# Patient Record
Sex: Male | Born: 1960 | Race: Black or African American | Hispanic: No | Marital: Single | Smoking: Former smoker
Health system: Southern US, Community
[De-identification: ages and names within clinical notes are randomized; demographics above are authoritative.]

## PROBLEM LIST (undated history)

## (undated) DIAGNOSIS — F329 Major depressive disorder, single episode, unspecified: Secondary | ICD-10-CM

## (undated) DIAGNOSIS — C189 Malignant neoplasm of colon, unspecified: Secondary | ICD-10-CM

## (undated) DIAGNOSIS — K219 Gastro-esophageal reflux disease without esophagitis: Secondary | ICD-10-CM

## (undated) DIAGNOSIS — I10 Essential (primary) hypertension: Secondary | ICD-10-CM

## (undated) DIAGNOSIS — F32A Depression, unspecified: Secondary | ICD-10-CM

## (undated) DIAGNOSIS — G4730 Sleep apnea, unspecified: Secondary | ICD-10-CM

## (undated) DIAGNOSIS — G629 Polyneuropathy, unspecified: Secondary | ICD-10-CM

## (undated) DIAGNOSIS — J45909 Unspecified asthma, uncomplicated: Secondary | ICD-10-CM

## (undated) DIAGNOSIS — J449 Chronic obstructive pulmonary disease, unspecified: Secondary | ICD-10-CM

## (undated) DIAGNOSIS — M25569 Pain in unspecified knee: Secondary | ICD-10-CM

## (undated) DIAGNOSIS — E669 Obesity, unspecified: Secondary | ICD-10-CM

## (undated) DIAGNOSIS — M549 Dorsalgia, unspecified: Secondary | ICD-10-CM

## (undated) DIAGNOSIS — R0600 Dyspnea, unspecified: Secondary | ICD-10-CM

## (undated) DIAGNOSIS — M1990 Unspecified osteoarthritis, unspecified site: Secondary | ICD-10-CM

## (undated) DIAGNOSIS — G8929 Other chronic pain: Secondary | ICD-10-CM

## (undated) HISTORY — PX: BOWEL RESECTION: SHX1257

## (undated) HISTORY — PX: ARTHROSCOPY WITH ANTERIOR CRUCIATE LIGAMENT (ACL) REPAIR WITH ANTERIOR TIBILIAS GRAFT: SHX6503

## (undated) HISTORY — DX: Malignant neoplasm of colon, unspecified: C18.9

## (undated) SURGERY — Surgical Case
Anesthesia: *Unknown

---

## 2014-08-05 ENCOUNTER — Ambulatory Visit: Payer: Self-pay | Admitting: Family Medicine

## 2014-08-18 ENCOUNTER — Ambulatory Visit: Payer: Self-pay | Admitting: Family Medicine

## 2015-06-06 ENCOUNTER — Encounter (HOSPITAL_COMMUNITY): Payer: Self-pay | Admitting: *Deleted

## 2015-06-06 ENCOUNTER — Emergency Department (HOSPITAL_COMMUNITY)
Admission: EM | Admit: 2015-06-06 | Discharge: 2015-06-06 | Disposition: A | Discharge status: Home / Self Care | Payer: Self-pay | Attending: Emergency Medicine | Admitting: Emergency Medicine

## 2015-06-06 DIAGNOSIS — Z720 Tobacco use: Secondary | ICD-10-CM | POA: Insufficient documentation

## 2015-06-06 DIAGNOSIS — K602 Anal fissure, unspecified: Secondary | ICD-10-CM | POA: Insufficient documentation

## 2015-06-06 MED ORDER — DOCUSATE SODIUM 100 MG PO CAPS: 100.0000 mg | ORAL_CAPSULE | Freq: Two times a day (BID) | ORAL | Status: DC

## 2015-06-06 MED ORDER — LIDOCAINE HCL 4 % EX SOLN
Freq: Three times a day (TID) | CUTANEOUS | Status: DC | PRN
  Administered 2015-06-06: 08:00:00 via TOPICAL
  Filled 2015-06-06: qty 50

## 2015-06-06 NOTE — ED Notes (Signed)
The pt has rectal pain after having a bm this am.  He felt swelling in his rectum when he wiped.  Difficulty walking

## 2015-06-06 NOTE — ED Provider Notes (Signed)
CSN: [id number]     Arrival date & time 06/06/15  2263 History   First MD Initiated Contact with Patient 06/06/15 [phone]     Chief Complaint  Patient presents with  . Rectal Pain     (Consider location/radiation/quality/duration/timing/severity/associated sxs/prior Treatment) HPI Complains of rectal pain onset yesterday, gradual pain is exacerbated by having bowel movement. He feels that his rectum is swollen. Pain is also worse with sitting improved when he takes pressure off the area. No other associated symptoms. No treatment prior to coming here. Denies blood per rectum no fever no abdominal pain no other associated symptoms . Denies blood per rectum no fever no abdominal pain no other associated symptoms History reviewed. No pertinent past medical history. past medical history colon cancer History reviewed. No pertinent past surgical history. No family history on file. Social History  Substance Use Topics  . Smoking status: Current Every Day Smoker  . Smokeless tobacco: None  . Alcohol Use: Yes    positive marijuana use Review of Systems  Gastrointestinal: Positive for rectal pain.      Allergies  Review of patient's allergies indicates no known allergies.  Home Medications   Prior to Admission medications   Not on File   BP 171/101 mmHg  Pulse 100  Temp(Src) 98.2 F (36.8 C) (Oral)  Resp 20  SpO2 95% Physical Exam  Constitutional: He appears well-developed and well-nourished. He appears distressed.  Appears uncomfortable  HENT:  Head: Normocephalic and atraumatic.  Eyes: Conjunctivae are normal. Pupils are equal, round, and reactive to light.  Neck: Neck supple. No tracheal deviation present. No thyromegaly present.  Cardiovascular: Normal rate and regular rhythm.   No murmur heard. Pulmonary/Chest: Effort normal and breath sounds normal.  Abdominal: Soft. Bowel sounds are normal. He exhibits no distension. There is no tenderness.  Genitourinary: Penis normal.   Anal fissure present at 12:00. Exquisitely tender. No surrounding redness or swelling  Musculoskeletal: Normal range of motion. He exhibits no edema or tenderness.  Neurological: He is alert. Coordination normal.  Skin: Skin is warm and dry. No rash noted.  Psychiatric: He has a normal mood and affect.  Nursing note and vitals reviewed.   ED Course  Procedures (including critical care time) Labs Review Labs Reviewed - No data to display  Imaging Review No results found. I have personally reviewed and evaluated these images and lab results as part of my medical decision-making.   EKG Interpretation None     7:50 AM feels much improved after treatment with topical lidocaine to anus. Feels ready to go home MDM  Plan referral Olinda, resource guide get primary care physician.  lidocaine bottle to go to use 3 times a day Final diagnoses:  None   prescription [facility] Diagnosis anal fissure     Orlie Dakin, MD 06/06/15 [phone]

## 2015-06-06 NOTE — ED Notes (Signed)
EDP at bedside  

## 2015-06-06 NOTE — Discharge Instructions (Signed)
Anal Fissure, Adult Apply lidocaine gel to your anus 3 times daily as needed for pain. Sit in warm bathtub 4 times daily for 30 minutes minutes at a time. Call the [place] or any of the numbers on the resource guide in 2 days to get a primary care physician and to be seen if not better by next week   An anal fissure is a small tear or crack in the skin around the anus. Bleeding from a fissure usually stops on its own within a few minutes. However, bleeding will often reoccur with each bowel movement until the crack heals.  CAUSES   Passing large, hard stools.  Frequent diarrheal stools.  Constipation.  Inflammatory bowel disease (Crohn's disease or ulcerative colitis).  Infections.  Anal sex. SYMPTOMS   Small amounts of blood seen on your stools, on toilet paper, or in the toilet after a bowel movement.  Rectal bleeding.  Painful bowel movements.  Itching or irritation around the anus. DIAGNOSIS Your caregiver will examine the anal area. An anal fissure can usually be seen with careful inspection. A rectal exam may be performed and a short tube (anoscope) may be used to examine the anal canal. TREATMENT   You may be instructed to take fiber supplements. These supplements can soften your stool to help make bowel movements easier.  Sitz baths may be recommended to help heal the tear. Do not use soap in the sitz baths.  A medicated cream or ointment may be prescribed to lessen discomfort. HOME CARE INSTRUCTIONS   Maintain a diet high in fruits, whole grains, and vegetables. Avoid constipating foods like bananas and dairy products.  Take sitz baths as directed by your caregiver.  Drink enough fluids to keep your urine clear or pale yellow.  Only take over-the-counter or prescription medicines for pain, discomfort, or fever as directed by your caregiver. Do not take aspirin as this may increase bleeding.  Do not use ointments containing  numbing medications (anesthetics) or hydrocortisone. They could slow healing. SEEK MEDICAL CARE IF:   Your fissure is not completely healed within 3 days.  You have further bleeding.  You have a fever.  You have diarrhea mixed with blood.  You have pain.  Your problem is getting worse rather than better. MAKE SURE YOU:   Understand these instructions.  Will watch your condition.  Will get help right away if you are not doing well or get worse. Document Released: 09/12/2005 Document Revised: 12/05/2011 Document Reviewed: 02/27/2011 [facility] Patient Information 2015 [place], [place]. This information is not intended to replace advice given to you by your health care provider. Make sure you discuss any questions you have with your health care provider.  Emergency Department Resource Guide 1) Find a Doctor and Pay Out of Pocket Although you won't have to find out who is covered by your insurance plan, it is a good idea to ask around and get recommendations. You will then need to call the office and see if the doctor you have chosen will accept you as a new patient and what types of options they offer for patients who are self-pay. Some doctors offer discounts or will set up payment plans for their patients who do not have insurance, but you will need to ask so you aren't surprised when you get to your appointment.  2) Contact Your Local Health Department Not all health departments have doctors that can see patients for sick visits, but many do, so it  is worth a call to see if yours does. If you don't know where your local health department is, you can check in your phone book. The CDC also has a tool to help you locate your state's health department, and many state websites also have listings of all of their local health departments.  3) Find a [facility] If your illness is not likely to be very severe or complicated, you may want to try a walk in clinic. These are popping up all over  the country in pharmacies, drugstores, and shopping centers. They're usually staffed by nurse practitioners or physician assistants that have been trained to treat common illnesses and complaints. They're usually fairly quick and inexpensive. However, if you have serious medical issues or chronic medical problems, these are probably not your best option.  No Primary Care Doctor: - Call Health Connect at  [phone] - they can help you locate a primary care doctor that  accepts your insurance, provides certain services, etc. - Physician Referral Service- [phone]  Chronic Pain Problems: Organization         Address  Phone   Notes  [facility]  [phone] Patients need to be referred by their primary care doctor.   Medication Assistance: Organization         Address  Phone   Notes  [facility] Medication [facility] [place] [place], [place] [address] [phone] --Must be a resident of [facility] -- Must have NO insurance coverage whatsoever (no Medicaid/ Medicare, etc.) -- The pt. MUST have a primary care doctor that directs their care regularly and follows them in the community   Med[facility]ist  [phone]   [employer]  [phone]    Agencies that provide inexpensive medical care: Organization         Address  Phone   Notes  [place]  [phone]   [facility]    [phone]   [facility] [place],  [address] [phone]   [place] [address], [place] [phone]   Planned Parenthood    [phone]   [facility]    [phone]   [place] and [place] [address], [place] Phone:  [phone], Fax:  [phone] Hours of Operation:  9 am - 6 pm, M-F.  Also accepts Medicaid/Medicare and self-pay.  [facility] [place],  [address], [place] Phone: [phone], Fax: [phone]. Hours of Operation:  8:30 am - 5:30 pm, M-F.  Also accepts Medicaid and self-pay.  [facility] [place] [address] [place] Phone: [phone]   [place], [place], [place] [phone], Ext. [phone] Mondays & Thursdays: 7-9 AM.  First 15 patients are seen on a first come, first serve basis.    [place] Providers:  Organization         Address  Phone   Notes  [facility] [address], [place] [phone] Also accepts self-pay patients.  [place], [place]  [phone]   [place], [address], [place] [phone]   [place] [address], [place] [phone]   Lucianne Lei [address]  [address], [place]   [phone] Only accepts [place] patients after they have their name applied to their card.   Self-Pay (no insurance) in [facility]:  Organization         Address  Phone   Notes  Sickle Cell Patients, [facility] [place] [phone]   [facility] Urgent Care [place] [phone]   [facility] [place]  [place], [place], [place] [phone]   [facility]/Dr. Osei-Bonsu  [address] [place] or [address], [place] [phone] Phone number for both [place] and [place] locations is the same.  Urgent Medical and [facility] [address], [place] [phone]   [facility] [address] [place] or [address] [phone]   [facility] [address] [place] [phone], phone; [phone], fax Sees patients 1st and 3rd Saturday of every month.   Must not qualify for public or private insurance (i.e. Medicaid, Medicare, [place] Health Choice, Veterans' Benefits)  Household income should be no more than 200% of the poverty level The clinic cannot treat you if you are pregnant or think you are pregnant  Sexually transmitted diseases are not treated at the clinic.    Dental Care: Organization         Address  Phone  Notes  [facility] Department of [facility] [place] [phone] Accepts children up to age 3 who are enrolled in [place] or [place]; pregnant women with a Medicaid card; and children who have applied for Medicaid or [place] Health Choice, but were declined, whose parents can pay a reduced fee at time of service.  [facility] Department of [facility]  [address], [place] [phone] Accepts children up to age 54 who are enrolled in [place] or [place]; pregnant women with a Medicaid card; and children who have applied for Medicaid or  Health Choice, but were declined, whose parents can pay a reduced fee at time of service.  [place] Adult Dental Access PROGRAM  [place] [phone] Patients are seen by appointment only. Walk-ins are not accepted. [place] will see patients 65 years of age and older. Monday - Tuesday (8am-5pm) Most Wednesdays (8:30-5pm) $30 per visit, cash only  [facility] Adult Dental Access PROGRAM  [address], [facility] [phone] Patients are seen by appointment only. Walk-ins are not accepted. [place] will see patients 61 years of age and older. One Wednesday Evening (Monthly: Volunteer Based).  $30 per visit, cash only  [place]  [phone] for adults; Children under age 23, call Graduate Pediatric Dentistry at [phone]. Children aged 43-14, please call [phone] to request a pediatric application.  Dental services are  provided in all areas of dental care including fillings, crowns and bridges, complete and partial dentures, implants, gum treatment, root canals, and extractions. Preventive care is also provided. Treatment is provided to both adults and children. Patients are selected via a lottery and there is often a waiting list.   [facility] [address], [place]  [phone] [email or web address]   [facility] [address] [place], [place] [phone], Ext. [phone] Second and Fourth Thursday  of each month, opens at 6:30 AM; Clinic ends at 9 AM.  Patients are seen on a first-come first-served basis, and a limited number are seen during each clinic.   [facility]  [address] Hillard Danker [place], [place] [phone]   Eligibility Requirements You must have lived in [place], [place], or [place] counties for at least the last three months.   You cannot be eligible for state or federal sponsored [employer], including [employer], [place], or [employer].   You generally cannot be eligible for healthcare insurance through your employer.    How to apply: Eligibility screenings are held every Tuesday and Wednesday afternoon from 1:00 pm until 4:00 pm. You do not need an appointment for the interview!  [facility] [address] [place], [place]   [place]  [place] Department  [place]  [phone]    Behavioral Health Resources in the Community: Intensive Outpatient Programs Organization         Address  Phone  Notes  [place]. [address] [place], [place] [phone]   [facility] Outpatient [address] [place], [place]   ADS: [facility] Drug [facility] [address] [place], [place]   [place] [address],  [place], [place] or [phone]   Substance Abuse Resources Organization         Address  Phone  Notes  Alcohol and Drug Services  [phone]   [place]  [phone]   [facility] [place]   Chinita Pester  [phone]   Residential & Outpatient Substance Abuse Program  [phone]   Psychological Services Organization         Address  Phone  Notes  [facility] [place]  [place]  [phone]   [place] [address] [place] or [phone]    Mobile Crisis Teams Organization         Address  Phone  Notes  [facility], Mobile Crisis Care Unit  [phone]   [facility]  [address] [place], [place]   [place] Levels [address], [place] [phone]    Self-Help/Support Groups Organization         Address  Phone             Notes  [place]. of [place] - variety of support groups  [place] Call for more information  Narcotics Anonymous (NA), [facility] [address], [employer] [place]  2 meetings at this location   Special educational needs teacher         Address  Phone  Notes  [facility] [place],    [place]  [phone]   [facility]  [address] [place] [address], [place], [place]   [place], [place] [phone] Admissions: 8am-3pm M-F  Incentives Substance [place] [address],    [place], [place] [phone]   [facility] [address] Jadene Pierini [place], [place]   The [place].,  [place], [place] - Intensive Outpatient [address] Kristeen [address], [place], [place]   [facility] ([place],  [place], [place] [phone] or  [phone]   [facility] Services (RTS) [address] [place], [place] Accepts Medicaid  [facility] [place] [address]  [place] [phone] Substance Abuse/Addiction Treatment   [facility] Organization         Address  Phone  Notes  [facility]  [phone]   Domenic Schwab, PhD [address] Arlis Porta [place], [place]   [phone] or [phone]   [place], [place] [phone]   [address], [place], [place] [phone] Insurance/Medicaid/sponsorship through [facility] and Families [address]                                    [place], [place] [phone] [place] [address], [place] [phone]    Dr. Adele Schilder  [phone]   [facility] Dept. 1) [address], [place] 2) [place] 3)  [place] [address], [place] [phone]  [phone]   [place] [phone] or [phone] (After Hours)

## 2015-08-25 DIAGNOSIS — J4540 Moderate persistent asthma, uncomplicated: Secondary | ICD-10-CM

## 2015-08-25 NOTE — Congregational Nurse Program (Signed)
Congregational Nurse Program Note  Date of Encounter: 08/25/2015  Past Medical History: No past medical history on file.  Encounter Details:     CNP Questionnaire - 08/25/15 1625    Patient Demographics   Is this a new or existing patient? New   Patient is considered a/an Not Applicable   Patient Assistance   Patient's financial/insurance status Uninsured;Low Income   Patient referred to apply for the following financial assistance [employer] insecurities addressed Provided food supplies   Transportation assistance No   Assistance securing medications No   Educational health offerings Not Applicable   Encounter Details   Primary purpose of visit Chronic Illness/Condition Visit   Was an Emergency Department visit averted? Not Applicable   Does patient have a medical provider? Yes   Patient referred to Clinic   Was a mental health screening completed? (GAINS tool) No   Does patient have dental issues? No   Since previous encounter, have you referred patient for abnormal blood pressure that resulted in a new diagnosis or medication change? No   Since previous encounter, have you referred patient for abnormal blood glucose that resulted in a new diagnosis or medication change? No   For Abstraction Use Only   Does patient have insurance? No    Clinic visit.  Patient asked if I could supply "cough syrup".   Informed him I could not.  States sees NP Audrea Muscat Placey at the [facility].  He had a "breathing treatment" this morning and will return to see her tomorrow.  Instructed him to discuss with her what she wanted to give him for the cough.

## 2015-09-01 DIAGNOSIS — I10 Essential (primary) hypertension: Secondary | ICD-10-CM

## 2015-09-07 NOTE — Congregational Nurse Program (Signed)
Congregational Nurse Program Note  Date of Encounter: 09/01/2015  Past Medical History: No past medical history on file.  Encounter Details:     CNP Questionnaire - 09/01/15 1425    Patient Demographics   Is this a new or existing patient? Existing   Patient is considered a/an Not Applicable   Patient Assistance   Patient's financial/insurance status Uninsured;Low Income   Patient referred to apply for the following financial assistance [employer] insecurities addressed Provided food supplies   Transportation assistance No   Assistance securing medications No   Educational health offerings Not Applicable   Encounter Details   Primary purpose of visit Chronic Illness/Condition Visit   Was an Emergency Department visit averted? Not Applicable   Does patient have a medical provider? Yes   Patient referred to Clinic   Was a mental health screening completed? (GAINS tool) No   Does patient have dental issues? No   Since previous encounter, have you referred patient for abnormal blood pressure that resulted in a new diagnosis or medication change? No   Since previous encounter, have you referred patient for abnormal blood glucose that resulted in a new diagnosis or medication change? No   For Abstraction Use Only   Does patient have insurance? No     Clinic visit as f/u from being seen by Marliss Coots [facility] at [facility].  He states he has been dx with bronchitis and that his B/P was "high" at that visit.  Instructed to return to clinic on the 8th for a re-check.  Has appointment with Ms. Synthia Innocent on the 12th.

## 2015-09-17 DIAGNOSIS — I10 Essential (primary) hypertension: Secondary | ICD-10-CM

## 2015-09-17 NOTE — Congregational Nurse Program (Signed)
Congregational Nurse Program Note  Date of Encounter: 09/17/2015  Past Medical History: No past medical history on file.  Encounter Details:     CNP Questionnaire - 09/17/15 1352    Patient Demographics   Is this a new or existing patient? Existing   Patient is considered a/an Not Applicable   Patient Assistance   Patient's financial/insurance status Uninsured;Low Income   Patient referred to apply for the following financial assistance [employer] insecurities addressed Provided food supplies   Transportation assistance No   Assistance securing medications Yes   Educational health offerings Not Applicable   Encounter Details   Primary purpose of visit Chronic Illness/Condition Visit;Navigating the Healthcare System   Was an Emergency Department visit averted? Not Applicable   Does patient have a medical provider? Yes   Patient referred to Clinic   Was a mental health screening completed? (GAINS tool) No   Does patient have dental issues? No   Since previous encounter, have you referred patient for abnormal blood pressure that resulted in a new diagnosis or medication change? No   Since previous encounter, have you referred patient for abnormal blood glucose that resulted in a new diagnosis or medication change? No   For Abstraction Use Only   Does patient have insurance? No    Clinic note:  Requested B/P check.  States still has not received medications from the [facility].  TC NP at the [facility].  She reported client had been seen in the clinic for asthma.  His elevated B/P is a new symptom and she was monitoring for one month to assess.  Client was instructed to apply for the medication assistance program which he has not followed through.  Client was to wait in the lobby until I talked with the NP to give him directions.  He had left the building when I went to talk to him.  He is to return to shelter clinic weekly for B/P check.  Results are to be reported to the NP at  the [facility].

## 2015-10-13 DIAGNOSIS — I10 Essential (primary) hypertension: Secondary | ICD-10-CM

## 2015-10-15 NOTE — Congregational Nurse Program (Signed)
Congregational Nurse Program Note  Date of Encounter: 10/13/2015  Past Medical History: No past medical history on file.  Encounter Details:     CNP Questionnaire - 10/13/15 1504    Patient Demographics   Is this a new or existing patient? Existing   Patient is considered a/an Not Applicable   Race Other   Patient Assistance   Location of Patient Assistance Not Applicable   Patient's financial/insurance status Self-Pay;Low Income   Uninsured Patient Yes   Interventions Counseled to make appt. with provider   Patient referred to apply for the following financial assistance [employer] insecurities addressed Provided food supplies   Transportation assistance No   Assistance securing medications Yes   Educational health offerings Cardiac disease   Encounter Details   Primary purpose of visit Chronic Illness/Condition Visit;Education/Health Concerns;Spiritual Care/Support Visit   Was an Emergency Department visit averted? Not Applicable   Does patient have a medical provider? Yes   Patient referred to [place]   Was a mental health screening completed? (GAINS tool) No   Does patient have dental issues? No   Since previous encounter, have you referred patient for abnormal blood pressure that resulted in a new diagnosis or medication change? No   Since previous encounter, have you referred patient for abnormal blood glucose that resulted in a new diagnosis or medication change? No   For Abstraction Use Only   Does patient have insurance? No       B/P check.  Still has not obtained medications.  States has an appointment with MAP to apply for medication assistance.  Encouraged him to keep that appointment

## 2015-11-26 DIAGNOSIS — I10 Essential (primary) hypertension: Secondary | ICD-10-CM

## 2015-12-01 NOTE — Congregational Nurse Program (Signed)
Congregational Nurse Program Note  Date of Encounter: 11/26/2015  Past Medical History: No past medical history on file.  Encounter Details:     CNP Questionnaire - 11/26/15 1439    Patient Demographics   Is this a new or existing patient? Existing   Patient is considered a/an Not Applicable   Race Other   Patient Assistance   Location of Patient Assistance Not Applicable   Patient's financial/insurance status Self-Pay;Low Income   Uninsured Patient Yes   Interventions Counseled to make appt. with provider;Assisted patient in making appt.   Patient referred to apply for the following financial assistance [employer] insecurities addressed Provided food supplies   Transportation assistance No   Assistance securing medications No   Educational health offerings Cardiac disease   Encounter Details   Primary purpose of visit Chronic Illness/Condition Visit;Education/Health Concerns;Spiritual Care/Support Visit   Was an Emergency Department visit averted? Not Applicable   Does patient have a medical provider? Yes   Patient referred to Clinic   Was a mental health screening completed? (GAINS tool) No   Does patient have dental issues? No   Does patient have vision issues? No   Since previous encounter, have you referred patient for abnormal blood pressure that resulted in a new diagnosis or medication change? No   Since previous encounter, have you referred patient for abnormal blood glucose that resulted in a new diagnosis or medication change? No   For Abstraction Use Only   Does patient have insurance? No       Client is seeing Marliss Coots RN NP at the [facility].  Directives given by Ms Synthia Innocent for client to [facility] weekly for B/P check.  Results will be monitored for one month.  Client instructed.

## 2015-12-08 DIAGNOSIS — I10 Essential (primary) hypertension: Secondary | ICD-10-CM

## 2015-12-08 NOTE — Congregational Nurse Program (Signed)
Congregational Nurse Program Note  Date of Encounter: 12/08/2015  Past Medical History: No past medical history on file.  Encounter Details:     CNP Questionnaire - 12/08/15 1530    Patient Demographics   Is this a new or existing patient? Existing   Patient is considered a/an Not Applicable   Race Other   Patient Assistance   Location of Patient Assistance Not Applicable   Patient's financial/insurance status Self-Pay;Low Income   Uninsured Patient Yes   Interventions Counseled to make appt. with provider;Assisted patient in making appt.   Patient referred to apply for the following financial assistance [employer] insecurities addressed Provided food supplies   Transportation assistance No   Assistance securing medications No   Educational health offerings Cardiac disease   Encounter Details   Primary purpose of visit Chronic Illness/Condition Visit;Education/Health Concerns;Spiritual Care/Support Visit   Was an Emergency Department visit averted? Not Applicable   Does patient have a medical provider? Yes   Patient referred to Clinic   Was a mental health screening completed? (GAINS tool) No   Does patient have dental issues? No   Does patient have vision issues? No   Since previous encounter, have you referred patient for abnormal blood pressure that resulted in a new diagnosis or medication change? No   Since previous encounter, have you referred patient for abnormal blood glucose that resulted in a new diagnosis or medication change? No   For Abstraction Use Only   Does patient have insurance? No       B/P check  128/78.  Much improved.  Taking blood pressure medication

## 2016-01-19 DIAGNOSIS — Z0000 Encounter for general adult medical examination without abnormal findings: Secondary | ICD-10-CM

## 2016-01-19 NOTE — Congregational Nurse Program (Signed)
Congregational Nurse Program Note  Date of Encounter: 01/19/2016  Past Medical History: No past medical history on file.  Encounter Details:     CNP Questionnaire - 01/19/16 2227    Patient Demographics   Is this a new or existing patient? New   Race African-American/Black   Patient Assistance   Location of Patient Assistance Not Applicable   Patient's financial/insurance status [employer]   Uninsured Patient Yes   Interventions Counseled to make appt. with provider   Patient referred to apply for the following financial assistance Not Applicable   Food insecurities addressed Not Applicable   Transportation assistance No   Assistance securing medications No   Educational health offerings Navigating the healthcare system   Encounter Details   Primary purpose of visit Acute Illness/Condition Visit;Spiritual Care/Support Visit;Education/Health Concerns   Was an Emergency Department visit averted? Not Applicable   Does patient have a medical provider? Yes   Patient referred to [place];Follow up with established PCP   Was a mental health screening completed? (GAINS tool) No   Does patient have dental issues? No   Does patient have vision issues? No   Since previous encounter, have you referred patient for abnormal blood pressure that resulted in a new diagnosis or medication change? No   Since previous encounter, have you referred patient for abnormal blood glucose that resulted in a new diagnosis or medication change? No   For Abstraction Use Only   Does patient have insurance? No    Initial  Visit  for his  Client who  [facility] at [facility]. Concerned about ringing in his  Ears . States  He  [facility] from NP at  [facility]. Client will  Go on tomorrow for check of his ears..Wgt 226 lbs.

## 2016-02-15 ENCOUNTER — Encounter (HOSPITAL_COMMUNITY): Payer: Self-pay | Admitting: Emergency Medicine

## 2016-02-15 ENCOUNTER — Emergency Department (HOSPITAL_COMMUNITY): Payer: Self-pay

## 2016-02-15 ENCOUNTER — Emergency Department (HOSPITAL_COMMUNITY)
Admission: EM | Admit: 2016-02-15 | Discharge: 2016-02-16 | Disposition: A | Discharge status: Home / Self Care | Payer: Self-pay | Attending: Emergency Medicine | Admitting: Emergency Medicine

## 2016-02-15 DIAGNOSIS — J45901 Unspecified asthma with (acute) exacerbation: Secondary | ICD-10-CM | POA: Insufficient documentation

## 2016-02-15 DIAGNOSIS — R0600 Dyspnea, unspecified: Secondary | ICD-10-CM

## 2016-02-15 DIAGNOSIS — I10 Essential (primary) hypertension: Secondary | ICD-10-CM | POA: Insufficient documentation

## 2016-02-15 DIAGNOSIS — R140 Abdominal distension (gaseous): Secondary | ICD-10-CM | POA: Insufficient documentation

## 2016-02-15 DIAGNOSIS — F17200 Nicotine dependence, unspecified, uncomplicated: Secondary | ICD-10-CM | POA: Insufficient documentation

## 2016-02-15 DIAGNOSIS — R0789 Other chest pain: Secondary | ICD-10-CM | POA: Insufficient documentation

## 2016-02-15 DIAGNOSIS — R1013 Epigastric pain: Secondary | ICD-10-CM | POA: Insufficient documentation

## 2016-02-15 DIAGNOSIS — Z79899 Other long term (current) drug therapy: Secondary | ICD-10-CM | POA: Insufficient documentation

## 2016-02-15 HISTORY — DX: Unspecified asthma, uncomplicated: J45.909

## 2016-02-15 HISTORY — DX: Essential (primary) hypertension: I10

## 2016-02-15 LAB — COMPREHENSIVE METABOLIC PANEL
ALK PHOS: 77 U/L (ref 38–126)
ALT: 32 U/L (ref 17–63)
ANION GAP: 10 (ref 5–15)
AST: 29 U/L (ref 15–41)
Albumin: 3.8 g/dL (ref 3.5–5.0)
BILIRUBIN TOTAL: 0.4 mg/dL (ref 0.3–1.2)
BUN: 9 mg/dL (ref 6–20)
CALCIUM: 9.1 mg/dL (ref 8.9–10.3)
CO2: 22 mmol/L (ref 22–32)
CREATININE: 0.98 mg/dL (ref 0.61–1.24)
Chloride: 106 mmol/L (ref 101–111)
Glucose, Bld: 115 mg/dL — ABNORMAL HIGH (ref 65–99)
Potassium: 3.7 mmol/L (ref 3.5–5.1)
Sodium: 138 mmol/L (ref 135–145)
TOTAL PROTEIN: 7.1 g/dL (ref 6.5–8.1)

## 2016-02-15 LAB — I-STAT TROPONIN, ED: Troponin i, poc: 0 ng/mL (ref 0.00–0.08)

## 2016-02-15 LAB — CBC
HEMATOCRIT: 37.4 % — AB (ref 39.0–52.0)
HEMOGLOBIN: 12 g/dL — AB (ref 13.0–17.0)
MCH: 27.8 pg (ref 26.0–34.0)
MCHC: 32.1 g/dL (ref 30.0–36.0)
MCV: 86.6 fL (ref 78.0–100.0)
Platelets: 256 10*3/uL (ref 150–400)
RBC: 4.32 MIL/uL (ref 4.22–5.81)
RDW: 13.6 % (ref 11.5–15.5)
WBC: 9.7 10*3/uL (ref 4.0–10.5)

## 2016-02-15 MED ORDER — AZITHROMYCIN 250 MG PO TABS: 500.0000 mg | ORAL_TABLET | Freq: Once | ORAL | Status: DC

## 2016-02-15 MED ORDER — ONDANSETRON HCL 4 MG/2ML IJ SOLN
4.0000 mg | Freq: Once | INTRAMUSCULAR | Status: AC
  Administered 2016-02-15: 4 mg via INTRAVENOUS
  Filled 2016-02-15: qty 2

## 2016-02-15 MED ORDER — IBUPROFEN 200 MG PO TABS
600.0000 mg | ORAL_TABLET | Freq: Once | ORAL | Status: AC
  Administered 2016-02-15: 600 mg via ORAL
  Filled 2016-02-15: qty 1

## 2016-02-15 MED ORDER — ALBUTEROL SULFATE (2.5 MG/3ML) 0.083% IN NEBU
5.0000 mg | INHALATION_SOLUTION | Freq: Once | RESPIRATORY_TRACT | Status: AC
  Administered 2016-02-15: 5 mg via RESPIRATORY_TRACT
  Filled 2016-02-15: qty 6

## 2016-02-15 MED ORDER — METHYLPREDNISOLONE SODIUM SUCC 125 MG IJ SOLR: 125.0000 mg | Freq: Once | INTRAMUSCULAR | Status: DC

## 2016-02-15 MED ORDER — SODIUM CHLORIDE 0.9 % IV BOLUS (SEPSIS): 1000.0000 mL | Freq: Once | INTRAVENOUS | Status: DC

## 2016-02-15 MED ORDER — IOPAMIDOL (ISOVUE-370) INJECTION 76%
INTRAVENOUS | Status: AC
  Administered 2016-02-16: 100 mL
  Filled 2016-02-15: qty 100

## 2016-02-15 MED ORDER — AMOXICILLIN-POT CLAVULANATE 875-125 MG PO TABS: 1.0000 | ORAL_TABLET | Freq: Once | ORAL | Status: DC

## 2016-02-15 NOTE — ED Notes (Signed)
Patient transported to X-ray 

## 2016-02-15 NOTE — ED Notes (Signed)
Pt presents from home with [facility] for SOB x 3 days along with LEFT sided rib pain, diaphoresis, chills, and yellow sputum production; EMS reports being called out earlier today where one neb tx was administered and patient refused transport; pt called 911 again stating he did not feel better; pt received 2 albuterol nebs and 1 atrovent neb enroute by EMS and 125mg  solumedrol; pt hx of HTN with BP today of 180/120; pt reports improved WOB at this time

## 2016-02-15 NOTE — ED Notes (Signed)
Walked patient in the hallway to the bathroom in [place] and back...oxygen maintain at 92% and pulse 90-92

## 2016-02-15 NOTE — ED Provider Notes (Signed)
CSN: [id number]     Arrival date & time 02/15/16  1949 History   First MD Initiated Contact with Patient 02/15/16 1951     Chief Complaint  Patient presents with  . Shortness of Breath   HPI Comments: EMS saw this AM, felt better with neb.  Seen again and given solumedrol 125mg  and atrovent. Feeling better.    Patient is a 55 y.o. male presenting with shortness of breath.  Shortness of Breath Severity:  Moderate Onset quality:  Gradual Duration:  3 days Timing:  Constant Progression:  Worsening Chronicity:  New Context: URI   Context comment:  Nasal congestion, sore throat Relieved by: albuterol. Worsened by:  Activity and coughing Associated symptoms: chest pain (left chest wall, worse with palpation and cough. no exertional component), cough, diaphoresis, fever (subjective), sore throat, sputum production (yellow) and wheezing   Associated symptoms: no abdominal pain, no hemoptysis, no PND, no rash and no vomiting   Risk factors: obesity and tobacco use   Risk factors: no hx of cancer and no hx of PE/DVT   Risk factors comment:  HTN, asthma   Past Medical History  Diagnosis Date  . Hypertension   . Asthma    History reviewed. No pertinent past surgical history. History reviewed. No pertinent family history. Social History  Substance Use Topics  . Smoking status: Current Every Day Smoker  . Smokeless tobacco: None  . Alcohol Use: Yes    Review of Systems  Constitutional: Positive for fever (subjective), chills and diaphoresis. Negative for activity change and appetite change.  HENT: Positive for congestion, rhinorrhea and sore throat. Negative for trouble swallowing and voice change.   Respiratory: Positive for cough, sputum production (yellow), shortness of breath and wheezing. Negative for hemoptysis.   Cardiovascular: Positive for chest pain (left chest wall, worse with palpation and cough. no exertional component). Negative for PND.  Gastrointestinal: Negative for  nausea, vomiting and abdominal pain.  Genitourinary: Negative for dysuria and flank pain.  Musculoskeletal: Positive for myalgias. Negative for back pain.  Skin: Negative for rash.  Neurological: Negative for syncope.  Psychiatric/Behavioral: Negative for confusion.  All other systems reviewed and are negative.   Allergies  Review of patient's allergies indicates no known allergies.  Home Medications   Prior to Admission medications   Medication Sig Start Date End Date Taking? Authorizing Provider  amoxicillin-clavulanate (AUGMENTIN) 875-125 MG tablet Take 1 tablet by mouth 2 (two) times daily. 02/16/16 02/23/16  Tammy Sours, MD  azithromycin (ZITHROMAX) 250 MG tablet Take 1 tablet (250 mg total) by mouth daily. 02/16/16 02/21/16  Tammy Sours, MD  docusate sodium (COLACE) 100 MG capsule Take 1 capsule (100 mg total) by mouth every 12 (twelve) hours. 06/06/15   Orlie Dakin, MD   BP 139/90 mmHg  Pulse 78  Temp(Src) 98.6 F (37 C) (Oral)  Resp 19  SpO2 83% Physical Exam  Constitutional: He is oriented to person, place, and time. He appears well-developed and well-nourished. No distress.  Speaking in full sentences. Not diaphoretic but shirt is moist  HENT:  Head: Normocephalic and atraumatic.  Nose: Nose normal.  Eyes: Conjunctivae are normal.  Neck: Normal range of motion. Neck supple. No JVD present. No tracheal deviation present.  Cardiovascular: Normal rate, regular rhythm and normal heart sounds.   No murmur heard. Pulmonary/Chest: Effort normal. No respiratory distress. He has wheezes (rare insp and exp diffsuely). He has rales (left > right). He exhibits tenderness (reproduces chest pain).  Abdominal: Soft. Bowel sounds are  normal. He exhibits distension. He exhibits no mass. There is tenderness. There is no rebound and no guarding.  Musculoskeletal: Normal range of motion. He exhibits no edema.  No lower extremity edema, calf tenderness, warmth, erythema or palpable cords    Neurological: He is alert and oriented to person, place, and time.  Skin: Skin is warm and dry. No rash noted.  Psychiatric: He has a normal mood and affect.  Nursing note and vitals reviewed.   ED Course  Procedures (including critical care time) Labs Review Labs Reviewed  CBC - Abnormal; Notable for the following:    Hemoglobin 12.0 (*)    HCT 37.4 (*)    All other components within normal limits  COMPREHENSIVE METABOLIC PANEL - Abnormal; Notable for the following:    Glucose, Bld 115 (*)    All other components within normal limits  I-STAT TROPOININ, ED    Imaging Review Dg Chest 2 View  02/15/2016  CLINICAL DATA:  Cough, chest pain and left side rib pain since Saturday. Hx controlled HTN and asthma. EXAM: CHEST  2 VIEW COMPARISON:  None. FINDINGS: Midline trachea. Mild cardiomegaly. Mediastinal contours otherwise within normal limits. No pleural effusion or pneumothorax. Low lung volumes. Bibasilar volume loss with patchy airspace disease. Right hemidiaphragm elevation. Prominent right upper quadrant gas is likely within the hepatic flexure of colon. IMPRESSION: Hypoventilation with bibasilar volume loss and patchy airspace disease, favoring atelectasis. If symptoms warrant or persist, consider short term radiographic follow-up to exclude less likely pneumonia. Electronically Signed   By: Abigail Miyamoto M.D.   On: 02/15/2016 21:55   Ct Angio Chest Pe W/cm &/or Wo Cm  02/16/2016  CLINICAL DATA:  Hypoxia, chest pain and shortness of breath for 2 days. Abdominal pain. EXAM: CT ANGIOGRAPHY CHEST CT ABDOMEN AND PELVIS WITH CONTRAST TECHNIQUE: Multidetector CT imaging of the chest was performed using the standard protocol during bolus administration of intravenous contrast. Multiplanar CT image reconstructions and MIPs were obtained to evaluate the vascular anatomy. Multidetector CT imaging of the abdomen and pelvis was performed using the standard protocol during bolus administration of  intravenous contrast. CONTRAST:  Two contrast injections to optimize assessment of the pulmonary arteries. 120 cc Isovue 370 total. COMPARISON:  Chest radiographs earlier this day. FINDINGS: CTA CHEST FINDINGS There are no filling defects within the pulmonary arteries to suggest pulmonary embolus. Normal caliber thoracic aorta without dissection. Small mediastinal and hilar lymph nodes not enlarged by size criteria. Scattered coronary artery calcifications. No pericardial effusion. Moderate emphysema. Central bronchial thickening. Clustered small nodular opacities in the anterior right upper lobe measuring 2 mm. No pulmonary mass. There are no acute or suspicious osseous abnormalities. CT ABDOMEN and PELVIS FINDINGS No focal hepatic lesion. Gallbladder is physiologically distended without stone. No peripancreatic inflammation or ductal dilatation. The spleen, adrenal glands, and kidneys are unremarkable. Small hiatal hernia. Stomach minimally distended with fluid. Fluid distending duodenum without wall thickening. There are no dilated or thickened bowel loops. Multiple enteric sutures in the colon. Gaseous distention of the right and proximal transverse colon with small volume of stool. Detailed bowel evaluation limited secondary to lack of enteric contrast. No colonic wall thickening. The appendix is not seen. No free air, free fluid, or intra-abdominal fluid collection. In the pelvis the bladder is minimally distended without wall thickening. Prominent sized prostate gland. No pelvic free fluid. There are no acute or suspicious osseous abnormalities. Review of the MIP images confirms the above findings. IMPRESSION: CT CHEST IMPRESSION: 1. No pulmonary  embolus. 2. Moderate emphysema and central bronchial thickening. Bronchial thickening of uncertain acuity. 3. Clustered a small nodular opacities in the right upper lobe measuring 2 mm, have the appearance of infectious or inflammatory etiology. Given presence of  emphysema, however recommend follow-up. Non-contrast chest CT can be considered in 12 months if patient is high-risk. This recommendation follows the consensus statement: Guidelines for Management of Incidental Pulmonary Nodules Detected on CT Images:From the Fleischner Society 2017; published online before print (10.1148/radiol.IJ:2314499). CT ABDOMEN/PELVIS IMPRESSION: 1. Gaseous distention of colon in the right abdomen, no obstruction or wall thickening. Small stool throughout the remainder of colon with multiple colonic enteric chain sutures. 2. Otherwise no acute abnormality in the abdomen/pelvis. Electronically Signed   By: Jeb Levering M.D.   On: 02/16/2016 04:02   Ct Abdomen Pelvis W Contrast  02/16/2016  CLINICAL DATA:  Hypoxia, chest pain and shortness of breath for 2 days. Abdominal pain. EXAM: CT ANGIOGRAPHY CHEST CT ABDOMEN AND PELVIS WITH CONTRAST TECHNIQUE: Multidetector CT imaging of the chest was performed using the standard protocol during bolus administration of intravenous contrast. Multiplanar CT image reconstructions and MIPs were obtained to evaluate the vascular anatomy. Multidetector CT imaging of the abdomen and pelvis was performed using the standard protocol during bolus administration of intravenous contrast. CONTRAST:  Two contrast injections to optimize assessment of the pulmonary arteries. 120 cc Isovue 370 total. COMPARISON:  Chest radiographs earlier this day. FINDINGS: CTA CHEST FINDINGS There are no filling defects within the pulmonary arteries to suggest pulmonary embolus. Normal caliber thoracic aorta without dissection. Small mediastinal and hilar lymph nodes not enlarged by size criteria. Scattered coronary artery calcifications. No pericardial effusion. Moderate emphysema. Central bronchial thickening. Clustered small nodular opacities in the anterior right upper lobe measuring 2 mm. No pulmonary mass. There are no acute or suspicious osseous abnormalities. CT ABDOMEN  and PELVIS FINDINGS No focal hepatic lesion. Gallbladder is physiologically distended without stone. No peripancreatic inflammation or ductal dilatation. The spleen, adrenal glands, and kidneys are unremarkable. Small hiatal hernia. Stomach minimally distended with fluid. Fluid distending duodenum without wall thickening. There are no dilated or thickened bowel loops. Multiple enteric sutures in the colon. Gaseous distention of the right and proximal transverse colon with small volume of stool. Detailed bowel evaluation limited secondary to lack of enteric contrast. No colonic wall thickening. The appendix is not seen. No free air, free fluid, or intra-abdominal fluid collection. In the pelvis the bladder is minimally distended without wall thickening. Prominent sized prostate gland. No pelvic free fluid. There are no acute or suspicious osseous abnormalities. Review of the MIP images confirms the above findings. IMPRESSION: CT CHEST IMPRESSION: 1. No pulmonary embolus. 2. Moderate emphysema and central bronchial thickening. Bronchial thickening of uncertain acuity. 3. Clustered a small nodular opacities in the right upper lobe measuring 2 mm, have the appearance of infectious or inflammatory etiology. Given presence of emphysema, however recommend follow-up. Non-contrast chest CT can be considered in 12 months if patient is high-risk. This recommendation follows the consensus statement: Guidelines for Management of Incidental Pulmonary Nodules Detected on CT Images:From the Fleischner Society 2017; published online before print (10.1148/radiol.IJ:2314499). CT ABDOMEN/PELVIS IMPRESSION: 1. Gaseous distention of colon in the right abdomen, no obstruction or wall thickening. Small stool throughout the remainder of colon with multiple colonic enteric chain sutures. 2. Otherwise no acute abnormality in the abdomen/pelvis. Electronically Signed   By: Jeb Levering M.D.   On: 02/16/2016 04:02   I have personally  reviewed and evaluated  these images and lab results as part of my medical decision-making.   EKG Interpretation   Date/Time:  Monday Feb 15 2016 20:32:05 EDT Ventricular Rate:  89 PR Interval:  152 QRS Duration: 91 QT Interval:  304 QTC Calculation: 370 R Axis:   44 Text Interpretation:  Sinus rhythm S1,S2,S3 pattern Probable anteroseptal  infarct, old No previous ECGs available Confirmed by YAO  MD, DAVID  ([id number]) on 02/15/2016 9:15:08 PM Also confirmed by Darl Householder  MD, DAVID ([id number]),  editor Gilford Rile, CCT, [place] ([id number])  on 02/16/2016 7:28:55 AM      MDM   Final diagnoses:  Dyspnea  Epigastric pain  Chest wall pain    Vital signs stable. Not hypoxic. Positive sputum cough subjective chills and URI-like symptoms. Suspect resp illness.  Will obtain CXR>  Chest x-ray with bibasilar atelectasis.  Large bubble in RUQ, free air? Not definitive on XR. Albuterol has not helped.    EKG wo ischemic changes or right heart strain.  Trop neg.    10:38 PM repeat exam and hx.  O2 sats 90.  Diaphoretic.  He now better described the chest pain as midepigastric sternal and vague. Free whether shortness of breath is related to diaphragmatic elevation. Given the saturations without clear source we'll obtain CT PE study as well as abdomen and pelvis to further evaluate. He believes his abdomen is more distended than normal. Last bowel movement this morning. Has not passed flatus since this event.  Dyspnea could be related to diaphragmatic compression seen on CXR>   Signed out to Dr Dina Rich at [facility] pending CT imaging.  His O2 sats are now appropriate.  Ambulated in ED without desaturation.    Tammy Sours, MD 02/16/16 [place] Yao, MD 02/17/16 Tresa Moore

## 2016-02-16 ENCOUNTER — Emergency Department (HOSPITAL_COMMUNITY): Payer: Self-pay

## 2016-02-16 ENCOUNTER — Encounter (HOSPITAL_COMMUNITY): Payer: Self-pay | Admitting: Adult Health

## 2016-02-16 DIAGNOSIS — Y9389 Activity, other specified: Secondary | ICD-10-CM | POA: Insufficient documentation

## 2016-02-16 DIAGNOSIS — F17200 Nicotine dependence, unspecified, uncomplicated: Secondary | ICD-10-CM | POA: Insufficient documentation

## 2016-02-16 DIAGNOSIS — Y998 Other external cause status: Secondary | ICD-10-CM | POA: Insufficient documentation

## 2016-02-16 DIAGNOSIS — I10 Essential (primary) hypertension: Secondary | ICD-10-CM | POA: Insufficient documentation

## 2016-02-16 DIAGNOSIS — X58XXXA Exposure to other specified factors, initial encounter: Secondary | ICD-10-CM | POA: Insufficient documentation

## 2016-02-16 DIAGNOSIS — J45909 Unspecified asthma, uncomplicated: Secondary | ICD-10-CM | POA: Insufficient documentation

## 2016-02-16 DIAGNOSIS — M940 Chondrocostal junction syndrome [Tietze]: Secondary | ICD-10-CM | POA: Insufficient documentation

## 2016-02-16 DIAGNOSIS — Y9289 Other specified places as the place of occurrence of the external cause: Secondary | ICD-10-CM | POA: Insufficient documentation

## 2016-02-16 MED ORDER — IOPAMIDOL (ISOVUE-370) INJECTION 76%
INTRAVENOUS | Status: DC
Start: 2016-02-16 — End: 2016-02-16
  Filled 2016-02-16: qty 100

## 2016-02-16 MED ORDER — AZITHROMYCIN 250 MG PO TABS: 250.0000 mg | ORAL_TABLET | Freq: Every day | ORAL | Status: AC

## 2016-02-16 MED ORDER — AMOXICILLIN-POT CLAVULANATE 875-125 MG PO TABS
1.0000 | ORAL_TABLET | Freq: Two times a day (BID) | ORAL | Status: AC
Start: 2016-02-16 — End: 2016-02-23

## 2016-02-16 NOTE — ED Provider Notes (Signed)
Patient signed out pending CT scan. CT scan negative for acute process.patient does have multiple patient does have multiple nodular opacities in the right upper lobe which could be infectious or inflammatory. Will discharge home on antibiotics. Repeat CT scan recommended in 12 months. This was discussed with the patient.  Patient was able to ambulate and maintain pulse ox.  Results for orders placed or performed during the hospital encounter of 02/15/16  CBC  Result Value Ref Range   WBC 9.7 4.0 - 10.5 K/uL   RBC 4.32 4.22 - 5.81 MIL/uL   Hemoglobin 12.0 (L) 13.0 - 17.0 g/dL   HCT 37.4 (L) 39.0 - 52.0 %   MCV 86.6 78.0 - 100.0 fL   MCH 27.8 26.0 - 34.0 pg   MCHC 32.1 30.0 - 36.0 g/dL   RDW 13.6 11.5 - 15.5 %   Platelets 256 150 - 400 K/uL  Comprehensive metabolic panel  Result Value Ref Range   Sodium 138 135 - 145 mmol/L   Potassium 3.7 3.5 - 5.1 mmol/L   Chloride 106 101 - 111 mmol/L   CO2 22 22 - 32 mmol/L   Glucose, Bld 115 (H) 65 - 99 mg/dL   BUN 9 6 - 20 mg/dL   Creatinine, Ser 0.98 0.61 - 1.24 mg/dL   Calcium 9.1 8.9 - 10.3 mg/dL   Total Protein 7.1 6.5 - 8.1 g/dL   Albumin 3.8 3.5 - 5.0 g/dL   AST 29 15 - 41 U/L   ALT 32 17 - 63 U/L   Alkaline Phosphatase 77 38 - 126 U/L   Total Bilirubin 0.4 0.3 - 1.2 mg/dL   GFR calc non Af Amer >60 >60 mL/min   GFR calc Af Amer >60 >60 mL/min   Anion gap 10 5 - 15  I-Stat Troponin, ED (not at [facility])  Result Value Ref Range   Troponin i, poc 0.00 0.00 - 0.08 ng/mL   Comment 3           Dg Chest 2 View  02/15/2016  CLINICAL DATA:  Cough, chest pain and left side rib pain since Saturday. Hx controlled HTN and asthma. EXAM: CHEST  2 VIEW COMPARISON:  None. FINDINGS: Midline trachea. Mild cardiomegaly. Mediastinal contours otherwise within normal limits. No pleural effusion or pneumothorax. Low lung volumes. Bibasilar volume loss with patchy airspace disease. Right hemidiaphragm elevation. Prominent right upper quadrant gas is likely  within the hepatic flexure of colon. IMPRESSION: Hypoventilation with bibasilar volume loss and patchy airspace disease, favoring atelectasis. If symptoms warrant or persist, consider short term radiographic follow-up to exclude less likely pneumonia. Electronically Signed   By: Abigail Miyamoto M.D.   On: 02/15/2016 21:55   Ct Angio Chest Pe W/cm &/or Wo Cm  02/16/2016  CLINICAL DATA:  Hypoxia, chest pain and shortness of breath for 2 days. Abdominal pain. EXAM: CT ANGIOGRAPHY CHEST CT ABDOMEN AND PELVIS WITH CONTRAST TECHNIQUE: Multidetector CT imaging of the chest was performed using the standard protocol during bolus administration of intravenous contrast. Multiplanar CT image reconstructions and MIPs were obtained to evaluate the vascular anatomy. Multidetector CT imaging of the abdomen and pelvis was performed using the standard protocol during bolus administration of intravenous contrast. CONTRAST:  Two contrast injections to optimize assessment of the pulmonary arteries. 120 cc Isovue 370 total. COMPARISON:  Chest radiographs earlier this day. FINDINGS: CTA CHEST FINDINGS There are no filling defects within the pulmonary arteries to suggest pulmonary embolus. Normal caliber thoracic aorta without dissection. Small mediastinal  and hilar lymph nodes not enlarged by size criteria. Scattered coronary artery calcifications. No pericardial effusion. Moderate emphysema. Central bronchial thickening. Clustered small nodular opacities in the anterior right upper lobe measuring 2 mm. No pulmonary mass. There are no acute or suspicious osseous abnormalities. CT ABDOMEN and PELVIS FINDINGS No focal hepatic lesion. Gallbladder is physiologically distended without stone. No peripancreatic inflammation or ductal dilatation. The spleen, adrenal glands, and kidneys are unremarkable. Small hiatal hernia. Stomach minimally distended with fluid. Fluid distending duodenum without wall thickening. There are no dilated or thickened  bowel loops. Multiple enteric sutures in the colon. Gaseous distention of the right and proximal transverse colon with small volume of stool. Detailed bowel evaluation limited secondary to lack of enteric contrast. No colonic wall thickening. The appendix is not seen. No free air, free fluid, or intra-abdominal fluid collection. In the pelvis the bladder is minimally distended without wall thickening. Prominent sized prostate gland. No pelvic free fluid. There are no acute or suspicious osseous abnormalities. Review of the MIP images confirms the above findings. IMPRESSION: CT CHEST IMPRESSION: 1. No pulmonary embolus. 2. Moderate emphysema and central bronchial thickening. Bronchial thickening of uncertain acuity. 3. Clustered a small nodular opacities in the right upper lobe measuring 2 mm, have the appearance of infectious or inflammatory etiology. Given presence of emphysema, however recommend follow-up. Non-contrast chest CT can be considered in 12 months if patient is high-risk. This recommendation follows the consensus statement: Guidelines for Management of Incidental Pulmonary Nodules Detected on CT Images:From the Fleischner Society 2017; published online before print (10.1148/radiol.IJ:2314499). CT ABDOMEN/PELVIS IMPRESSION: 1. Gaseous distention of colon in the right abdomen, no obstruction or wall thickening. Small stool throughout the remainder of colon with multiple colonic enteric chain sutures. 2. Otherwise no acute abnormality in the abdomen/pelvis. Electronically Signed   By: Jeb Levering M.D.   On: 02/16/2016 04:02   Ct Abdomen Pelvis W Contrast  02/16/2016  CLINICAL DATA:  Hypoxia, chest pain and shortness of breath for 2 days. Abdominal pain. EXAM: CT ANGIOGRAPHY CHEST CT ABDOMEN AND PELVIS WITH CONTRAST TECHNIQUE: Multidetector CT imaging of the chest was performed using the standard protocol during bolus administration of intravenous contrast. Multiplanar CT image reconstructions and  MIPs were obtained to evaluate the vascular anatomy. Multidetector CT imaging of the abdomen and pelvis was performed using the standard protocol during bolus administration of intravenous contrast. CONTRAST:  Two contrast injections to optimize assessment of the pulmonary arteries. 120 cc Isovue 370 total. COMPARISON:  Chest radiographs earlier this day. FINDINGS: CTA CHEST FINDINGS There are no filling defects within the pulmonary arteries to suggest pulmonary embolus. Normal caliber thoracic aorta without dissection. Small mediastinal and hilar lymph nodes not enlarged by size criteria. Scattered coronary artery calcifications. No pericardial effusion. Moderate emphysema. Central bronchial thickening. Clustered small nodular opacities in the anterior right upper lobe measuring 2 mm. No pulmonary mass. There are no acute or suspicious osseous abnormalities. CT ABDOMEN and PELVIS FINDINGS No focal hepatic lesion. Gallbladder is physiologically distended without stone. No peripancreatic inflammation or ductal dilatation. The spleen, adrenal glands, and kidneys are unremarkable. Small hiatal hernia. Stomach minimally distended with fluid. Fluid distending duodenum without wall thickening. There are no dilated or thickened bowel loops. Multiple enteric sutures in the colon. Gaseous distention of the right and proximal transverse colon with small volume of stool. Detailed bowel evaluation limited secondary to lack of enteric contrast. No colonic wall thickening. The appendix is not seen. No free air, free fluid, or intra-abdominal  fluid collection. In the pelvis the bladder is minimally distended without wall thickening. Prominent sized prostate gland. No pelvic free fluid. There are no acute or suspicious osseous abnormalities. Review of the MIP images confirms the above findings. IMPRESSION: CT CHEST IMPRESSION: 1. No pulmonary embolus. 2. Moderate emphysema and central bronchial thickening. Bronchial thickening of  uncertain acuity. 3. Clustered a small nodular opacities in the right upper lobe measuring 2 mm, have the appearance of infectious or inflammatory etiology. Given presence of emphysema, however recommend follow-up. Non-contrast chest CT can be considered in 12 months if patient is high-risk. This recommendation follows the consensus statement: Guidelines for Management of Incidental Pulmonary Nodules Detected on CT Images:From the Fleischner Society 2017; published online before print (10.1148/radiol.IJ:2314499). CT ABDOMEN/PELVIS IMPRESSION: 1. Gaseous distention of colon in the right abdomen, no obstruction or wall thickening. Small stool throughout the remainder of colon with multiple colonic enteric chain sutures. 2. Otherwise no acute abnormality in the abdomen/pelvis. Electronically Signed   By: Jeb Levering M.D.   On: 02/16/2016 04:02      Merryl Hacker, MD 02/16/16 [phone]

## 2016-02-16 NOTE — ED Notes (Addendum)
Presents with left sided rib pain after a coughing spell that occurred at 8:30 this evening, at that time he felt a pop and then pain. -pt reports that he is unable to lay down because left side hurts and is worse with movement. HSats 100% RA, breath sounds clear in all fields.  If pt is still he denies pain.

## 2016-02-17 ENCOUNTER — Emergency Department (HOSPITAL_COMMUNITY)
Admission: EM | Admit: 2016-02-17 | Discharge: 2016-02-17 | Disposition: A | Discharge status: Home / Self Care | Payer: Self-pay | Attending: Emergency Medicine | Admitting: Emergency Medicine

## 2016-02-17 DIAGNOSIS — M940 Chondrocostal junction syndrome [Tietze]: Secondary | ICD-10-CM

## 2016-02-17 MED ORDER — OXYCODONE-ACETAMINOPHEN 5-325 MG PO TABS
1.0000 | ORAL_TABLET | Freq: Once | ORAL | Status: AC
  Administered 2016-02-17: 1 via ORAL
  Filled 2016-02-17: qty 1

## 2016-02-17 MED ORDER — NAPROXEN 500 MG PO TABS: 500.0000 mg | ORAL_TABLET | Freq: Two times a day (BID) | ORAL | Status: DC

## 2016-02-17 MED ORDER — IBUPROFEN 800 MG PO TABS
800.0000 mg | ORAL_TABLET | Freq: Once | ORAL | Status: AC
  Administered 2016-02-17: 800 mg via ORAL
  Filled 2016-02-17: qty 1

## 2016-02-17 MED ORDER — CYCLOBENZAPRINE HCL 10 MG PO TABS: 5.0000 mg | ORAL_TABLET | Freq: Two times a day (BID) | ORAL | Status: DC | PRN

## 2016-02-17 NOTE — ED Provider Notes (Signed)
CSN: [id number]     Arrival date & time 02/16/16  2216 History  By signing my name below, I, Altamease Oiler, attest that this documentation has been prepared under the direction and in the presence of Delos Haring, PA-C Electronically Signed: Altamease Oiler, ED Scribe. 02/17/2016 1:25 AM   Chief Complaint  Patient presents with  . Chest Pain   The history is provided by the patient. No language interpreter was used.   Gerron Bireley is a 55 y.o. male with PMHx of HTN and asthma who presents to the Emergency Department complaining of new, constant, 10/10 in severity, left lateral rib pain with sudden onset approximately 5 hours ago after coughing. He was seen in the ED yesterday for SOB and notes that "something", possibly air, was seen on his scans. He was not started on any medication at that time. Earlier tonight he was coughing and notes that he felt a "pop" at the onset of pain. The pain is exacerbated by movement, laying on the left side, moving his left arm and palpation.  Pt denies fever, nausea, worsened cough, vomiting, diarrhea, and LE swelling.  Past Medical History  Diagnosis Date  . Hypertension   . Asthma    History reviewed. No pertinent past surgical history. History reviewed. No pertinent family history. Social History  Substance Use Topics  . Smoking status: Current Every Day Smoker  . Smokeless tobacco: None  . Alcohol Use: Yes    Review of Systems  10 Systems reviewed and all are negative for acute change except as noted in the HPI.  Allergies  Review of patient's allergies indicates no known allergies.  Home Medications   Prior to Admission medications   Medication Sig Start Date End Date Taking? Authorizing Provider  amoxicillin-clavulanate (AUGMENTIN) 875-125 MG tablet Take 1 tablet by mouth 2 (two) times daily. 02/16/16 02/23/16  Tammy Sours, MD  azithromycin (ZITHROMAX) 250 MG tablet Take 1 tablet (250 mg total) by mouth daily. 02/16/16 02/21/16  Tammy Sours, MD  docusate sodium (COLACE) 100 MG capsule Take 1 capsule (100 mg total) by mouth every 12 (twelve) hours. Patient not taking: Reported on 02/17/2016 06/06/15   Orlie Dakin, MD   BP 175/116 mmHg  Pulse 104  Temp(Src) 98.4 F (36.9 C) (Oral)  SpO2 97% Physical Exam  Constitutional: He is oriented to person, place, and time. He appears well-developed and well-nourished.  HENT:  Head: Normocephalic and atraumatic.  Eyes: EOM are normal.  Neck: Normal range of motion.  Cardiovascular: Normal rate, regular rhythm, normal heart sounds and intact distal pulses.   Pulmonary/Chest: Effort normal and breath sounds normal. No respiratory distress. He exhibits tenderness.  Pinpoint tenderness over the left lateral rib that is reproducible.   Abdominal: Soft. He exhibits no distension. There is no tenderness.  Musculoskeletal: Normal range of motion.  Neurological: He is alert and oriented to person, place, and time.  Skin: Skin is warm and dry.  Psychiatric: He has a normal mood and affect. Judgment normal.  Nursing note and vitals reviewed.   ED Course  Procedures (including critical care time) DIAGNOSTIC STUDIES: Oxygen Saturation is 97% on RA,  normal by my interpretation.    COORDINATION OF CARE: 1:23 AM Discussed treatment plan which includes CXR and pain medication with pt at bedside and pt agreed to plan.  Labs Review Labs Reviewed - No data to display  Imaging Review Dg Chest 2 View  02/16/2016  CLINICAL DATA:  Left-sided rib pain. Cough, felt something pop  in the chest. EXAM: CHEST  2 VIEW COMPARISON:  Chest radiograph yesterday. CT of the chest abdomen pelvis earlier this day at 0041 hour FINDINGS: Improved aeration from prior radiograph. The cardiomediastinal contours are normal. Mild bronchial thickening. Pulmonary vasculature is normal. The small right lung nodular opacities on CT are not seen radiographically. No consolidation, pleural effusion, or pneumothorax. No  acute osseous abnormalities are seen. Elevation of left hemidiaphragm secondary to gaseous distention of bowel in the upper abdomen, characterized on CT 22 hours prior. IMPRESSION: Central bronchial thickening.  No pneumothorax or consolidation. Electronically Signed   By: Jeb Levering M.D.   On: 02/16/2016 22:53   Dg Chest 2 View  02/15/2016  CLINICAL DATA:  Cough, chest pain and left side rib pain since Saturday. Hx controlled HTN and asthma. EXAM: CHEST  2 VIEW COMPARISON:  None. FINDINGS: Midline trachea. Mild cardiomegaly. Mediastinal contours otherwise within normal limits. No pleural effusion or pneumothorax. Low lung volumes. Bibasilar volume loss with patchy airspace disease. Right hemidiaphragm elevation. Prominent right upper quadrant gas is likely within the hepatic flexure of colon. IMPRESSION: Hypoventilation with bibasilar volume loss and patchy airspace disease, favoring atelectasis. If symptoms warrant or persist, consider short term radiographic follow-up to exclude less likely pneumonia. Electronically Signed   By: Abigail Miyamoto M.D.   On: 02/15/2016 21:55   Ct Angio Chest Pe W/cm &/or Wo Cm  02/16/2016  CLINICAL DATA:  Hypoxia, chest pain and shortness of breath for 2 days. Abdominal pain. EXAM: CT ANGIOGRAPHY CHEST CT ABDOMEN AND PELVIS WITH CONTRAST TECHNIQUE: Multidetector CT imaging of the chest was performed using the standard protocol during bolus administration of intravenous contrast. Multiplanar CT image reconstructions and MIPs were obtained to evaluate the vascular anatomy. Multidetector CT imaging of the abdomen and pelvis was performed using the standard protocol during bolus administration of intravenous contrast. CONTRAST:  Two contrast injections to optimize assessment of the pulmonary arteries. 120 cc Isovue 370 total. COMPARISON:  Chest radiographs earlier this day. FINDINGS: CTA CHEST FINDINGS There are no filling defects within the pulmonary arteries to suggest  pulmonary embolus. Normal caliber thoracic aorta without dissection. Small mediastinal and hilar lymph nodes not enlarged by size criteria. Scattered coronary artery calcifications. No pericardial effusion. Moderate emphysema. Central bronchial thickening. Clustered small nodular opacities in the anterior right upper lobe measuring 2 mm. No pulmonary mass. There are no acute or suspicious osseous abnormalities. CT ABDOMEN and PELVIS FINDINGS No focal hepatic lesion. Gallbladder is physiologically distended without stone. No peripancreatic inflammation or ductal dilatation. The spleen, adrenal glands, and kidneys are unremarkable. Small hiatal hernia. Stomach minimally distended with fluid. Fluid distending duodenum without wall thickening. There are no dilated or thickened bowel loops. Multiple enteric sutures in the colon. Gaseous distention of the right and proximal transverse colon with small volume of stool. Detailed bowel evaluation limited secondary to lack of enteric contrast. No colonic wall thickening. The appendix is not seen. No free air, free fluid, or intra-abdominal fluid collection. In the pelvis the bladder is minimally distended without wall thickening. Prominent sized prostate gland. No pelvic free fluid. There are no acute or suspicious osseous abnormalities. Review of the MIP images confirms the above findings. IMPRESSION: CT CHEST IMPRESSION: 1. No pulmonary embolus. 2. Moderate emphysema and central bronchial thickening. Bronchial thickening of uncertain acuity. 3. Clustered a small nodular opacities in the right upper lobe measuring 2 mm, have the appearance of infectious or inflammatory etiology. Given presence of emphysema, however recommend follow-up. Non-contrast chest  CT can be considered in 12 months if patient is high-risk. This recommendation follows the consensus statement: Guidelines for Management of Incidental Pulmonary Nodules Detected on CT Images:From the Fleischner Society  2017; published online before print (10.1148/radiol.IJ:2314499). CT ABDOMEN/PELVIS IMPRESSION: 1. Gaseous distention of colon in the right abdomen, no obstruction or wall thickening. Small stool throughout the remainder of colon with multiple colonic enteric chain sutures. 2. Otherwise no acute abnormality in the abdomen/pelvis. Electronically Signed   By: Jeb Levering M.D.   On: 02/16/2016 04:02   Ct Abdomen Pelvis W Contrast  02/16/2016  CLINICAL DATA:  Hypoxia, chest pain and shortness of breath for 2 days. Abdominal pain. EXAM: CT ANGIOGRAPHY CHEST CT ABDOMEN AND PELVIS WITH CONTRAST TECHNIQUE: Multidetector CT imaging of the chest was performed using the standard protocol during bolus administration of intravenous contrast. Multiplanar CT image reconstructions and MIPs were obtained to evaluate the vascular anatomy. Multidetector CT imaging of the abdomen and pelvis was performed using the standard protocol during bolus administration of intravenous contrast. CONTRAST:  Two contrast injections to optimize assessment of the pulmonary arteries. 120 cc Isovue 370 total. COMPARISON:  Chest radiographs earlier this day. FINDINGS: CTA CHEST FINDINGS There are no filling defects within the pulmonary arteries to suggest pulmonary embolus. Normal caliber thoracic aorta without dissection. Small mediastinal and hilar lymph nodes not enlarged by size criteria. Scattered coronary artery calcifications. No pericardial effusion. Moderate emphysema. Central bronchial thickening. Clustered small nodular opacities in the anterior right upper lobe measuring 2 mm. No pulmonary mass. There are no acute or suspicious osseous abnormalities. CT ABDOMEN and PELVIS FINDINGS No focal hepatic lesion. Gallbladder is physiologically distended without stone. No peripancreatic inflammation or ductal dilatation. The spleen, adrenal glands, and kidneys are unremarkable. Small hiatal hernia. Stomach minimally distended with fluid. Fluid  distending duodenum without wall thickening. There are no dilated or thickened bowel loops. Multiple enteric sutures in the colon. Gaseous distention of the right and proximal transverse colon with small volume of stool. Detailed bowel evaluation limited secondary to lack of enteric contrast. No colonic wall thickening. The appendix is not seen. No free air, free fluid, or intra-abdominal fluid collection. In the pelvis the bladder is minimally distended without wall thickening. Prominent sized prostate gland. No pelvic free fluid. There are no acute or suspicious osseous abnormalities. Review of the MIP images confirms the above findings. IMPRESSION: CT CHEST IMPRESSION: 1. No pulmonary embolus. 2. Moderate emphysema and central bronchial thickening. Bronchial thickening of uncertain acuity. 3. Clustered a small nodular opacities in the right upper lobe measuring 2 mm, have the appearance of infectious or inflammatory etiology. Given presence of emphysema, however recommend follow-up. Non-contrast chest CT can be considered in 12 months if patient is high-risk. This recommendation follows the consensus statement: Guidelines for Management of Incidental Pulmonary Nodules Detected on CT Images:From the Fleischner Society 2017; published online before print (10.1148/radiol.IJ:2314499).   CT ABDOMEN/PELVIS IMPRESSION: 1. Gaseous distention of colon in the right abdomen, no obstruction or wall thickening. Small stool throughout the remainder of colon with multiple colonic enteric chain sutures. 2. Otherwise no acute abnormality in the abdomen/pelvis. Electronically Signed   By: Jeb Levering M.D.   On: 02/16/2016 04:02   I have personally reviewed and evaluated these images as part of my medical decision-making.   EKG Interpretation   Date/Time:  Wednesday Feb 17 2016 01:39:14 EDT Ventricular Rate:  81 PR Interval:  148 QRS Duration: 94 QT Interval:  378 QTC Calculation: 439 R Axis:  91 Text  Interpretation:  Sinus rhythm Borderline right axis deviation  Borderline T wave abnormalities No acute changes Confirmed by Kathrynn Humble,  MD, ANKIT [phone]) on 02/17/2016 1:59:04 AM      MDM   Final diagnoses:  Costochondritis   IMPRESSION: Central bronchial thickening.  No pneumothorax or consolidation. Electronically Signed   By: Jeb Levering M.D.   On: 02/16/2016 22:53   The patients symptoms are very MSK in nature. He had an EKG and chest xray. Chest xray improved from yesterday morning and EKG is unchanged. Discussed case with Dr. Kathrynn Humble who agrees treatment will be symptomatic and that work-up is sufficient. Will start on anti-inflammatory medication, provide reassurance, and some pain medication from break through pain.   I personally performed the services described in this documentation, which was scribed in my presence. The recorded information has been reviewed and is accurate.    Delos Haring, PA-C 02/17/16 [id number]  Varney Biles, MD 02/17/16 [phone]

## 2016-02-17 NOTE — Discharge Instructions (Signed)

## 2016-03-03 ENCOUNTER — Emergency Department (HOSPITAL_COMMUNITY)
Admission: EM | Admit: 2016-03-03 | Discharge: 2016-03-03 | Disposition: A | Discharge status: Home / Self Care | Payer: Self-pay | Attending: Emergency Medicine | Admitting: Emergency Medicine

## 2016-03-03 ENCOUNTER — Encounter (HOSPITAL_COMMUNITY): Payer: Self-pay | Admitting: *Deleted

## 2016-03-03 ENCOUNTER — Emergency Department (HOSPITAL_COMMUNITY): Payer: Self-pay

## 2016-03-03 DIAGNOSIS — Z79899 Other long term (current) drug therapy: Secondary | ICD-10-CM | POA: Insufficient documentation

## 2016-03-03 DIAGNOSIS — I10 Essential (primary) hypertension: Secondary | ICD-10-CM | POA: Insufficient documentation

## 2016-03-03 DIAGNOSIS — J40 Bronchitis, not specified as acute or chronic: Secondary | ICD-10-CM | POA: Insufficient documentation

## 2016-03-03 DIAGNOSIS — R0781 Pleurodynia: Secondary | ICD-10-CM | POA: Insufficient documentation

## 2016-03-03 LAB — CBC WITH DIFFERENTIAL/PLATELET
Basophils Absolute: 0 10*3/uL (ref 0.0–0.1)
Basophils Relative: 0 %
EOS ABS: 1.1 10*3/uL — AB (ref 0.0–0.7)
EOS PCT: 17 %
HCT: 38.7 % — ABNORMAL LOW (ref 39.0–52.0)
Hemoglobin: 13 g/dL (ref 13.0–17.0)
LYMPHS ABS: 2.2 10*3/uL (ref 0.7–4.0)
LYMPHS PCT: 32 %
MCH: 28.3 pg (ref 26.0–34.0)
MCHC: 33.6 g/dL (ref 30.0–36.0)
MCV: 84.3 fL (ref 78.0–100.0)
MONO ABS: 0.4 10*3/uL (ref 0.1–1.0)
MONOS PCT: 6 %
Neutro Abs: 3.1 10*3/uL (ref 1.7–7.7)
Neutrophils Relative %: 45 %
PLATELETS: 350 10*3/uL (ref 150–400)
RBC: 4.59 MIL/uL (ref 4.22–5.81)
RDW: 13.7 % (ref 11.5–15.5)
WBC: 6.8 10*3/uL (ref 4.0–10.5)

## 2016-03-03 LAB — BASIC METABOLIC PANEL
Anion gap: 7 (ref 5–15)
BUN: 17 mg/dL (ref 6–20)
CHLORIDE: 105 mmol/L (ref 101–111)
CO2: 25 mmol/L (ref 22–32)
CREATININE: 0.85 mg/dL (ref 0.61–1.24)
Calcium: 9.3 mg/dL (ref 8.9–10.3)
GFR calc Af Amer: >60 mL/min (ref >60)
GLUCOSE: 115 mg/dL — AB (ref 65–99)
POTASSIUM: 3.8 mmol/L (ref 3.5–5.1)
SODIUM: 137 mmol/L (ref 135–145)

## 2016-03-03 LAB — I-STAT TROPONIN, ED: TROPONIN I, POC: 0 ng/mL (ref 0.00–0.08)

## 2016-03-03 MED ORDER — ALBUTEROL SULFATE HFA 108 (90 BASE) MCG/ACT IN AERS
2.0000 | INHALATION_SPRAY | RESPIRATORY_TRACT | Status: DC | PRN
  Filled 2016-03-03: qty 6.7

## 2016-03-03 MED ORDER — PREDNISONE 20 MG PO TABS: ORAL_TABLET | ORAL | Status: DC

## 2016-03-03 MED ORDER — BENZONATATE 100 MG PO CAPS: 100.0000 mg | ORAL_CAPSULE | Freq: Three times a day (TID) | ORAL | Status: DC

## 2016-03-03 NOTE — Discharge Instructions (Signed)
Take the prescribed medication as directed.  You may continue to have some soreness for a few days, this is likely from your coughing. Use the albuterol inhaler we gave your for wheezing/shorntess of breath. Follow-up with your primary care doctor. Return to the ED for new or worsening symptoms.

## 2016-03-03 NOTE — ED Notes (Signed)
Per EMS, pt complains of left rib pain since coughing last night. Pt states pain is worse with movement and coughing. Pt states he has has cough, congestion for the past 3 weeks. Breath sounds diminished in left lower lobes for EMS.

## 2016-03-03 NOTE — ED Provider Notes (Signed)
CSN: [id number]     Arrival date & time 03/03/16  1222 History   First MD Initiated Contact with Patient 03/03/16 1300     Chief Complaint  Patient presents with  . Cough  . Flank Pain     (Consider location/radiation/quality/duration/timing/severity/associated sxs/prior Treatment) The history is provided by the patient and medical records.    55 year old male with history of hypertension and asthma, presenting to the ED for left rib pain. Patient reports he got into a "coughing fit" last night and has had left rib pain since.  States he has had a productive cough with yellow phlegm for the past 2 weeks.  Denies chest pain or SOB.  States he does have increased pain in the left ribs with deep breathing.  No fever or chills.  No recent sick contacts. Patient has no known cardiac history. He is an occasional smoker.States he did recently take some antibiotics, cannot remember what they are.  VSS.  Past Medical History  Diagnosis Date  . Hypertension   . Asthma    History reviewed. No pertinent past surgical history. No family history on file. Social History  Substance Use Topics  . Smoking status: Current Every Day Smoker  . Smokeless tobacco: None  . Alcohol Use: Yes    Review of Systems  Respiratory: Positive for cough.   Cardiovascular: Positive for chest pain (left rib).  All other systems reviewed and are negative.     Allergies  Review of patient's allergies indicates no known allergies.  Home Medications   Prior to Admission medications   Medication Sig Start Date End Date Taking? Authorizing Provider  cyclobenzaprine (FLEXERIL) 10 MG tablet Take 0.5-1 tablets (5-10 mg total) by mouth 2 (two) times daily as needed. 02/17/16   Tiffany Carlota Raspberry, PA-C  docusate sodium (COLACE) 100 MG capsule Take 1 capsule (100 mg total) by mouth every 12 (twelve) hours. Patient not taking: Reported on 02/17/2016 06/06/15   Orlie Dakin, MD  naproxen (NAPROSYN) 500 MG tablet Take 1 tablet  (500 mg total) by mouth 2 (two) times daily. 02/17/16   Tiffany Carlota Raspberry, PA-C   BP 130/83 mmHg  Pulse 84  Temp(Src) 97.4 F (36.3 C) (Oral)  Resp 18  SpO2 95%   Physical Exam  Constitutional: He is oriented to person, place, and time. He appears well-developed and well-nourished. No distress.  HENT:  Head: Normocephalic and atraumatic.  Mouth/Throat: Oropharynx is clear and moist.  Eyes: Conjunctivae and EOM are normal. Pupils are equal, round, and reactive to light.  Neck: Normal range of motion. Neck supple.  Cardiovascular: Normal rate, regular rhythm and normal heart sounds.   Pulmonary/Chest: Effort normal and breath sounds normal. No respiratory distress. He has no wheezes.  Tenderness of left lateral lower ribs, no breathing or deformities noted, no crepitus or flail segment, lungs clear, no distress, speaking in full sentences without difficulty  Abdominal: Soft. Bowel sounds are normal. There is no tenderness. There is no guarding.  Musculoskeletal: Normal range of motion. He exhibits no edema.  Neurological: He is alert and oriented to person, place, and time.  Skin: Skin is warm and dry. He is not diaphoretic.  Psychiatric: He has a normal mood and affect.  Nursing note and vitals reviewed.   ED Course  Procedures (including critical care time) Labs Review Labs Reviewed  CBC WITH DIFFERENTIAL/PLATELET - Abnormal; Notable for the following:    HCT 38.7 (*)    Eosinophils Absolute 1.1 (*)    All other components  within normal limits  BASIC METABOLIC PANEL - Abnormal; Notable for the following:    Glucose, Bld 115 (*)    All other components within normal limits  I-STAT TROPOININ, ED    Imaging Review Dg Chest 2 View  03/03/2016  CLINICAL DATA:  Left rib pain, coughing EXAM: CHEST  2 VIEW COMPARISON:  02/16/2016 FINDINGS: Bilateral interstitial thickening and peribronchial cuffing most concerning for bronchitis. There is no focal parenchymal opacity. There is no  pleural effusion or pneumothorax. The heart and mediastinal contours are unremarkable. The osseous structures are unremarkable. IMPRESSION: Bilateral interstitial thickening and peribronchial cuffing most concerning for bronchitis. Electronically Signed   By: Kathreen Devoid   On: 03/03/2016 13:56   I have personally reviewed and evaluated these images and lab results as part of my medical decision-making.   EKG Interpretation None      MDM   Final diagnoses:  Bronchitis  Rib pain on left side   55 y.o. M here with left rib pain.  Seen for similar complaints in the ED on 02/15/16 as well as 02/17/16.  He was placed on courses of augmentin and azithromycin, states he completed both but continues to have cough.  Patient is afebrile, nontoxic. He has tenderness of his left lower ribs without acute deformity. His lungs are overall clear. His vital signs are stable on room air. Labwork is reassuring. Chest x-ray with bronchitic changes. Patient has completed 2 courses of antibiotics without improvement, suspect this is likely viral in nature.  Will start on steroids, albuterol PRN, tessalon for cough.  Follow-up with PCP.  Discussed plan with patient, he/she acknowledged understanding and agreed with plan of care.  Return precautions given for new or worsening symptoms.  Larene Pickett, PA-C 03/03/16 [place], MD 03/04/16 [phone]

## 2016-03-27 ENCOUNTER — Emergency Department (HOSPITAL_COMMUNITY)
Admission: EM | Admit: 2016-03-27 | Discharge: 2016-03-27 | Disposition: A | Discharge status: Home / Self Care | Payer: Self-pay | Attending: Emergency Medicine | Admitting: Emergency Medicine

## 2016-03-27 ENCOUNTER — Emergency Department (HOSPITAL_COMMUNITY): Payer: Self-pay

## 2016-03-27 ENCOUNTER — Encounter (HOSPITAL_COMMUNITY): Payer: Self-pay | Admitting: Emergency Medicine

## 2016-03-27 DIAGNOSIS — Z85038 Personal history of other malignant neoplasm of large intestine: Secondary | ICD-10-CM | POA: Insufficient documentation

## 2016-03-27 DIAGNOSIS — J45909 Unspecified asthma, uncomplicated: Secondary | ICD-10-CM | POA: Insufficient documentation

## 2016-03-27 DIAGNOSIS — L03317 Cellulitis of buttock: Secondary | ICD-10-CM

## 2016-03-27 DIAGNOSIS — F17200 Nicotine dependence, unspecified, uncomplicated: Secondary | ICD-10-CM | POA: Insufficient documentation

## 2016-03-27 DIAGNOSIS — I10 Essential (primary) hypertension: Secondary | ICD-10-CM | POA: Insufficient documentation

## 2016-03-27 DIAGNOSIS — L0231 Cutaneous abscess of buttock: Secondary | ICD-10-CM | POA: Insufficient documentation

## 2016-03-27 LAB — CBC WITH DIFFERENTIAL/PLATELET
BASOS ABS: 0 10*3/uL (ref 0.0–0.1)
Basophils Relative: 0 %
EOS ABS: 0.1 10*3/uL (ref 0.0–0.7)
EOS PCT: 1 %
HCT: 38.4 % — ABNORMAL LOW (ref 39.0–52.0)
Hemoglobin: 12.5 g/dL — ABNORMAL LOW (ref 13.0–17.0)
LYMPHS ABS: 1.5 10*3/uL (ref 0.7–4.0)
LYMPHS PCT: 12 %
MCH: 27.6 pg (ref 26.0–34.0)
MCHC: 32.6 g/dL (ref 30.0–36.0)
MCV: 84.8 fL (ref 78.0–100.0)
MONO ABS: 1 10*3/uL (ref 0.1–1.0)
Monocytes Relative: 8 %
Neutro Abs: 9.9 10*3/uL — ABNORMAL HIGH (ref 1.7–7.7)
Neutrophils Relative %: 79 %
PLATELETS: 334 10*3/uL (ref 150–400)
RBC: 4.53 MIL/uL (ref 4.22–5.81)
RDW: 13 % (ref 11.5–15.5)
WBC: 12.5 10*3/uL — AB (ref 4.0–10.5)

## 2016-03-27 LAB — COMPREHENSIVE METABOLIC PANEL
ALBUMIN: 3 g/dL — AB (ref 3.5–5.0)
ALT: 34 U/L (ref 17–63)
AST: 23 U/L (ref 15–41)
Alkaline Phosphatase: 103 U/L (ref 38–126)
Anion gap: 8 (ref 5–15)
BILIRUBIN TOTAL: 0.4 mg/dL (ref 0.3–1.2)
BUN: 11 mg/dL (ref 6–20)
CO2: 31 mmol/L (ref 22–32)
CREATININE: 0.9 mg/dL (ref 0.61–1.24)
Calcium: 9.6 mg/dL (ref 8.9–10.3)
Chloride: 96 mmol/L — ABNORMAL LOW (ref 101–111)
GFR calc Af Amer: >60 mL/min (ref >60)
GLUCOSE: 127 mg/dL — AB (ref 65–99)
POTASSIUM: 3.4 mmol/L — AB (ref 3.5–5.1)
Sodium: 135 mmol/L (ref 135–145)
TOTAL PROTEIN: 7.5 g/dL (ref 6.5–8.1)

## 2016-03-27 LAB — URINALYSIS, ROUTINE W REFLEX MICROSCOPIC
BILIRUBIN URINE: NEGATIVE
GLUCOSE, UA: NEGATIVE mg/dL
Hgb urine dipstick: NEGATIVE
KETONES UR: NEGATIVE mg/dL
Leukocytes, UA: NEGATIVE
Nitrite: NEGATIVE
PH: 6.5 (ref 5.0–8.0)
PROTEIN: NEGATIVE mg/dL
Specific Gravity, Urine: 1.041 — ABNORMAL HIGH (ref 1.005–1.030)

## 2016-03-27 LAB — I-STAT CG4 LACTIC ACID, ED: LACTIC ACID, VENOUS: 1.72 mmol/L (ref 0.5–1.9)

## 2016-03-27 MED ORDER — CIPROFLOXACIN IN D5W 400 MG/200ML IV SOLN
400.0000 mg | Freq: Once | INTRAVENOUS | Status: AC
  Administered 2016-03-27: 400 mg via INTRAVENOUS
  Filled 2016-03-27: qty 200

## 2016-03-27 MED ORDER — METRONIDAZOLE IN NACL 5-0.79 MG/ML-% IV SOLN
500.0000 mg | Freq: Once | INTRAVENOUS | Status: AC
  Administered 2016-03-27: 500 mg via INTRAVENOUS
  Filled 2016-03-27: qty 100

## 2016-03-27 MED ORDER — HYDROCODONE-ACETAMINOPHEN 5-325 MG PO TABS: 1.0000 | ORAL_TABLET | ORAL | Status: DC | PRN

## 2016-03-27 MED ORDER — HYDROMORPHONE HCL 1 MG/ML IJ SOLN
1.0000 mg | Freq: Once | INTRAMUSCULAR | Status: AC
  Administered 2016-03-27: 1 mg via INTRAVENOUS
  Filled 2016-03-27: qty 1

## 2016-03-27 MED ORDER — SODIUM CHLORIDE 0.9 % IV SOLN
INTRAVENOUS | Status: DC
  Administered 2016-03-27: 10:00:00 via INTRAVENOUS

## 2016-03-27 MED ORDER — IOPAMIDOL (ISOVUE-300) INJECTION 61%
INTRAVENOUS | Status: AC
  Administered 2016-03-27: 100 mL
  Filled 2016-03-27: qty 100

## 2016-03-27 MED ORDER — IOPAMIDOL (ISOVUE-300) INJECTION 61%
INTRAVENOUS | Status: AC
  Filled 2016-03-27: qty 100

## 2016-03-27 MED ORDER — LIDOCAINE HCL (PF) 1 % IJ SOLN
INTRAMUSCULAR | Status: AC
  Filled 2016-03-27: qty 30

## 2016-03-27 MED ORDER — FENTANYL CITRATE (PF) 100 MCG/2ML IJ SOLN
100.0000 ug | Freq: Once | INTRAMUSCULAR | Status: AC
  Administered 2016-03-27: 100 ug via INTRAVENOUS
  Filled 2016-03-27: qty 2

## 2016-03-27 MED ORDER — LIDOCAINE HCL (PF) 1 % IJ SOLN
5.0000 mL | Freq: Once | INTRAMUSCULAR | Status: AC
  Administered 2016-03-27: 5 mL via INTRADERMAL

## 2016-03-27 MED ORDER — DOCUSATE SODIUM 100 MG PO CAPS: 100.0000 mg | ORAL_CAPSULE | Freq: Two times a day (BID) | ORAL | Status: DC

## 2016-03-27 MED ORDER — AMOXICILLIN-POT CLAVULANATE 875-125 MG PO TABS: 1.0000 | ORAL_TABLET | Freq: Two times a day (BID) | ORAL | Status: DC

## 2016-03-27 NOTE — ED Notes (Signed)
Pt aware of need for urine sample.  

## 2016-03-27 NOTE — ED Notes (Signed)
Patient able to ambulate independently  

## 2016-03-27 NOTE — ED Notes (Signed)
Surgery at bedside.

## 2016-03-27 NOTE — Discharge Instructions (Signed)

## 2016-03-27 NOTE — ED Notes (Signed)
C/o swelling to groin and R buttocks since Wednesday.  States he believes he has an abscess.

## 2016-03-27 NOTE — Op Note (Signed)
   11:55 AM  PATIENT:  Benjamin Abbott  55 y.o. male  PRE-OPERATIVE DIAGNOSIS:  Right perirectal abscess  POST-OPERATIVE DIAGNOSIS:  Right perirectal abscess  PROCEDURE:  Incision and drainage simple right perirectal abscess  SURGEON:  Georganna Skeans, MD  ASSISTANTS: none   ANESTHESIA:   local and IV pain medicine  EBL:     BLOOD ADMINISTERED:none  DRAINS: none   SPECIMEN:  Culture  DISPOSITION OF SPECIMEN:  Microbiology lab  DICTATION: .Dragon Dictation Informed consent was obtained. Timeout procedure was performed. He received intravenous fentanyl. His right buttock area was prepped and draped in a sterile fashion. Local anesthetic was injected. A vertical incision was made in the area of fluctuance releasing a large volume of bloody pus. Loculations within the wound were broken up expressing further drainage. Once the wound was adequately evacuated, it was irrigated and packed with one-inch iodoform gauze. A bulky sterile gauze dressing was applied. There was good hemostasis and he tolerated this well. PATIENT DISPOSITION:  Emergency department   Delay start of Pharmacological VTE agent (>24hrs) due to surgical blood loss or risk of bleeding:  no  Georganna Skeans, MD, MPH, FACS Pager: [phone]  7/2/201711:55 AM

## 2016-03-27 NOTE — ED Provider Notes (Signed)
CSN: [id number]     Arrival date & time 03/27/16  0857 History   First MD Initiated Contact with Patient 03/27/16 [phone]     Chief Complaint  Patient presents with  . Groin Swelling     (Consider location/radiation/quality/duration/timing/severity/associated sxs/prior Treatment) HPI  Patient's medical history significant for history of colon cancer with partial small bowel resection in 1999 done in [place], [place]. Patient started developing a tender swollen area on the right side of his buttock 5 days ago. He states initially it was just a tender area and now it has gotten very large and very painful. He reports since it has enlarged, he has not had a bowel movement in 2 days. He denies he has associated abdominal pain, vomiting. Patient reports subjective fever and chills. Pain worse with walking or sitting on his buttocks. He reports distant history of a boil that resolved with Epsom salt baths. No other history of recurrent skin abscess or perirectal abscess. Past Medical History  Diagnosis Date  . Hypertension   . Asthma    History reviewed. No pertinent past surgical history. No family history on file. Social History  Substance Use Topics  . Smoking status: Current Every Day Smoker  . Smokeless tobacco: None  . Alcohol Use: Yes    Review of Systems 10 Systems reviewed and are negative for acute change except as noted in the HPI.    Allergies  Review of patient's allergies indicates no known allergies.  Home Medications   Prior to Admission medications   Medication Sig Start Date End Date Taking? Authorizing Provider  albuterol (PROVENTIL HFA;VENTOLIN HFA) 108 (90 Base) MCG/ACT inhaler Inhale 2 puffs into the lungs every 6 (six) hours as needed for wheezing or shortness of breath.   Yes Historical Provider, MD  amoxicillin-clavulanate (AUGMENTIN) 875-125 MG tablet Take 1 tablet by mouth 2 (two) times daily. One po bid x 7 days 03/27/16   Charlesetta Shanks, MD    benzonatate (TESSALON) 100 MG capsule Take 1 capsule (100 mg total) by mouth every 8 (eight) hours. Patient not taking: Reported on 03/27/2016 03/03/16   Larene Pickett, PA-C  cyclobenzaprine (FLEXERIL) 10 MG tablet Take 0.5-1 tablets (5-10 mg total) by mouth 2 (two) times daily as needed. Patient not taking: Reported on 03/27/2016 02/17/16   Delos Haring, PA-C  docusate sodium (COLACE) 100 MG capsule Take 1 capsule (100 mg total) by mouth every 12 (twelve) hours. Patient not taking: Reported on 02/17/2016 06/06/15   Orlie Dakin, MD  docusate sodium (COLACE) 100 MG capsule Take 1 capsule (100 mg total) by mouth every 12 (twelve) hours. 03/27/16   Charlesetta Shanks, MD  HYDROcodone-acetaminophen (NORCO/VICODIN) 5-325 MG tablet Take 1-2 tablets by mouth every 4 (four) hours as needed for moderate pain or severe pain. 03/27/16   Charlesetta Shanks, MD  naproxen (NAPROSYN) 500 MG tablet Take 1 tablet (500 mg total) by mouth 2 (two) times daily. Patient not taking: Reported on 03/27/2016 02/17/16   Delos Haring, PA-C  predniSONE (DELTASONE) 20 MG tablet Take 40 mg by mouth daily for 3 days, then 20mg  by mouth daily for 3 days, then 10mg  daily for 3 days Patient not taking: Reported on 03/27/2016 03/03/16   Larene Pickett, PA-C   BP 120/73 mmHg  Pulse 90  Temp(Src) 97.6 F (36.4 C) (Oral)  Resp 16  Ht 5\' 9"  (1.753 m)  Wt 218 lb (98.884 kg)  BMI 32.18 kg/m2  SpO2 95% Physical Exam  Constitutional: He is oriented to  person, place, and time. He appears well-developed and well-nourished.  HENT:  Head: Normocephalic and atraumatic.  Poor dentition with significant gingivitis  Eyes: EOM are normal. Pupils are equal, round, and reactive to light.  Neck: Neck supple.  Cardiovascular: Normal rate, regular rhythm, normal heart sounds and intact distal pulses.   Pulmonary/Chest: Effort normal. No respiratory distress.  Inspiratory, course larger airway wheeze. Adequate air flow to basis. No rhonchi.  Abdominal: Soft.  Bowel sounds are normal. He exhibits no distension. There is no tenderness.  Genitourinary:     Patient has very large, very firm tender area from perineum, right gluteus from cleft laterally that is approximately 20 cm length and 15 cm with. Digital rectal exam shows tenderness in the rectum, no evident blood but purulent and malodorous appearing discharge.  Musculoskeletal: Normal range of motion. He exhibits no edema.  Neurological: He is alert and oriented to person, place, and time. He has normal strength. Coordination normal. GCS eye subscore is 4. GCS verbal subscore is 5. GCS motor subscore is 6.  Skin: Skin is warm, dry and intact.  Psychiatric: He has a normal mood and affect.    ED Course  Procedures (including critical care time) Labs Review Labs Reviewed  COMPREHENSIVE METABOLIC PANEL - Abnormal; Notable for the following:    Potassium 3.4 (*)    Chloride 96 (*)    Glucose, Bld 127 (*)    Albumin 3.0 (*)    All other components within normal limits  CBC WITH DIFFERENTIAL/PLATELET - Abnormal; Notable for the following:    WBC 12.5 (*)    Hemoglobin 12.5 (*)    HCT 38.4 (*)    Neutro Abs 9.9 (*)    All other components within normal limits  CULTURE, BLOOD (ROUTINE X 2)  CULTURE, BLOOD (ROUTINE X 2)  BODY FLUID CULTURE  URINALYSIS, ROUTINE W REFLEX MICROSCOPIC (NOT AT [facility])  I-STAT CG4 LACTIC ACID, ED    Imaging Review Ct Abdomen Pelvis W Contrast  03/27/2016  CLINICAL DATA:  Right groin and buttock pain EXAM: CT ABDOMEN AND PELVIS WITH CONTRAST TECHNIQUE: Multidetector CT imaging of the abdomen and pelvis was performed using the standard protocol following bolus administration of intravenous contrast. CONTRAST:  100 mL ISOVUE-300 IOPAMIDOL (ISOVUE-300) INJECTION 61% COMPARISON:  02/16/2016 FINDINGS: Lower chest:  No acute findings. Hepatobiliary: No masses or other significant abnormality. Pancreas: No mass, inflammatory changes, or other significant abnormality.  Spleen: Within normal limits in size and appearance. Adrenals/Urinary Tract: No masses identified. No evidence of hydronephrosis. Stomach/Bowel: Majority of the colon is been surgically removed consistent with the patient's given clinical history. Anterior abdominal wall changes are noted consistent with prior ostomy. Vascular/Lymphatic: No pathologically enlarged lymph nodes. No evidence of abdominal aortic aneurysm. Reproductive: No mass or other significant abnormality. Other: In the right buttock medially there is a 6.1 x 2.5 x 6.0 cm air-fluid collection consistent with subcutaneous abscess. Considerable skin thickening is noted as well. Musculoskeletal:  No suspicious bone lesions identified. IMPRESSION: Changes consistent with subcutaneous abscess in the right buttock near the anus. Electronically Signed   By: Inez Catalina M.D.   On: 03/27/2016 10:46   I have personally reviewed and evaluated these images and lab results as part of my medical decision-making.   EKG Interpretation None      Consult: Patient's case reviewed with Dr. Grandville Silos. Dr. Grandville Silos has reviewed CT scan and performed incision and drainage in the emergency department. He plans for the patient have follow-up in the  office on Wednesday and be discharged on Augmentin 875 twice daily. MDM   Final diagnoses:  Abscess of buttock, right  Cellulitis of buttock   Patient presents with very firm and tender abscess with cellulitis on the right buttock. He is alert and nontoxic. Vital signs are stable. No elevation and lactic acid. He is not having intra-abdominal tenderness. CT illustrated well-defined abscess pocket. Incision and drainage performed by Dr. Grandville Silos in the emergency department. Discharge planning is for follow-up on Wednesday and discharge with Augmentin.    Charlesetta Shanks, MD 03/27/16 [phone]

## 2016-03-27 NOTE — ED Notes (Signed)
Pt still aware of need for urine.  Unable to void at this time.

## 2016-03-27 NOTE — Consult Note (Signed)
Reason for Consult: Right perirectal abscess Referring Physician: [name] is an 55 y.o. male.  HPI: [name] presented to the emergency department complaining of right buttock pain for the past several days. He tried to soak with Epsom salts but had no drainage nor relief. The pain was worse this morning so he came to the emergency department. Evaluation here shows mild leukocytosis of 12,500 and no fever. He underwent further evaluation with CT scan of the abdomen and pelvis. This demonstrates right perirectal abscess without further complicating features. Of note, he is status post colectomy for colon cancer.  Past Medical History  Diagnosis Date  . Hypertension   . Asthma     History reviewed. No pertinent past surgical history.  No family history on file.  Social History:  reports that he has been smoking.  He does not have any smokeless tobacco history on file. He reports that he drinks alcohol. He reports that he uses illicit drugs (Marijuana).  Allergies: No Known Allergies  Medications: Prior to Admission:  (Not in a hospital admission)  Results for orders placed or performed during the hospital encounter of 03/27/16 (from the past 48 hour(s))  Comprehensive metabolic panel     Status: Abnormal   Collection Time: 03/27/16  9:48 AM  Result Value Ref Range   Sodium 135 135 - 145 mmol/L   Potassium 3.4 (L) 3.5 - 5.1 mmol/L   Chloride 96 (L) 101 - 111 mmol/L   CO2 31 22 - 32 mmol/L   Glucose, Bld 127 (H) 65 - 99 mg/dL   BUN 11 6 - 20 mg/dL   Creatinine, Ser 0.90 0.61 - 1.24 mg/dL   Calcium 9.6 8.9 - 10.3 mg/dL   Total Protein 7.5 6.5 - 8.1 g/dL   Albumin 3.0 (L) 3.5 - 5.0 g/dL   AST 23 15 - 41 U/L   ALT 34 17 - 63 U/L   Alkaline Phosphatase 103 38 - 126 U/L   Total Bilirubin 0.4 0.3 - 1.2 mg/dL   GFR calc non Af Amer >60 >60 mL/min   GFR calc Af Amer >60 >60 mL/min    Comment: (NOTE) The eGFR has been calculated using the CKD EPI  equation. This calculation has not been validated in all clinical situations. eGFR's persistently <60 mL/min signify possible Chronic Kidney Disease.    Anion gap 8 5 - 15  CBC with Differential     Status: Abnormal   Collection Time: 03/27/16  9:48 AM  Result Value Ref Range   WBC 12.5 (H) 4.0 - 10.5 K/uL   RBC 4.53 4.22 - 5.81 MIL/uL   Hemoglobin 12.5 (L) 13.0 - 17.0 g/dL   HCT 38.4 (L) 39.0 - 52.0 %   MCV 84.8 78.0 - 100.0 fL   MCH 27.6 26.0 - 34.0 pg   MCHC 32.6 30.0 - 36.0 g/dL   RDW 13.0 11.5 - 15.5 %   Platelets 334 150 - 400 K/uL   Neutrophils Relative % 79 %   Neutro Abs 9.9 (H) 1.7 - 7.7 K/uL   Lymphocytes Relative 12 %   Lymphs Abs 1.5 0.7 - 4.0 K/uL   Monocytes Relative 8 %   Monocytes Absolute 1.0 0.1 - 1.0 K/uL   Eosinophils Relative 1 %   Eosinophils Absolute 0.1 0.0 - 0.7 K/uL   Basophils Relative 0 %   Basophils Absolute 0.0 0.0 - 0.1 K/uL  I-Stat CG4 Lactic Acid, ED     Status: None  Collection Time: 03/27/16  9:59 AM  Result Value Ref Range   Lactic Acid, Venous 1.72 0.5 - 1.9 mmol/L    Ct Abdomen Pelvis W Contrast  03/27/2016  CLINICAL DATA:  Right groin and buttock pain EXAM: CT ABDOMEN AND PELVIS WITH CONTRAST TECHNIQUE: Multidetector CT imaging of the abdomen and pelvis was performed using the standard protocol following bolus administration of intravenous contrast. CONTRAST:  100 mL ISOVUE-300 IOPAMIDOL (ISOVUE-300) INJECTION 61% COMPARISON:  02/16/2016 FINDINGS: Lower chest:  No acute findings. Hepatobiliary: No masses or other significant abnormality. Pancreas: No mass, inflammatory changes, or other significant abnormality. Spleen: Within normal limits in size and appearance. Adrenals/Urinary Tract: No masses identified. No evidence of hydronephrosis. Stomach/Bowel: Majority of the colon is been surgically removed consistent with the patient's given clinical history. Anterior abdominal wall changes are noted consistent with prior ostomy.  Vascular/Lymphatic: No pathologically enlarged lymph nodes. No evidence of abdominal aortic aneurysm. Reproductive: No mass or other significant abnormality. Other: In the right buttock medially there is a 6.1 x 2.5 x 6.0 cm air-fluid collection consistent with subcutaneous abscess. Considerable skin thickening is noted as well. Musculoskeletal:  No suspicious bone lesions identified. IMPRESSION: Changes consistent with subcutaneous abscess in the right buttock near the anus. Electronically Signed   By: Inez Catalina M.D.   On: 03/27/2016 10:46    Review of Systems  Constitutional: Positive for malaise/fatigue. Negative for fever.  HENT: Negative.   Respiratory: Negative.   Cardiovascular: Negative.   Gastrointestinal: Negative for nausea, vomiting, abdominal pain and blood in stool.  Genitourinary: Negative.   Musculoskeletal:       See HPI  Skin: Negative.   Neurological: Negative.   Endo/Heme/Allergies: Negative.   Psychiatric/Behavioral: Negative.    Blood pressure 120/73, pulse 90, temperature 97.6 F (36.4 C), temperature source Oral, resp. rate 16, height 5' 9"  (1.753 m), weight 98.884 kg (218 lb), SpO2 95 %. Physical Exam  Constitutional: He appears well-developed and well-nourished.  HENT:  Head: Normocephalic.  Right Ear: External ear normal.  Left Ear: External ear normal.  Mouth/Throat: Oropharynx is clear and moist.  Neck: Neck supple. No tracheal deviation present.  Cardiovascular: Normal rate, normal heart sounds and intact distal pulses.   Respiratory: Effort normal and breath sounds normal. No stridor. No respiratory distress. He has no wheezes. He has no rales.  GI: Soft. He exhibits no distension. There is no tenderness. There is no rebound.  Surgical scars  Genitourinary:  Right sided perirectal fluctuant area 4 x 7 cm with no spontaneous drainage  Skin: Skin is warm.    Assessment/Plan: Right perirectal abscess - we'll proceed with incision and drainage at the  bedside in the emergency department. Procedure, risks, and benefits were discussed with him and consent was signed.  Augmentin 875 mg by mouth twice a day.  Leave packing in place and change overlying gauze at least daily and as needed.  We will follow-up in our urgent clinic Wednesday morning. My office will call him tomorrow for the appointment time.  Cythia Bachtel E 03/27/2016, 11:49 AM

## 2016-03-27 NOTE — ED Notes (Signed)
Transported to CT 

## 2016-03-30 LAB — AEROBIC CULTURE  (SUPERFICIAL SPECIMEN)

## 2016-03-30 LAB — AEROBIC CULTURE W GRAM STAIN (SUPERFICIAL SPECIMEN)

## 2016-03-31 ENCOUNTER — Telehealth: Payer: Self-pay | Admitting: *Deleted

## 2016-03-31 NOTE — ED Notes (Signed)
Post ED Visit - Positive Culture Follow-up  Culture report reviewed by antimicrobial stewardship pharmacist:  []  Elenor Quinones, Pharm.D. []  Heide Guile, Pharm.D., BCPS [x]  Parks Neptune, Pharm.D. []  Alycia Rossetti, Pharm.D., BCPS []  [place], [place].D., BCPS, AAHIVP []  Legrand Como, Pharm.D., BCPS, AAHIVP []  Milus Glazier, Pharm.D. []  Stephens November, Pharm.D.  Positive aerobic culture Treated with Amoxicillin-Pot Clavulanate, organism sensitive to the same and no further patient follow-up is required at this time.  Harlon Flor [facility] 03/31/2016, 10:28 AM

## 2016-04-02 LAB — CULTURE, BLOOD (ROUTINE X 2)
CULTURE: NO GROWTH
CULTURE: NO GROWTH

## 2016-05-03 DIAGNOSIS — Z5900 Homelessness unspecified: Secondary | ICD-10-CM

## 2016-05-03 DIAGNOSIS — R05 Cough: Secondary | ICD-10-CM

## 2016-05-03 DIAGNOSIS — J4551 Severe persistent asthma with (acute) exacerbation: Secondary | ICD-10-CM

## 2016-05-03 DIAGNOSIS — R52 Pain, unspecified: Secondary | ICD-10-CM

## 2016-05-03 DIAGNOSIS — R053 Chronic cough: Secondary | ICD-10-CM

## 2016-05-04 DIAGNOSIS — Z5900 Homelessness unspecified: Secondary | ICD-10-CM

## 2016-05-04 DIAGNOSIS — R053 Chronic cough: Secondary | ICD-10-CM

## 2016-05-04 DIAGNOSIS — J4551 Severe persistent asthma with (acute) exacerbation: Secondary | ICD-10-CM

## 2016-05-04 DIAGNOSIS — R05 Cough: Secondary | ICD-10-CM

## 2016-05-04 DIAGNOSIS — R52 Pain, unspecified: Secondary | ICD-10-CM

## 2016-05-04 NOTE — Congregational Nurse Program (Signed)
Congregational Nurse Program Note  Date of Encounter: 05/04/2016  Past Medical History: No past medical history on file.  Encounter Details:     CNP Questionnaire - 05/04/16 1753      Patient Demographics   Is this a new or existing patient? Existing   Patient is considered a/an Not Applicable   Race African-American/Black     Patient Assistance   Location of Patient Assistance Not Applicable   Patient's financial/insurance status [employer]   Uninsured Patient Yes   Interventions Follow-up/Education/Support provided after completed appt.   Patient referred to apply for the following financial assistance Not Applicable   Food insecurities addressed Not Applicable   Transportation assistance No   Assistance securing medications No   Educational health offerings Chronic disease     Encounter Details   Was an Emergency Department visit averted? Yes   Does patient have a medical provider? Yes   Patient referred to Follow up with established PCP   Was a mental health screening completed? (GAINS tool) No   Does patient have dental issues? No   Does patient have vision issues? No   Does your patient have an abnormal blood pressure today? No   Since previous encounter, have you referred patient for abnormal blood pressure that resulted in a new diagnosis or medication change? No   Does your patient have an abnormal blood glucose today? No   Since previous encounter, have you referred patient for abnormal blood glucose that resulted in a new diagnosis or medication change? No   Was there a life-saving intervention made? No     client in today stating he saw the  NP  On today  Was given two  New prescriptions  For his  Cough and for his  asthma and was given a shot  For his  Asthma. And a asthma  Treatment    Client seemed to be  Calmer today and will pick up his medications  On tomorrow .  Follow  As needed

## 2016-05-04 NOTE — Congregational Nurse Program (Signed)
Congregational Nurse Program Note  Date of Encounter: 05/03/2016  Past Medical History: No past medical history on file.  Encounter Details:     CNP Questionnaire - 05/04/16 1728      Patient Demographics   Is this a new or existing patient? Existing   Patient is considered a/an Not Applicable   Race African-American/Black     Patient Assistance   Location of Patient Assistance Not Applicable   Patient's financial/insurance status [facility]/Care Connects   Uninsured Patient Yes   Interventions Not Applicable   Patient referred to apply for the following financial assistance Not Applicable   Food insecurities addressed Not Applicable   Transportation assistance No   Assistance securing medications No   Educational health offerings Chronic disease;Medications     Encounter Details   Primary purpose of visit Chronic Illness/Condition Visit;Education/Health Concerns;Spiritual Care/Support Visit   Was an Emergency Department visit averted? Yes   Does patient have a medical provider? Yes   Patient referred to Follow up with established PCP;Area Agency   Does patient have dental issues? No   Does patient have vision issues? No   Does your patient have an abnormal blood pressure today? Yes   Since previous encounter, have you referred patient for abnormal blood pressure that resulted in a new diagnosis or medication change? No   Does your patient have an abnormal blood glucose today? No   Since previous encounter, have you referred patient for abnormal blood glucose that resulted in a new diagnosis or medication change? No   Was there a life-saving intervention made? No    Client  In   with  Complaints  Of left upper left sided pain, no chest pains ,coughing a lot  He states , sounds very congested , blood  Pressure  Up , unable to sleep at night ,using his inhaler every hour, In  No immmediate    Distress, is a severe asthmatic ,counsled regarding  Triggers ,shortness of  Breath,  client understands when his  Breathing is altered and what to do . Side  Pain probably  Coming  Coming  From chronic  Cough, but  May  Need something  For  Asthma  And chronic  Cough besides Zytrex.  Referring to see NP  In am if he  Can get through  Tonight,referral given.  Follow up  tomorrow

## 2016-05-18 DIAGNOSIS — Z5900 Homelessness unspecified: Secondary | ICD-10-CM

## 2016-05-18 DIAGNOSIS — R053 Chronic cough: Secondary | ICD-10-CM

## 2016-05-18 DIAGNOSIS — I10 Essential (primary) hypertension: Secondary | ICD-10-CM

## 2016-05-18 DIAGNOSIS — R05 Cough: Secondary | ICD-10-CM

## 2016-05-19 DIAGNOSIS — Z5900 Homelessness unspecified: Secondary | ICD-10-CM

## 2016-05-19 DIAGNOSIS — I10 Essential (primary) hypertension: Secondary | ICD-10-CM

## 2016-05-24 ENCOUNTER — Telehealth: Payer: Self-pay

## 2016-05-24 NOTE — Telephone Encounter (Signed)
TC to  NP  Left message regarding  Clients  Blood  Pressure  Medications . Gave his  Blood  Pressure  Readings.

## 2016-05-24 NOTE — Congregational Nurse Program (Signed)
Congregational Nurse Program Note  Date of Encounter: 05/19/2016  Past Medical History: No past medical history on file.  Encounter Details:     CNP Questionnaire - 05/24/16 1807      Patient Demographics   Is this a new or existing patient? Existing   Patient is considered a/an Not Applicable   Race African-American/Black     Patient Assistance   Location of Patient Assistance Not Applicable   Patient's financial/insurance status Low Income;[facility] Card/Care Connects   Uninsured Patient Yes   Interventions Follow-up/Education/Support provided after completed appt.   Patient referred to apply for the following financial assistance Not Applicable   Food insecurities addressed Not Applicable   Transportation assistance No   Assistance securing medications Yes   Type of [place] Department   Educational health offerings Hypertension;Chronic disease;Navigating the healthcare system;Medications     Encounter Details   Primary purpose of visit Spiritual Care/Support Visit;Navigating the Healthcare System;Education/Health Concerns   Was an Emergency Department visit averted? Yes   Does patient have a medical provider? Yes   Patient referred to Follow up with established PCP   Was a mental health screening completed? (GAINS tool) No   Does patient have dental issues? No   Does patient have vision issues? No   Does your patient have an abnormal blood pressure today? No   Since previous encounter, have you referred patient for abnormal blood pressure that resulted in a new diagnosis or medication change? No   Does your patient have an abnormal blood glucose today? No   Since previous encounter, have you referred patient for abnormal blood glucose that resulted in a new diagnosis or medication change? No   Was there a life-saving intervention made? No     Nurse picked up  Clients  Medications  At  [place] to  [facility] . Client not  In  but  Medication left in his  Box. Will follow  up  Next week and  Check blood  Pressure and counsel regarding  Importance of  Returning to see PCP  For  Refill and next pick up.

## 2016-05-24 NOTE — Congregational Nurse Program (Signed)
Congregational Nurse Program Note  Date of Encounter: 05/18/2016  Past Medical History: No past medical history on file.  Encounter Details:     CNP Questionnaire - 05/24/16 1751      Patient Demographics   Is this a new or existing patient? Existing   Patient is considered a/an Not Applicable   Race African-American/Black     Patient Assistance   Location of Patient Assistance Not Applicable   Patient's financial/insurance status Low Income;[facility] Card/Care Connects   Uninsured Patient Yes   Interventions Counseled to make appt. with provider   Patient referred to apply for the following financial assistance Not Applicable   Food insecurities addressed Not Applicable   Transportation assistance No   Assistance securing medications Yes   Type of [place] Department   Educational health offerings Medications;Chronic disease;Hypertension     Encounter Details   Primary purpose of visit Chronic Illness/Condition Visit;Education/Health Concerns;Navigating the Healthcare System   Was an Emergency Department visit averted? Yes   Does patient have a medical provider? Yes   Patient referred to Follow up with established PCP   Was a mental health screening completed? (GAINS tool) No   Does patient have dental issues? No   Does patient have vision issues? No   Does your patient have an abnormal blood pressure today? Yes   Since previous encounter, have you referred patient for abnormal blood pressure that resulted in a new diagnosis or medication change? No   Does your patient have an abnormal blood glucose today? No   Since previous encounter, have you referred patient for abnormal blood glucose that resulted in a new diagnosis or medication change? No   Was there a life-saving intervention made? Yes     Client in to see nurse stating  He is out of his  Medication  And  Has  Been out for about  2 weeks . Nurse took his  Blood  Pressure   160/102  P 91  .  Nurse will call  NP @ [facility]  For medications  And  Try to pick up  For client. Counseled  Regarding his  High  Blood pressure.

## 2016-06-08 DIAGNOSIS — I10 Essential (primary) hypertension: Secondary | ICD-10-CM

## 2016-06-08 NOTE — Congregational Nurse Program (Signed)
Congregational Nurse Program Note  Date of Encounter: 06/08/2016  Past Medical History: Past Medical History:  Diagnosis Date  . Asthma   . Hypertension     Encounter Details:     CNP Questionnaire - 06/08/16 1810      Patient Demographics   Is this a new or existing patient? Existing   Patient is considered a/an Not Applicable   Race African-American/Black     Patient Assistance   Location of Patient Assistance Not Applicable   Patient's financial/insurance status Self-Pay   Uninsured Patient Yes   Interventions Counseled to make appt. with provider;Assisted patient in making appt.;Follow-up/Education/Support provided after completed appt.   Patient referred to apply for the following financial assistance [employer] insecurities addressed Not Applicable   Transportation assistance Yes   Type of Assistance Bus Pass Given   Assistance securing medications No   Educational health offerings Hypertension;Medications;Chronic disease     Encounter Details   Primary purpose of visit Chronic Illness/Condition Visit;Education/Health Concerns;Spiritual Care/Support Visit   Was an Emergency Department visit averted? Yes   Does patient have a medical provider? Yes   Patient referred to Follow up with established PCP   Was a mental health screening completed? (GAINS tool) No   Does patient have dental issues? No   Does patient have vision issues? No   Does your patient have an abnormal blood pressure today? Yes   Since previous encounter, have you referred patient for abnormal blood pressure that resulted in a new diagnosis or medication change? Yes   Does your patient have an abnormal blood glucose today? No   Since previous encounter, have you referred patient for abnormal blood glucose that resulted in a new diagnosis or medication change? No   Was there a life-saving intervention made? 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 Out of  Several medications ,has called  NP  tp  Refill needs bus  Pass to pick up and bus pass to  Go to [facility]. Blood  Pressure up  And has been relatively high. Referral to  NP to  Look at  Blood  Pressure medications  And elevate dosages as it  Relates to  Blood  Pressure . Follow up next week

## 2016-06-15 DIAGNOSIS — M25562 Pain in left knee: Secondary | ICD-10-CM

## 2016-06-15 DIAGNOSIS — I10 Essential (primary) hypertension: Secondary | ICD-10-CM

## 2016-06-15 NOTE — Congregational Nurse Program (Signed)
Congregational Nurse Program Note  Date of Encounter: 06/15/2016  Past Medical History: Past Medical History:  Diagnosis Date  . Asthma   . Hypertension     Encounter Details:     CNP Questionnaire - 06/15/16 1912      Patient Demographics   Is this a new or existing patient? Existing   Patient is considered a/an Not Applicable   Race African-American/Black     Patient Assistance   Location of Patient Assistance Not Applicable   Patient's financial/insurance status Self-Pay   Uninsured Patient Yes   Interventions Follow-up/Education/Support provided after completed appt.;Appt. has been completed   Patient referred to apply for the following financial assistance [employer] insecurities addressed Not Applicable   Transportation assistance Yes   Type of Assistance [employer] Given   Assistance securing medications No   Educational health offerings Hypertension;Medications     Encounter Details   Primary purpose of visit Education/Health Concerns;Chronic Illness/Condition Visit;Spiritual Care/Support Visit   Was an Emergency Department visit averted? Not Applicable   Does patient have a medical provider? Yes   Patient referred to Follow up with established PCP;Area Agency   Was a mental health screening completed? (GAINS tool) No   Does patient have dental issues? No   Does patient have vision issues? No   Does your patient have an abnormal blood pressure today? Yes   Since previous encounter, have you referred patient for abnormal blood pressure that resulted in a new diagnosis or medication change? No   Does your patient have an abnormal blood glucose today? No   Since previous encounter, have you referred patient for abnormal blood glucose that resulted in a new diagnosis or medication change? No   Was there a life-saving intervention made? No    Client was seen by  NP  @  [place] today medications  Refilled  Needs  Bus  Passes to  Pick up  Medications   On tomorrow  ,given 2 passes. Blood  Down from last  Week  States  No  Change in medication ,referral given to  NP  Today .  Counseled  regarding  Leg  Pain . Will  Monitor  Blood  Pressure  Weekly.

## 2016-06-22 DIAGNOSIS — M25562 Pain in left knee: Secondary | ICD-10-CM

## 2016-06-22 DIAGNOSIS — I10 Essential (primary) hypertension: Secondary | ICD-10-CM

## 2016-06-29 DIAGNOSIS — I10 Essential (primary) hypertension: Secondary | ICD-10-CM

## 2016-06-29 DIAGNOSIS — M25562 Pain in left knee: Secondary | ICD-10-CM

## 2016-06-29 NOTE — Congregational Nurse Program (Signed)
Congregational Nurse Program Note  Date of Encounter: 06/22/2016  Past Medical History: Past Medical History:  Diagnosis Date  . Asthma   . Hypertension     Encounter Details:     CNP Questionnaire - 06/29/16 1752      Patient Demographics   Is this a new or existing patient? Existing   Patient is considered a/an Not Applicable   Race African-American/Black     Patient Assistance   Location of Patient Assistance Not Applicable   Patient's financial/insurance status Self-Pay   Uninsured Patient Yes   Interventions Follow-up/Education/Support provided after completed appt.   Patient referred to apply for the following financial assistance [employer];Medicaid   Food insecurities addressed Not Applicable   Transportation assistance Yes   Type of Assistance Bus Pass Given   Assistance securing medications No   Educational health offerings Hypertension;Medications;Navigating the healthcare system;Chronic disease     Encounter Details   Primary purpose of visit Education/Health Concerns;Spiritual Care/Support Visit   Was an Emergency Department visit averted? Not Applicable   Does patient have a medical provider? Yes   Patient referred to Follow up with established PCP   Was a mental health screening completed? (GAINS tool) No   Does patient have dental issues? No   Does patient have vision issues? No   Does your patient have an abnormal blood pressure today? Yes   Since previous encounter, have you referred patient for abnormal blood pressure that resulted in a new diagnosis or medication change? No   Does your patient have an abnormal blood glucose today? No   Since previous encounter, have you referred patient for abnormal blood glucose that resulted in a new diagnosis or medication change? No   Was there a life-saving intervention made? No    Client in today  For  Blood  Pressure  Recheck, much better ,back on his  Medication but  Needs to  Get to   Disability  To  Apply  , bus  Tickets  Given . States he is close to getting his  [place]  Will know  Next week ,excited . But  Will need to  Get  His  Disability  So  He  Can keep it he  States. . Pain in legs  continues

## 2016-06-29 NOTE — Congregational Nurse Program (Signed)
Congregational Nurse Program Note  Date of Encounter: 06/29/2016  Past Medical History: Past Medical History:  Diagnosis Date  . Asthma   . Hypertension     Encounter Details:     CNP Questionnaire - 06/29/16 1803      Patient Demographics   Is this a new or existing patient? Existing   Patient is considered a/an Not Applicable   Race African-American/Black     Patient Assistance   Location of Patient Assistance Not Applicable   Patient's financial/insurance status Self-Pay   Interventions Follow-up/Education/Support provided after completed appt.   Patient referred to apply for the following financial assistance [employer];Medicaid   Food insecurities addressed Not Applicable   Transportation assistance No   Assistance securing medications No   Educational health offerings Hypertension;Navigating the healthcare system;Chronic disease     Encounter Details   Primary purpose of visit Education/Health Concerns;Spiritual Care/Support Visit   Was an Emergency Department visit averted? Not Applicable   Does patient have a medical provider? Yes   Patient referred to Follow up with established PCP   Was a mental health screening completed? (GAINS tool) No   Does patient have dental issues? No   Does patient have vision issues? No   Does your patient have an abnormal blood pressure today? Yes   Since previous encounter, have you referred patient for abnormal blood pressure that resulted in a new diagnosis or medication change? No   Does your patient have an abnormal blood glucose today? No   Since previous encounter, have you referred patient for abnormal blood glucose that resulted in a new diagnosis or medication change? No   Was there a life-saving intervention made? No    Client in today  For  Blood  Pressure monitoring  . B/P  Has improved ,counseled client to  Stay on his medication and don't let it  Run out. Client agrees. Client is  Moving out  Tomorrow   And is  Excited . Will  Come  Back to  [facility]  To  Have  B/P monitored.  Wished  Client well

## 2016-09-20 ENCOUNTER — Emergency Department (HOSPITAL_COMMUNITY): Payer: Self-pay

## 2016-09-20 ENCOUNTER — Encounter (HOSPITAL_COMMUNITY): Payer: Self-pay | Admitting: Neurology

## 2016-09-20 ENCOUNTER — Emergency Department (HOSPITAL_COMMUNITY)
Admission: EM | Admit: 2016-09-20 | Discharge: 2016-09-20 | Disposition: A | Discharge status: Home / Self Care | Payer: Self-pay | Attending: Emergency Medicine | Admitting: Emergency Medicine

## 2016-09-20 DIAGNOSIS — L0291 Cutaneous abscess, unspecified: Secondary | ICD-10-CM

## 2016-09-20 DIAGNOSIS — I10 Essential (primary) hypertension: Secondary | ICD-10-CM | POA: Insufficient documentation

## 2016-09-20 DIAGNOSIS — F17200 Nicotine dependence, unspecified, uncomplicated: Secondary | ICD-10-CM | POA: Insufficient documentation

## 2016-09-20 DIAGNOSIS — Z79899 Other long term (current) drug therapy: Secondary | ICD-10-CM | POA: Insufficient documentation

## 2016-09-20 DIAGNOSIS — J45909 Unspecified asthma, uncomplicated: Secondary | ICD-10-CM | POA: Insufficient documentation

## 2016-09-20 DIAGNOSIS — L0231 Cutaneous abscess of buttock: Secondary | ICD-10-CM | POA: Insufficient documentation

## 2016-09-20 DIAGNOSIS — L03317 Cellulitis of buttock: Secondary | ICD-10-CM

## 2016-09-20 LAB — CBC WITH DIFFERENTIAL/PLATELET
Basophils Absolute: 0 10*3/uL (ref 0.0–0.1)
Basophils Relative: 0 %
Eosinophils Absolute: 0.2 10*3/uL (ref 0.0–0.7)
Eosinophils Relative: 3 %
HEMATOCRIT: 40.2 % (ref 39.0–52.0)
HEMOGLOBIN: 13.7 g/dL (ref 13.0–17.0)
LYMPHS ABS: 2.3 10*3/uL (ref 0.7–4.0)
Lymphocytes Relative: 25 %
MCH: 28.5 pg (ref 26.0–34.0)
MCHC: 34.1 g/dL (ref 30.0–36.0)
MCV: 83.8 fL (ref 78.0–100.0)
MONOS PCT: 6 %
Monocytes Absolute: 0.6 10*3/uL (ref 0.1–1.0)
NEUTROS ABS: 5.9 10*3/uL (ref 1.7–7.7)
NEUTROS PCT: 66 %
Platelets: 209 10*3/uL (ref 150–400)
RBC: 4.8 MIL/uL (ref 4.22–5.81)
RDW: 13.9 % (ref 11.5–15.5)
WBC: 9 10*3/uL (ref 4.0–10.5)

## 2016-09-20 LAB — I-STAT CHEM 8, ED
BUN: 14 mg/dL (ref 6–20)
CHLORIDE: 107 mmol/L (ref 101–111)
Calcium, Ion: 1.25 mmol/L (ref 1.15–1.40)
Creatinine, Ser: 0.9 mg/dL (ref 0.61–1.24)
Glucose, Bld: 108 mg/dL — ABNORMAL HIGH (ref 65–99)
HEMATOCRIT: 41 % (ref 39.0–52.0)
Hemoglobin: 13.9 g/dL (ref 13.0–17.0)
POTASSIUM: 3.9 mmol/L (ref 3.5–5.1)
SODIUM: 140 mmol/L (ref 135–145)
TCO2: 22 mmol/L (ref 0–100)

## 2016-09-20 MED ORDER — IOPAMIDOL (ISOVUE-300) INJECTION 61%
INTRAVENOUS | Status: AC
  Administered 2016-09-20: 100 mL via INTRAVENOUS
  Filled 2016-09-20: qty 100

## 2016-09-20 MED ORDER — SULFAMETHOXAZOLE-TRIMETHOPRIM 800-160 MG PO TABS: 1.0000 | ORAL_TABLET | Freq: Two times a day (BID) | ORAL | 0 refills | Status: AC

## 2016-09-20 MED ORDER — HYDROCODONE-ACETAMINOPHEN 5-325 MG PO TABS: 1.0000 | ORAL_TABLET | Freq: Four times a day (QID) | ORAL | 0 refills | Status: DC | PRN

## 2016-09-20 MED ORDER — CEPHALEXIN 500 MG PO CAPS: 500.0000 mg | ORAL_CAPSULE | Freq: Four times a day (QID) | ORAL | 0 refills | Status: DC

## 2016-09-20 MED ORDER — LIDOCAINE-EPINEPHRINE 2 %-1:200000 IJ SOLN
10.0000 mL | Freq: Once | INTRAMUSCULAR | Status: AC
Start: 2016-09-20 — End: 2016-09-20
  Administered 2016-09-20: 10 mL via INTRADERMAL
  Filled 2016-09-20: qty 20

## 2016-09-20 NOTE — ED Notes (Signed)
Dressed is wound site with 4 by 4's

## 2016-09-20 NOTE — ED Notes (Signed)
EDP at bedside  

## 2016-09-20 NOTE — ED Provider Notes (Signed)
[facility] DEPT Provider Note   CSN: [id number] Arrival date & time: 09/20/16  0740     History   Chief Complaint Chief Complaint  Patient presents with  . Abscess    HPI Benjamin Abbott is a 55 y.o. male.  HPI Benjamin Abbott is a 55 y.o. male with history of asthma and hypertension, presents to emergency department complaining of an abscess to the buttock. Patient states he noticed pain 2 days ago. Yesterday noticed swelling. States it is painful to sit. He has tried Epsom salt baths and warm compresses with no relief. He reports that this is a recurrent problem in the same spot. Denies fever or chills. Denies drainage. Denies generalized malaise or any systemic symptoms.  Past Medical History:  Diagnosis Date  . Asthma   . Hypertension     There are no active problems to display for this patient.   History reviewed. No pertinent surgical history.     Home Medications    Prior to Admission medications   Medication Sig Start Date End Date Taking? Authorizing Provider  albuterol (PROVENTIL HFA;VENTOLIN HFA) 108 (90 Base) MCG/ACT inhaler Inhale 2 puffs into the lungs every 6 (six) hours as needed for wheezing or shortness of breath.    Historical Provider, MD  amoxicillin-clavulanate (AUGMENTIN) 875-125 MG tablet Take 1 tablet by mouth 2 (two) times daily. One po bid x 7 days 03/27/16   Charlesetta Shanks, MD  benzonatate (TESSALON) 100 MG capsule Take 1 capsule (100 mg total) by mouth every 8 (eight) hours. Patient not taking: Reported on 03/27/2016 03/03/16   Larene Pickett, PA-C  cyclobenzaprine (FLEXERIL) 10 MG tablet Take 0.5-1 tablets (5-10 mg total) by mouth 2 (two) times daily as needed. Patient not taking: Reported on 03/27/2016 02/17/16   Delos Haring, PA-C  docusate sodium (COLACE) 100 MG capsule Take 1 capsule (100 mg total) by mouth every 12 (twelve) hours. Patient not taking: Reported on 02/17/2016 06/06/15   Orlie Dakin, MD  docusate sodium (COLACE) 100 MG  capsule Take 1 capsule (100 mg total) by mouth every 12 (twelve) hours. 03/27/16   Charlesetta Shanks, MD  HYDROcodone-acetaminophen (NORCO/VICODIN) 5-325 MG tablet Take 1-2 tablets by mouth every 4 (four) hours as needed for moderate pain or severe pain. 03/27/16   Charlesetta Shanks, MD  naproxen (NAPROSYN) 500 MG tablet Take 1 tablet (500 mg total) by mouth 2 (two) times daily. Patient not taking: Reported on 03/27/2016 02/17/16   Delos Haring, PA-C  predniSONE (DELTASONE) 20 MG tablet Take 40 mg by mouth daily for 3 days, then 20mg  by mouth daily for 3 days, then 10mg  daily for 3 days Patient not taking: Reported on 03/27/2016 03/03/16   Larene Pickett, PA-C    Family History No family history on file.  Social History Social History  Substance Use Topics  . Smoking status: Current Some Day Smoker  . Smokeless tobacco: Not on file  . Alcohol use Yes     Allergies   Patient has no known allergies.   Review of Systems Review of Systems  Constitutional: Negative for chills and fever.  Respiratory: Negative for cough, chest tightness and shortness of breath.   Cardiovascular: Negative for chest pain, palpitations and leg swelling.  Gastrointestinal: Negative for abdominal distention, abdominal pain, diarrhea, nausea and vomiting.  Musculoskeletal: Negative for arthralgias, myalgias, neck pain and neck stiffness.  Skin: Positive for wound. Negative for rash.  Allergic/Immunologic: Negative for immunocompromised state.  Neurological: Negative for dizziness, weakness, light-headedness, numbness and  headaches.     Physical Exam Updated Vital Signs BP (!) 147/120 (BP Location: Left Arm)   Pulse 86   Temp 98.1 F (36.7 C) (Oral)   Resp 16   Ht 5\' 9"  (1.753 m)   Wt 104.3 kg   SpO2 97%   BMI 33.97 kg/m   Physical Exam  Constitutional: He appears well-developed and well-nourished. No distress.  HENT:  Head: Normocephalic and atraumatic.  Eyes: Conjunctivae are normal.  Neck: Neck supple.    Cardiovascular: Normal rate, regular rhythm and normal heart sounds.   Pulmonary/Chest: Effort normal. No respiratory distress. He has no wheezes. He has no rales.  Abdominal: Soft. Bowel sounds are normal. He exhibits no distension. There is no tenderness. There is no rebound.  Genitourinary:     Musculoskeletal: He exhibits no edema.  Neurological: He is alert.  Skin: Skin is warm and dry.  Nursing note and vitals reviewed.    ED Treatments / Results  Labs (all labs ordered are listed, but only abnormal results are displayed) Labs Reviewed - No data to display  EKG  EKG Interpretation None      INCISION AND DRAINAGE Performed by: Jeannett Senior A Consent: Verbal consent obtained. Risks and benefits: risks, benefits and alternatives were discussed Type: abscess  Body area: right buttock  Anesthesia: local infiltration  Incision was made with a scalpel.  Local anesthetic: lidocaine 2% w epinephrine  Anesthetic total: 8 ml  Complexity: complex Blunt dissection to break up loculations  Drainage: purulent  Drainage amount: large  Packing material: 1/4 in iodoform gauze  Patient tolerance: Patient tolerated the procedure well with no immediate complications.    Radiology Ct Pelvis W Contrast  Result Date: 09/20/2016 CLINICAL DATA:  Severe pelvic pain, right gluteal abscess. EXAM: CT PELVIS WITH CONTRAST TECHNIQUE: Multidetector CT imaging of the pelvis was performed using the standard protocol following the bolus administration of intravenous contrast. CONTRAST:  100 cc Isovue-300. COMPARISON:  03/27/2016. FINDINGS: Urinary Tract:  Bladder is low in volume. Bowel: An anastomosis is seen in the rectosigmoid colon. Visualized portions of the small bowel and colon are otherwise unremarkable. Vascular/Lymphatic: Atherosclerotic calcification of the arterial vasculature. Mildly prominent inguinal lymph nodes measure up to 1.4 cm on the right, as on 03/27/2016.  Reproductive:  Prostate is visualized. Other: Subcutaneous collection of fluid is seen along the inner right gluteal fold, measuring 1.7 x 3.9 cm. Soft tissue thickening is seen in the left perianal region (series 2, image 45). Musculoskeletal: No worrisome lytic or sclerotic lesions. IMPRESSION: 1. Subcutaneous abscess along the inner right gluteal fold/perianal region. 2. Left perianal soft tissue thickening without discrete abscess. Electronically Signed   By: Lorin Picket M.D.   On: 09/20/2016 10:56    Procedures Procedures (including critical care time)  Medications Ordered in ED Medications  lidocaine-EPINEPHrine (XYLOCAINE W/EPI) 2 %-1:200000 (PF) injection 10 mL (10 mLs Intradermal Given 09/20/16 [id number])     Initial Impression / Assessment and Plan / ED Course  I have reviewed the triage vital signs and the nursing notes.  Pertinent labs & imaging results that were available during my care of the patient were reviewed by me and considered in my medical decision making (see chart for details).  Clinical Course     Large area of induration to the right buttock. I attempted to incise and drain with no purulent drainage. Bedside ultrasound used to try to locate the pocket, and I am unable to find it. I reviewed his prior visit,  he was an emergency department in July of this year with a large abscess in the same area that was found on the CT scan. I will get CT scan for further evaluation.   CT scan showing 1.7 x 3.9 cm abscess with surrounding cellulitic changes. I was able to go back and through already made incision and aspirate location of the abscess. I then extended the incision in the direction of the pocket and was able to drain large amount of purulent drainage. Patient will be discharged home on Keflex, Bactrim, Norco for pain, will give follow-up with general surgery given recurrence of this abscess.  Vitals:   09/20/16 0743 09/20/16 0744 09/20/16 0745  BP:   (!) 147/120    Pulse: 86    Resp: 16    Temp: 98.1 F (36.7 C)    TempSrc: Oral    SpO2: 97%    Weight:  104.3 kg   Height:  5\' 9"  (1.753 m)       Final Clinical Impressions(s) / ED Diagnoses   Final diagnoses:  Abscess  Abscess of buttock, right  Cellulitis of buttock    New Prescriptions New Prescriptions   CEPHALEXIN (KEFLEX) 500 MG CAPSULE    Take 1 capsule (500 mg total) by mouth 4 (four) times daily.   HYDROCODONE-ACETAMINOPHEN (NORCO) 5-325 MG TABLET    Take 1 tablet by mouth every 6 (six) hours as needed for moderate pain.   SULFAMETHOXAZOLE-TRIMETHOPRIM (BACTRIM DS,SEPTRA DS) 800-160 MG TABLET    Take 1 tablet by mouth 2 (two) times daily.     Jeannett Senior, PA-C 09/20/16 [place], MD 09/20/16 [phone]

## 2016-09-20 NOTE — Discharge Instructions (Signed)
Take both antibiotics as prescribed. Take ibuprofen for pain. Take norco for severe pain. Follow up in 2-3 days for recheck. Warm compresses and tub soaks. Return if worsening.

## 2016-09-20 NOTE — ED Triage Notes (Signed)
Pt reports abscess to buttocks. Has tried to soak in Epsom salts but unable to get it to pop.

## 2016-11-15 ENCOUNTER — Encounter (HOSPITAL_COMMUNITY): Payer: Self-pay | Admitting: Emergency Medicine

## 2016-11-15 ENCOUNTER — Emergency Department (HOSPITAL_COMMUNITY)
Admission: EM | Admit: 2016-11-15 | Discharge: 2016-11-16 | Disposition: A | Discharge status: Home / Self Care | Payer: Self-pay | Attending: Emergency Medicine | Admitting: Emergency Medicine

## 2016-11-15 DIAGNOSIS — J45909 Unspecified asthma, uncomplicated: Secondary | ICD-10-CM | POA: Insufficient documentation

## 2016-11-15 DIAGNOSIS — Z79899 Other long term (current) drug therapy: Secondary | ICD-10-CM | POA: Insufficient documentation

## 2016-11-15 DIAGNOSIS — I10 Essential (primary) hypertension: Secondary | ICD-10-CM | POA: Insufficient documentation

## 2016-11-15 DIAGNOSIS — M5440 Lumbago with sciatica, unspecified side: Secondary | ICD-10-CM | POA: Insufficient documentation

## 2016-11-15 DIAGNOSIS — F17200 Nicotine dependence, unspecified, uncomplicated: Secondary | ICD-10-CM | POA: Insufficient documentation

## 2016-11-15 DIAGNOSIS — G8929 Other chronic pain: Secondary | ICD-10-CM | POA: Insufficient documentation

## 2016-11-15 HISTORY — DX: Depression, unspecified: F32.A

## 2016-11-15 HISTORY — DX: Major depressive disorder, single episode, unspecified: F32.9

## 2016-11-15 NOTE — ED Triage Notes (Signed)
Per EMS pt is c/o lower back pain that started about 9 am this morning  States pain radiates down both legs  Describes as a sharp shooting pain  Denies injury

## 2016-11-16 ENCOUNTER — Encounter (HOSPITAL_COMMUNITY): Payer: Self-pay | Admitting: Emergency Medicine

## 2016-11-16 MED ORDER — PREDNISONE 20 MG PO TABS
60.0000 mg | ORAL_TABLET | Freq: Once | ORAL | Status: AC
  Administered 2016-11-16: 60 mg via ORAL
  Filled 2016-11-16: qty 3

## 2016-11-16 MED ORDER — KETOROLAC TROMETHAMINE 60 MG/2ML IM SOLN
60.0000 mg | Freq: Once | INTRAMUSCULAR | Status: AC
  Administered 2016-11-16: 60 mg via INTRAMUSCULAR
  Filled 2016-11-16: qty 2

## 2016-11-16 MED ORDER — LIDOCAINE 5 % EX PTCH: 1.0000 | MEDICATED_PATCH | CUTANEOUS | 0 refills | Status: DC

## 2016-11-16 MED ORDER — METHOCARBAMOL 500 MG PO TABS
1000.0000 mg | ORAL_TABLET | Freq: Once | ORAL | Status: AC
  Administered 2016-11-16: 1000 mg via ORAL
  Filled 2016-11-16: qty 2

## 2016-11-16 NOTE — ED Provider Notes (Signed)
[place] DEPT Provider Note   CSN: [id number] Arrival date & time: 11/15/16  2210   By signing my name below, I, Delton Prairie, attest that this documentation has been prepared under the direction and in the presence of Caedin Mogan, MD  Electronically Signed: Delton Prairie, ED Scribe. 11/16/16. 2:24 AM.   History   Chief Complaint Chief Complaint  Patient presents with  . Back Pain   The history is provided by the patient. No language interpreter was used.  Back Pain   This is a chronic problem. The current episode started yesterday. The problem occurs constantly. The problem has not changed since onset.The pain is associated with no known injury. The pain is present in the sacro-iliac joint. The quality of the pain is described as stabbing. The pain is moderate. The symptoms are aggravated by bending. The pain is the same all the time. Pertinent negatives include no chest pain, no fever, no numbness, no weight loss, no headaches, no abdominal pain, no abdominal swelling, no bowel incontinence, no perianal numbness, no bladder incontinence, no dysuria, no pelvic pain, no leg pain, no paresthesias, no paresis, no tingling and no weakness. Treatments tried: neurontin and NSAIDs. The treatment provided no relief. Risk factors: nonne.   HPI Comments:  Benjamin Abbott is a 56 y.o. male, with a hx of asthma and HTN, who presents to the Emergency Department complaining of chronic sharp back pain which radiates to his bilateral lower extremities onset yesterday. He notes intermittent numbness to his upper extremities. Pt is on Wellbutrin, hydrochlorothiazide, Singulair, zyrtec, Mobic and Neurontin which he has taken with no relief. Pt denies dysuria, diarrhea or any other associated symptoms.   Past Medical History:  Diagnosis Date  . Asthma   . Depression   . Hypertension     There are no active problems to display for this patient.   Past Surgical History:  Procedure Laterality Date   . BOWEL RESECTION         Home Medications    Prior to Admission medications   Medication Sig Start Date End Date Taking? Authorizing Provider  albuterol (PROVENTIL HFA;VENTOLIN HFA) 108 (90 Base) MCG/ACT inhaler Inhale 2 puffs into the lungs every 6 (six) hours as needed for wheezing or shortness of breath.    Historical Provider, MD  amoxicillin-clavulanate (AUGMENTIN) 875-125 MG tablet Take 1 tablet by mouth 2 (two) times daily. One po bid x 7 days 03/27/16   Charlesetta Shanks, MD  benzonatate (TESSALON) 100 MG capsule Take 1 capsule (100 mg total) by mouth every 8 (eight) hours. Patient not taking: Reported on 03/27/2016 03/03/16   Larene Pickett, PA-C  cephALEXin (KEFLEX) 500 MG capsule Take 1 capsule (500 mg total) by mouth 4 (four) times daily. 09/20/16   Tatyana Kirichenko, PA-C  cyclobenzaprine (FLEXERIL) 10 MG tablet Take 0.5-1 tablets (5-10 mg total) by mouth 2 (two) times daily as needed. Patient not taking: Reported on 03/27/2016 02/17/16   Delos Haring, PA-C  docusate sodium (COLACE) 100 MG capsule Take 1 capsule (100 mg total) by mouth every 12 (twelve) hours. Patient not taking: Reported on 02/17/2016 06/06/15   Orlie Dakin, MD  docusate sodium (COLACE) 100 MG capsule Take 1 capsule (100 mg total) by mouth every 12 (twelve) hours. 03/27/16   Charlesetta Shanks, MD  HYDROcodone-acetaminophen (NORCO) 5-325 MG tablet Take 1 tablet by mouth every 6 (six) hours as needed for moderate pain. 09/20/16   Tatyana Kirichenko, PA-C  naproxen (NAPROSYN) 500 MG tablet Take 1 tablet (  500 mg total) by mouth 2 (two) times daily. Patient not taking: Reported on 03/27/2016 02/17/16   Delos Haring, PA-C  predniSONE (DELTASONE) 20 MG tablet Take 40 mg by mouth daily for 3 days, then 20mg  by mouth daily for 3 days, then 10mg  daily for 3 days Patient not taking: Reported on 03/27/2016 03/03/16   Larene Pickett, PA-C    Family History Family History  Problem Relation Age of Onset  . Heart attack Mother   .  Heart attack Father     Social History Social History  Substance Use Topics  . Smoking status: Current Some Day Smoker  . Smokeless tobacco: Never Used  . Alcohol use No     Allergies   Patient has no known allergies.   Review of Systems Review of Systems  Constitutional: Negative for fever and weight loss.  Respiratory: Negative for shortness of breath.   Cardiovascular: Negative for chest pain, palpitations and leg swelling.  Gastrointestinal: Negative for abdominal pain and bowel incontinence.  Genitourinary: Negative for bladder incontinence, difficulty urinating, dysuria and pelvic pain.  Musculoskeletal: Positive for back pain. Negative for gait problem and joint swelling.  Neurological: Negative for tingling, weakness, numbness, headaches and paresthesias.  All other systems reviewed and are negative.  Physical Exam Updated Vital Signs BP 113/79 (BP Location: Right Arm)   Pulse 61   Temp 97.7 F (36.5 C) (Oral)   Resp 18   SpO2 95%   Physical Exam  Constitutional: He is oriented to person, place, and time. He appears well-developed and well-nourished. No distress.  HENT:  Head: Normocephalic and atraumatic.  Mouth/Throat: Oropharynx is clear and moist. No oropharyngeal exudate.  Moist mucous membranes   Eyes: Conjunctivae and EOM are normal.  Pinpoint pupils   Neck: Normal range of motion. Neck supple. No JVD present.  Trachea midline No bruit  Cardiovascular: Normal rate, regular rhythm, normal heart sounds and intact distal pulses.   Pulmonary/Chest: Effort normal and breath sounds normal. No stridor. No respiratory distress. He has no wheezes. He has no rales.  Abdominal: Soft. Bowel sounds are normal. He exhibits no mass. There is no tenderness. There is no rebound and no guarding.  Constipation   Musculoskeletal: Normal range of motion. He exhibits no edema, tenderness or deformity.  Neurological: He is alert and oriented to person, place, and time. He  has normal reflexes. He displays normal reflexes. He exhibits normal muscle tone.  Skin: Skin is warm and dry. Capillary refill takes less than 2 seconds. Rash: .scribe.  Psychiatric: He has a normal mood and affect. His behavior is normal.  Nursing note and vitals reviewed.  ED Treatments / Results   Vitals:   11/15/16 2215 11/16/16 0023  BP: 128/85 113/79  Pulse: 75 61  Resp: 18 18  Temp: 97.7 F (36.5 C)      DIAGNOSTIC STUDIES:  Oxygen Saturation is 95% on RA, normal by my interpretation.    COORDINATION OF CARE:  2:18 AM Discussed treatment plan with pt at bedside and pt agreed to plan.   Procedures Procedures (including critical care time)  Medications Ordered in ED  Medications  predniSONE (DELTASONE) tablet 60 mg (not administered)  ketorolac (TORADOL) injection 60 mg (60 mg Intramuscular Given 11/16/16 0224)  methocarbamol (ROBAXIN) tablet 1,000 mg (1,000 mg Oral Given 11/16/16 0224)       Final Clinical Impressions(s) / ED Diagnoses  Chronic back pain:  No neuro compromise.  Already on Mobic and neurontin.  He "  wants something stronger"  Because his doctor won't write for it.  EDP explained we do not prescribe narcotics for chronic pain and that he will need to see pain management.  Continue meds previously prescribed. Return for fevers, weakness numbness inability to urinate, stooling on yourself or any concerns.  The patient appears reasonably screened and/or stabilized for discharge and I doubt any other medical condition or other [place] requiring further screening, evaluation, or treatment in the ED at this time prior to discharge. New Prescriptions New Prescriptions   No medications on file   I personally performed the services described in this documentation, which was scribed in my presence. The recorded information has been reviewed and is accurate.       Veatrice Kells, MD 11/16/16 0230

## 2016-12-19 ENCOUNTER — Ambulatory Visit: Payer: Self-pay | Admitting: Family Medicine

## 2017-03-02 ENCOUNTER — Ambulatory Visit: Payer: Self-pay | Admitting: Family Medicine

## 2017-06-08 ENCOUNTER — Encounter: Payer: Self-pay | Admitting: Family Medicine

## 2017-06-08 ENCOUNTER — Emergency Department (HOSPITAL_COMMUNITY)
Admission: EM | Admit: 2017-06-08 | Discharge: 2017-06-08 | Disposition: A | Discharge status: Home / Self Care | Payer: Medicaid Other | Attending: Emergency Medicine | Admitting: Emergency Medicine

## 2017-06-08 ENCOUNTER — Encounter (HOSPITAL_COMMUNITY): Payer: Self-pay | Admitting: Emergency Medicine

## 2017-06-08 ENCOUNTER — Ambulatory Visit (HOSPITAL_COMMUNITY)
Admission: RE | Admit: 2017-06-08 | Discharge: 2017-06-08 | Disposition: A | Discharge status: Home / Self Care | Payer: Medicaid Other | Source: Ambulatory Visit | Attending: Family Medicine | Admitting: Family Medicine

## 2017-06-08 ENCOUNTER — Ambulatory Visit (INDEPENDENT_AMBULATORY_CARE_PROVIDER_SITE_OTHER): Payer: Medicaid Other | Admitting: Family Medicine

## 2017-06-08 VITALS — BP 135/90 | HR 81 | Temp 97.8°F | Wt 229.0 lb

## 2017-06-08 DIAGNOSIS — M47816 Spondylosis without myelopathy or radiculopathy, lumbar region: Secondary | ICD-10-CM | POA: Diagnosis not present

## 2017-06-08 DIAGNOSIS — M25561 Pain in right knee: Secondary | ICD-10-CM

## 2017-06-08 DIAGNOSIS — M25562 Pain in left knee: Secondary | ICD-10-CM

## 2017-06-08 DIAGNOSIS — Z0000 Encounter for general adult medical examination without abnormal findings: Secondary | ICD-10-CM

## 2017-06-08 DIAGNOSIS — I70208 Unspecified atherosclerosis of native arteries of extremities, other extremity: Secondary | ICD-10-CM | POA: Diagnosis not present

## 2017-06-08 DIAGNOSIS — E669 Obesity, unspecified: Secondary | ICD-10-CM | POA: Diagnosis not present

## 2017-06-08 DIAGNOSIS — Z79899 Other long term (current) drug therapy: Secondary | ICD-10-CM | POA: Diagnosis not present

## 2017-06-08 DIAGNOSIS — Z6833 Body mass index (BMI) 33.0-33.9, adult: Secondary | ICD-10-CM | POA: Diagnosis not present

## 2017-06-08 DIAGNOSIS — K3189 Other diseases of stomach and duodenum: Secondary | ICD-10-CM | POA: Diagnosis not present

## 2017-06-08 DIAGNOSIS — M5441 Lumbago with sciatica, right side: Secondary | ICD-10-CM | POA: Insufficient documentation

## 2017-06-08 DIAGNOSIS — M545 Low back pain: Secondary | ICD-10-CM | POA: Diagnosis present

## 2017-06-08 DIAGNOSIS — I10 Essential (primary) hypertension: Secondary | ICD-10-CM | POA: Diagnosis not present

## 2017-06-08 DIAGNOSIS — F17200 Nicotine dependence, unspecified, uncomplicated: Secondary | ICD-10-CM | POA: Diagnosis not present

## 2017-06-08 DIAGNOSIS — J45909 Unspecified asthma, uncomplicated: Secondary | ICD-10-CM | POA: Diagnosis not present

## 2017-06-08 DIAGNOSIS — G8929 Other chronic pain: Secondary | ICD-10-CM | POA: Diagnosis not present

## 2017-06-08 DIAGNOSIS — Z7689 Persons encountering health services in other specified circumstances: Secondary | ICD-10-CM

## 2017-06-08 DIAGNOSIS — M79604 Pain in right leg: Secondary | ICD-10-CM

## 2017-06-08 DIAGNOSIS — M170 Bilateral primary osteoarthritis of knee: Secondary | ICD-10-CM | POA: Diagnosis not present

## 2017-06-08 DIAGNOSIS — M5442 Lumbago with sciatica, left side: Secondary | ICD-10-CM | POA: Insufficient documentation

## 2017-06-08 HISTORY — DX: Obesity, unspecified: E66.9

## 2017-06-08 HISTORY — DX: Polyneuropathy, unspecified: G62.9

## 2017-06-08 HISTORY — DX: Pain in unspecified knee: M25.569

## 2017-06-08 HISTORY — DX: Dorsalgia, unspecified: M54.9

## 2017-06-08 HISTORY — DX: Other chronic pain: G89.29

## 2017-06-08 LAB — POCT GLYCOSYLATED HEMOGLOBIN (HGB A1C): HEMOGLOBIN A1C: 5.4

## 2017-06-08 MED ORDER — MELOXICAM 15 MG PO TABS: 15.0000 mg | ORAL_TABLET | Freq: Every day | ORAL | 0 refills | Status: DC

## 2017-06-08 MED ORDER — GABAPENTIN 300 MG PO CAPS: 300.0000 mg | ORAL_CAPSULE | Freq: Three times a day (TID) | ORAL | 0 refills | Status: AC

## 2017-06-08 MED ORDER — CYCLOBENZAPRINE HCL 10 MG PO TABS: 10.0000 mg | ORAL_TABLET | Freq: Two times a day (BID) | ORAL | 0 refills | Status: DC | PRN

## 2017-06-08 MED ORDER — KETOROLAC TROMETHAMINE 60 MG/2ML IM SOLN
60.0000 mg | Freq: Once | INTRAMUSCULAR | Status: AC
  Administered 2017-06-08: 60 mg via INTRAMUSCULAR
  Filled 2017-06-08: qty 2

## 2017-06-08 NOTE — ED Provider Notes (Signed)
[facility] DEPT Provider Note   CSN: [id number] Arrival date & time: 06/08/17  2057     History   Chief Complaint Chief Complaint  Patient presents with  . Back Pain    HPI Benjamin Abbott is a 56 y.o. male who presents with chronic back pain and chronic bilateral knee pain. PMH of asthma, HTN, hx of colon cancer s/p bowel resection. He recently signed up for Medicaid and went to [facility] today. He previously saw "Audrea Muscat" at the [facility] who had him on Mobic and Neurontin which he has not been taking because he has been out of all his medicines. Today he reports he did not get any prescriptions for his chronic pain so he came to the ED. He reports his pain is across his entire lower back and it radiates to his bilateral legs. It feels like a sharp stabbing like being "stuck with pins". He has chronic bilateral knee pain as well. He is requesting pain management. No fever, syncope, trauma, unexplained weight loss, loss of bowel/bladder function, saddle anesthesia, urinary retention, IVDU. He had lumbar xray and bilateral knee xrays today which showed arthritis and degenerative disc disease with no acute change.    HPI  Past Medical History:  Diagnosis Date  . Asthma   . Back pain   . Chronic pain   . Colon cancer ([place])   . Depression   . Hypertension   . Knee pain   . Neuropathy   . Obesity     Patient Active Problem List   Diagnosis Date Noted  . Low back pain radiating to both legs 06/08/2017  . Asthma 06/08/2017  . Bilateral knee pain 06/08/2017  . Obesity 06/08/2017    Past Surgical History:  Procedure Laterality Date  . BOWEL RESECTION         Home Medications    Prior to Admission medications   Medication Sig Start Date End Date Taking? Authorizing Provider  albuterol (PROVENTIL HFA;VENTOLIN HFA) 108 (90 Base) MCG/ACT inhaler Inhale 2 puffs into the lungs every 6 (six) hours as needed for wheezing or shortness of breath.    [provider]  amoxicillin-clavulanate (AUGMENTIN) 875-125 MG tablet Take 1 tablet by mouth 2 (two) times daily. One po bid x 7 days 03/27/16   Charlesetta Shanks, MD  cephALEXin (KEFLEX) 500 MG capsule Take 1 capsule (500 mg total) by mouth 4 (four) times daily. 09/20/16   Kirichenko, Lahoma Rocker, PA-C  cyclobenzaprine (FLEXERIL) 10 MG tablet Take 1 tablet (10 mg total) by mouth 2 (two) times daily as needed for muscle spasms. 06/08/17   Recardo Evangelist, PA-C  docusate sodium (COLACE) 100 MG capsule Take 1 capsule (100 mg total) by mouth every 12 (twelve) hours. 03/27/16   Charlesetta Shanks, MD  Fluticasone-Salmeterol (ADVAIR) 250-50 MCG/DOSE AEPB Inhale 1 puff into the lungs 2 (two) times daily.    [provider]  gabapentin (NEURONTIN) 300 MG capsule Take 1 capsule (300 mg total) by mouth 3 (three) times daily. 06/08/17   Recardo Evangelist, PA-C  lidocaine (LIDODERM) 5 % Place 1 patch onto the skin daily. Remove & Discard patch within 12 hours or as directed by MD 11/16/16   Randal Buba, April, MD  meloxicam (MOBIC) 15 MG tablet Take 1 tablet (15 mg total) by mouth daily. 06/08/17   Recardo Evangelist, PA-C    Family History Family History  Problem Relation Age of Onset  . Heart attack Mother   . Heart attack  Father     Social History Social History  Substance Use Topics  . Smoking status: Current Some Day Smoker  . Smokeless tobacco: Never Used  . Alcohol use No     Allergies   Patient has no known allergies.   Review of Systems Review of Systems  Constitutional: Negative for fever.  Musculoskeletal: Positive for back pain and myalgias. Negative for gait problem and joint swelling.  Skin: Negative for wound.  Neurological: Negative for weakness and numbness.     Physical Exam Updated Vital Signs BP (!) 155/100 (BP Location: Left Arm)   Pulse 72   Temp 98.3 F (36.8 C) (Oral)   Resp 18   Ht 5\' 9"  (1.753 m)   Wt 104.3 kg (230 lb)   SpO2 97%   BMI 33.97 kg/m   Physical  Exam  Constitutional: He is oriented to person, place, and time. He appears well-developed and well-nourished. No distress.  HENT:  Head: Normocephalic and atraumatic.  Eyes: Pupils are equal, round, and reactive to light. Conjunctivae are normal. Right eye exhibits no discharge. Left eye exhibits no discharge. No scleral icterus.  Neck: Normal range of motion.  Cardiovascular: Normal rate.   Pulmonary/Chest: Effort normal. No respiratory distress.  Abdominal: He exhibits no distension.  Musculoskeletal:  Back: Inspection: No masses, deformity, or rash Palpation: Diffuse thoracic and lumbar midline spinal tenderness with paraspinal muscle tenderness Strength: 5/5 in lower extremities and normal plantar and dorsiflexion Sensation: Intact sensation with light touch in lower extremities bilaterally Reflexes: Patellar reflex is 2+ bilaterally SLR: Negative seated straight leg raise Gait: Slow gait. Walks with cane   Neurological: He is alert and oriented to person, place, and time.  Skin: Skin is warm and dry.  Psychiatric: He has a normal mood and affect. His behavior is normal.  Nursing note and vitals reviewed.    ED Treatments / Results  Labs (all labs ordered are listed, but only abnormal results are displayed) Labs Reviewed - No data to display  EKG  EKG Interpretation None       Radiology Dg Lumbar Spine Complete  Result Date: 06/08/2017 CLINICAL DATA:  Chronic back pain. EXAM: LUMBAR SPINE - COMPLETE 4+ VIEW COMPARISON:  CT 03/27/2016. FINDINGS: No acute bony abnormality identified. No evidence of fracture. Diffuse mile degenerative change. Aortoiliac atherosclerotic vascular calcification. Gastric distention noted. Abdominal series can be obtained to further evaluate. IMPRESSION: 1. Gastric distention. 2. Diffuse lumbar spine degenerative change. No acute abnormality. Lumbar spine is stable from prior CT of 02/16/2016. 3. Aortoiliac atherosclerotic vascular  calcification. Electronically Signed   By: Marcello Moores  Register   On: 06/08/2017 14:39   Dg Knee 3 Views Left  Result Date: 06/08/2017 CLINICAL DATA:  Ionic low back and knee pain with no known injury EXAM: LEFT KNEE - 3 VIEW COMPARISON:  None in PACs FINDINGS: The bones of the left knee are subjectively adequately mineralized. The joint spaces are well maintained. There is mild beaking of the tibial spines. A tiny spur arises from the lateral articular margin of the patella. There is no acute or healing fracture. There is no joint effusion. 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 No significant joint space loss or other bony abnormality. Electronically Signed   By: David  Martinique M.D.   On: 06/08/2017 14:37   Dg Knee 3 Views Right  Result Date: 06/08/2017 CLINICAL DATA:  Chronic low back and knee pain with no known injury. EXAM: RIGHT KNEE - 3 VIEW COMPARISON:  None in PACs FINDINGS: The bones are subjectively adequately mineralized. There is beaking of the tibial spines. A tiny spur arises from the lateral articular margin of the patella. There is no acute fracture or joint effusion. 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 No acute bony abnormality or significant joint space loss. Electronically Signed   By: David  Martinique M.D.   On: 06/08/2017 14:37    Procedures Procedures (including critical care time)  Medications Ordered in ED Medications  ketorolac (TORADOL) injection 60 mg (not administered)     Initial Impression / Assessment and Plan / ED Course  I have reviewed the triage vital signs and the nursing notes.  Pertinent labs & imaging results that were available during my care of the patient were reviewed by me and considered in my medical decision making (see chart for details).  56 year old male with acute on chronic back pain. He is hypertensive but otherwise vitals are normal. Normal neuro exam and no red  flags. He has plain films of the low back earlier today which did not show any acute abnormality. Will refill Mobic and Gabapentin for him today as well as a muscle relaxer. Toradol was given in the ED. He is also requesting refill of blood pressure medication. I do not see this on his med list. Will defer to PCP. Advised f/u with PCP.   Final Clinical Impressions(s) / ED Diagnoses   Final diagnoses:  Chronic midline low back pain with bilateral sciatica    New Prescriptions New Prescriptions   CYCLOBENZAPRINE (FLEXERIL) 10 MG TABLET    Take 1 tablet (10 mg total) by mouth 2 (two) times daily as needed for muscle spasms.   GABAPENTIN (NEURONTIN) 300 MG CAPSULE    Take 1 capsule (300 mg total) by mouth 3 (three) times daily.   MELOXICAM (MOBIC) 15 MG TABLET    Take 1 tablet (15 mg total) by mouth daily.     Recardo Evangelist, PA-C 06/08/17 2226    Davonna Belling, MD 06/09/17 0010

## 2017-06-08 NOTE — Progress Notes (Signed)
Subjective:    Patient ID: Benjamin Abbott, male    DOB: 1961/02/18, 56 y.o.   MRN: [id number]   CC: Establishing care with new PCP  HPI: Patient is a 56 yo male with a past medical history significant for asthma, chronic back, bilateral knee pain who presents today to establish care with new PCP.  Low back pain: Patient reports a history of chronic low back pain and knee pain which has limited his functionality in the past few years. Patient is currently not taking any pain medications at the moment. He was on naproxen, Mobic, Voltaren gel in the past. Patient reports he was seen by orthopedic in the past but could not remember what he was told about his back. Patient denies any urinary or bowel incontinence  Bilateral knee pain:  Patient reports bilateral knee pain secondary to arthritis. Patient reports that he was diagnosed a few years ago. He does not have recollection of any imaging done on his knees. Patient is currently not taking any medications for his knee pain. Patient denies any swelling or warmth.  Smoking status reviewed   ROS: all other systems were reviewed and are negative other than in the HPI   Past Medical History:  Diagnosis Date  . Asthma   . Depression   . Hypertension     Past Surgical History:  Procedure Laterality Date  . BOWEL RESECTION      Past medical history, surgical, family, and social history reviewed and updated in the EMR as appropriate.  Objective:  BP (!) 134/95   Pulse 81   Temp 97.8 F (36.6 C) (Oral)   Wt 229 lb (103.9 kg)   SpO2 96%   BMI 33.82 kg/m   Vitals and nursing note reviewed  General: NAD, pleasant, able to participate in exam Cardiac: RRR, normal heart sounds, no murmurs. 2+ radial and PT pulses bilaterally Respiratory: CTAB, normal effort, No wheezes, rales or rhonchi Abdomen: soft, nontender, nondistended, no hepatic or splenomegaly, +BS Extremities: no edema or cyanosis. WWP. Skin: warm and dry, no rashes  noted Neuro: alert and oriented x4, no focal deficits Psych: Normal affect and mood   Assessment & Plan:   #Lumbar pain, Chronic Patient reports chronic low back pain for the past few years with moderate limitation in functionality. Patient has been on NSAIDs in the past with minimal improvement in his symptoms. Patient would like to have stronger pain regimen. There is no imaging of lumbar region on record. Likely degenerative changes consistent with normal aging. No red flags on exam on per history. --Will order a complete lumbar Xray --Patient will return to discuss results and management plan  #Bilateral knee pain, chronic Similar presentation to above problems, likely degenerative consistent with age. Patient is also obese adding stress to his joints. No Imaging on record to assess severity of joint damage. Exam is within normal limit. --Will order bilateral knee X ray --Patient will follow up in clinic to discuss results and management plan  #Asthma, well controlled Patient denies any acute exacerbation and had been on Advair and Albuterol prn. Will continue to monitor. O2 sat is 96% today in clinic, and lung exam without any wheezing with normal WOB.  --Continue Advair 1 puff bid --Continue Albuterol as needed  #Elevated BP Blood pressure elevated today at 135/95, with a repeat of 135/90. Will recheck at next visit if still elevated will start patient on BP meds. Currently patient is not taking any BP meds.  #Obesity Patient has  a BMI of 33.8, class 1 obesity. Briefly discuss weight and needed for lifestyle change. Patient was not really interested in discussing that at today's visit as he felt he had more pressing issues.  --Will readdress at next visit.  #Health maintenance --Order Basic Lab work, CBC diff, CMP, A1c and lipid panel --Will follow up on results    Marjie Skiff, MD [place] PGY-2

## 2017-06-08 NOTE — Patient Instructions (Addendum)
It was great seeing you today! We have addressed the following issues today  1. I will do basic blood work to look at your kidney, liver function. I will check for diabetes and cholesterol as well. 2. Your BP is a little elevated today. At our next visit if it is still elevated I will most likely start you on blood pressure medication. 3. I ordered back and knee xray so we can better assess the extent of your pain  4. I want you to make an appointment to see me in clinic in the next two weeks so we can discuss the results and make appropriate changes.  If we did any lab work today, and the results require attention, either me or my nurse will get in touch with you. If everything is normal, you will get a letter in mail and a message via . If you don't hear from [place] in two weeks, please give [place] a call. Otherwise, we look forward to seeing you again at your next visit. If you have any questions or concerns before then, please call the clinic at [phone].  Please bring all your medications to every doctors visit  Sign up for My Chart to have easy access to your labs results, and communication with your Primary care physician. Please ask Front Desk for some assistance.   Please check-out at the front desk before leaving the clinic.    Take Care,   Dr. Andy Gauss  ay

## 2017-06-08 NOTE — ED Triage Notes (Signed)
BIB EMS from home, pt has hx of neuropathy and chronic knee pain/back pain. Pt is trying to switch PCP d/t getting Medicaid. Pt went to [facility] this AM to establish new PCP care. They would not give him pain medicine for his chronic pain so he called EMS this evening.

## 2017-06-08 NOTE — Discharge Instructions (Signed)
Please restart Gabapentin, Meloxicam, and Flexeril for pain Please follow up with [facility] regarding your pain

## 2017-06-08 NOTE — ED Notes (Signed)
Declined W/C at D/C and was escorted to lobby by RN. 

## 2017-06-09 LAB — LIPID PANEL
CHOLESTEROL TOTAL: 162 mg/dL (ref 100–199)
Chol/HDL Ratio: 3 ratio (ref 0.0–5.0)
HDL: 54 mg/dL (ref >39)
LDL Calculated: 92 mg/dL (ref 0–99)
Triglycerides: 80 mg/dL (ref 0–149)
VLDL Cholesterol Cal: 16 mg/dL (ref 5–40)

## 2017-06-09 LAB — CBC WITH DIFFERENTIAL/PLATELET
BASOS ABS: 0 10*3/uL (ref 0.0–0.2)
BASOS: 0 %
EOS (ABSOLUTE): 0.2 10*3/uL (ref 0.0–0.4)
Eos: 3 %
HEMOGLOBIN: 13.7 g/dL (ref 13.0–17.7)
Hematocrit: 41.3 % (ref 37.5–51.0)
IMMATURE GRANS (ABS): 0 10*3/uL (ref 0.0–0.1)
Immature Granulocytes: 0 %
LYMPHS ABS: 2.1 10*3/uL (ref 0.7–3.1)
LYMPHS: 31 %
MCH: 28.5 pg (ref 26.6–33.0)
MCHC: 33.2 g/dL (ref 31.5–35.7)
MCV: 86 fL (ref 79–97)
MONOCYTES: 7 %
Monocytes Absolute: 0.5 10*3/uL (ref 0.1–0.9)
NEUTROS ABS: 4 10*3/uL (ref 1.4–7.0)
Neutrophils: 59 %
Platelets: 246 10*3/uL (ref 150–379)
RBC: 4.8 x10E6/uL (ref 4.14–5.80)
RDW: 14.9 % (ref 12.3–15.4)
WBC: 6.8 10*3/uL (ref 3.4–10.8)

## 2017-06-09 LAB — CMP14+EGFR
A/G RATIO: 1.7 (ref 1.2–2.2)
ALBUMIN: 4.6 g/dL (ref 3.5–5.5)
ALK PHOS: 85 IU/L (ref 39–117)
ALT: 23 IU/L (ref 0–44)
AST: 18 IU/L (ref 0–40)
BILIRUBIN TOTAL: 0.4 mg/dL (ref 0.0–1.2)
BUN / CREAT RATIO: 12 (ref 9–20)
BUN: 12 mg/dL (ref 6–24)
CHLORIDE: 108 mmol/L — AB (ref 96–106)
CO2: 19 mmol/L — ABNORMAL LOW (ref 20–29)
Calcium: 9.7 mg/dL (ref 8.7–10.2)
Creatinine, Ser: 0.97 mg/dL (ref 0.76–1.27)
GFR calc non Af Amer: 88 mL/min/{1.73_m2} (ref >59)
GFR, EST AFRICAN AMERICAN: 102 mL/min/{1.73_m2} (ref >59)
GLUCOSE: 98 mg/dL (ref 65–99)
Globulin, Total: 2.7 g/dL (ref 1.5–4.5)
POTASSIUM: 4.3 mmol/L (ref 3.5–5.2)
SODIUM: 142 mmol/L (ref 134–144)
TOTAL PROTEIN: 7.3 g/dL (ref 6.0–8.5)

## 2017-06-24 ENCOUNTER — Encounter (HOSPITAL_COMMUNITY): Payer: Self-pay | Admitting: *Deleted

## 2017-06-24 ENCOUNTER — Emergency Department (HOSPITAL_COMMUNITY)
Admission: EM | Admit: 2017-06-24 | Discharge: 2017-06-24 | Disposition: A | Discharge status: Home / Self Care | Payer: Medicaid Other | Attending: Emergency Medicine | Admitting: Emergency Medicine

## 2017-06-24 DIAGNOSIS — F17200 Nicotine dependence, unspecified, uncomplicated: Secondary | ICD-10-CM | POA: Diagnosis not present

## 2017-06-24 DIAGNOSIS — J45909 Unspecified asthma, uncomplicated: Secondary | ICD-10-CM | POA: Diagnosis not present

## 2017-06-24 DIAGNOSIS — Z79899 Other long term (current) drug therapy: Secondary | ICD-10-CM | POA: Insufficient documentation

## 2017-06-24 DIAGNOSIS — H02842 Edema of right lower eyelid: Secondary | ICD-10-CM | POA: Diagnosis present

## 2017-06-24 DIAGNOSIS — H00012 Hordeolum externum right lower eyelid: Secondary | ICD-10-CM | POA: Insufficient documentation

## 2017-06-24 DIAGNOSIS — I10 Essential (primary) hypertension: Secondary | ICD-10-CM | POA: Diagnosis not present

## 2017-06-24 MED ORDER — FLUORESCEIN SODIUM 1 MG OP STRP
1.0000 | ORAL_STRIP | Freq: Once | OPHTHALMIC | Status: AC
  Administered 2017-06-24: 1 via OPHTHALMIC
  Filled 2017-06-24: qty 1

## 2017-06-24 MED ORDER — ERYTHROMYCIN 5 MG/GM OP OINT
TOPICAL_OINTMENT | Freq: Once | OPHTHALMIC | Status: AC
  Administered 2017-06-24: 1 via OPHTHALMIC
  Filled 2017-06-24: qty 3.5

## 2017-06-24 MED ORDER — DOXYCYCLINE HYCLATE 100 MG PO CAPS: 100.0000 mg | ORAL_CAPSULE | Freq: Two times a day (BID) | ORAL | 0 refills | Status: DC

## 2017-06-24 NOTE — ED Provider Notes (Signed)
[facility] DEPT Provider Note   CSN: [id number] Arrival date & time: 06/24/17  1020     History   Chief Complaint Chief Complaint  Patient presents with  . eye infection  . Insect Bite    HPI Benjamin Abbott is a 56 y.o. male who presents to the ED with right facial swelling around the eye. Patient thinks he had an insect bite. He saw his PCP 05/08/17 for his routine visit and was told to use warm compresses because it may be a stye. The area has gotten larger and more painful. Occasional blurry vision in the right. Patient is not taking or using any medication for the swelling.   HPI  Past Medical History:  Diagnosis Date  . Asthma   . Back pain   . Chronic pain   . Colon cancer ([place])   . Depression   . Hypertension   . Knee pain   . Neuropathy   . Obesity     Patient Active Problem List   Diagnosis Date Noted  . Low back pain radiating to both legs 06/08/2017  . Asthma 06/08/2017  . Bilateral knee pain 06/08/2017  . Obesity 06/08/2017    Past Surgical History:  Procedure Laterality Date  . BOWEL RESECTION         Home Medications    Prior to Admission medications   Medication Sig Start Date End Date Taking? Authorizing Provider  albuterol (PROVENTIL HFA;VENTOLIN HFA) 108 (90 Base) MCG/ACT inhaler Inhale 2 puffs into the lungs every 6 (six) hours as needed for wheezing or shortness of breath.    [provider]  amoxicillin-clavulanate (AUGMENTIN) 875-125 MG tablet Take 1 tablet by mouth 2 (two) times daily. One po bid x 7 days 03/27/16   Charlesetta Shanks, MD  cephALEXin (KEFLEX) 500 MG capsule Take 1 capsule (500 mg total) by mouth 4 (four) times daily. 09/20/16   Kirichenko, Lahoma Rocker, PA-C  cyclobenzaprine (FLEXERIL) 10 MG tablet Take 1 tablet (10 mg total) by mouth 2 (two) times daily as needed for muscle spasms. 06/08/17   Recardo Evangelist, PA-C  docusate sodium (COLACE) 100 MG capsule Take 1 capsule (100 mg total) by mouth every 12 (twelve)  hours. 03/27/16   Charlesetta Shanks, MD  doxycycline (VIBRAMYCIN) 100 MG capsule Take 1 capsule (100 mg total) by mouth 2 (two) times daily. 06/24/17   Ashley Murrain, NP  Fluticasone-Salmeterol (ADVAIR) 250-50 MCG/DOSE AEPB Inhale 1 puff into the lungs 2 (two) times daily.    [provider]  gabapentin (NEURONTIN) 300 MG capsule Take 1 capsule (300 mg total) by mouth 3 (three) times daily. 06/08/17   Recardo Evangelist, PA-C  lidocaine (LIDODERM) 5 % Place 1 patch onto the skin daily. Remove & Discard patch within 12 hours or as directed by MD 11/16/16   Randal Buba, April, MD  meloxicam (MOBIC) 15 MG tablet Take 1 tablet (15 mg total) by mouth daily. 06/08/17   Recardo Evangelist, PA-C    Family History Family History  Problem Relation Age of Onset  . Heart attack Mother   . Heart attack Father     Social History Social History  Substance Use Topics  . Smoking status: Current Some Day Smoker  . Smokeless tobacco: Never Used  . Alcohol use No     Allergies   Patient has no known allergies.   Review of Systems Review of Systems  Constitutional: Positive for chills. Negative for fever.  HENT: Positive for facial swelling. Negative for  ear discharge, ear pain and sore throat.   Eyes: Positive for photophobia, discharge, redness and visual disturbance.  Respiratory: Negative for cough and shortness of breath.   Cardiovascular: Negative for chest pain.  Gastrointestinal: Negative for abdominal pain, diarrhea, nausea and vomiting.  Musculoskeletal: Negative for neck pain.  Skin: Positive for rash (right side of face).  Neurological: Negative for syncope and headaches.  Psychiatric/Behavioral: Negative for confusion.     Physical Exam Updated Vital Signs BP 140/70 (BP Location: Left Arm)   Pulse 89   Temp 98.4 F (36.9 C) (Oral)   Resp 17   Ht 5\' 9"  (1.753 m)   Wt 104.3 kg (230 lb)   SpO2 99%   BMI 33.97 kg/m   Physical Exam  Constitutional: He appears well-developed  and well-nourished. No distress.  HENT:  Head: Atraumatic.  Eyes: Pupils are equal, round, and reactive to light. EOM are normal. Right eye exhibits exudate and hordeolum. Right conjunctiva is injected. Right conjunctiva has no hemorrhage.  Neck: Neck supple.  Cardiovascular: Normal rate.   Pulmonary/Chest: Effort normal.  Musculoskeletal: Normal range of motion.  Neurological: He is alert.  Skin: Skin is warm and dry.  Psychiatric: He has a normal mood and affect.  Nursing note and vitals reviewed.          ED Treatments / Results  Labs (all labs ordered are listed, but only abnormal results are displayed) Labs Reviewed - No data to display  Radiology No results found.  Procedures Procedures (including critical care time)  Medications Ordered in ED Medications  fluorescein ophthalmic strip 1 strip (1 strip Right Eye Given 06/24/17 1352)  erythromycin ophthalmic ointment (1 application Right Eye Given 06/24/17 1602)  Dr. Johnney Killian in to examine the patient. I will call for consult with opthalmology.   Consult with Dr. Ellie Lunch on call for opthalmology and she reviewed pictures. Will start Doxycycline and antibiotic eye ointment. Patient to apply warm wet compresses to the eye. Patient to f/u in the office on Monday at 1:30 pm. Patient stable for d/c without fever, no entrapment, no double vision. Patient agrees with plan.    Initial Impression / Assessment and Plan / ED Course  I have reviewed the triage vital signs and the nursing notes.   Final Clinical Impressions(s) / ED Diagnoses   Final diagnoses:  Hordeolum externum of right lower eyelid    New Prescriptions Discharge Medication List as of 06/24/2017  3:59 PM    START taking these medications   Details  doxycycline (VIBRAMYCIN) 100 MG capsule Take 1 capsule (100 mg total) by mouth 2 (two) times daily., Starting Sat 06/24/2017, Print         [place], [place], [place] 06/24/17 1751    Charlesetta Shanks,  MD 06/30/17 [phone]

## 2017-06-24 NOTE — Discharge Instructions (Signed)
Use the eye ointment twice a day. Do not get out in the sun while taking the antibiotic, do not take calcium while on the antibiotic. Dr. Ellie Lunch will see you for follow up in her office on Monday at 1:30pm.

## 2017-06-24 NOTE — ED Triage Notes (Signed)
EMS reports pt states something bite him near R eye yesterday, has some redness and puffiness.

## 2017-06-30 ENCOUNTER — Encounter: Payer: Self-pay | Admitting: Family Medicine

## 2017-06-30 ENCOUNTER — Ambulatory Visit (INDEPENDENT_AMBULATORY_CARE_PROVIDER_SITE_OTHER): Payer: Medicaid Other | Admitting: Family Medicine

## 2017-06-30 VITALS — BP 148/106 | HR 98 | Temp 98.4°F | Ht 69.0 in | Wt 232.8 lb

## 2017-06-30 DIAGNOSIS — I10 Essential (primary) hypertension: Secondary | ICD-10-CM | POA: Diagnosis not present

## 2017-06-30 DIAGNOSIS — Z23 Encounter for immunization: Secondary | ICD-10-CM

## 2017-06-30 MED ORDER — AMLODIPINE BESYLATE 10 MG PO TABS: 10.0000 mg | ORAL_TABLET | Freq: Every day | ORAL | 0 refills | Status: DC

## 2017-06-30 NOTE — Patient Instructions (Signed)
It was great seeing you today! We have addressed the following issues today  1. I am started you on new Blood pressure medication called amlodipine. You will take one pill a day. I will see you in two weeks to recheck yo blood pressure. 2. Continue your current meds for your back and knee pain. 3. I will refer you at our next office visit for smoking cessation.   If we did any lab work today, the results require attention, either me or my nurse will get in touch with you. If everything is normal, you will get a letter in mail and a message via . If you don't hear from [place] in two weeks, please give [place] a call. Otherwise, we look forward to seeing you again at your next visit. If you have any questions or concerns before then, please call the clinic at [phone].  Please bring all your medications to every doctors visit  Sign up for My Chart to have easy access to your labs results, and communication with your Primary care physician. Please ask Front Desk for some assistance.   Please check-out at the front desk before leaving the clinic.    Take Care,   Dr. Andy Gauss

## 2017-07-03 NOTE — Progress Notes (Signed)
Subjective:    Patient ID: Benjamin Abbott, male    DOB: May 05, 1961, 56 y.o.   MRN: [id number]   CC: Follow up on BP control and imaging results  HPI: Patient is a 56 yo male who is here today to discuss results of recent imaging studies and elevated BP.  Elevated BP: Patient was recently seen in the ED and was found to have elevated BP. At last OV patient was also hypertensive. Patient reports that he was omn BP meds in the past but has not taken any medications in a few years. Currently, patient denies any chest pain, palpitations, SOB, headaches, vision changes.   Back pain and bilateral knee pain: Patient continue to complain of debilitating low back pain as well as bilateral knee pain. Imaging was done during last OV and patient is eager to follow up on results. Patient continue to use meloxicam , gabapentin  and flexeril for his pain. Patient reports mild tingling and numbness of both arms intermittently. No urinary or bowel incontinence reported. No shooting pain traveling down his legs.  Smoking status reviewed   ROS: all other systems were reviewed and are negative other than in the HPI   Past Medical History:  Diagnosis Date  . Asthma   . Back pain   . Chronic pain   . Colon cancer ([place])   . Depression   . Hypertension   . Knee pain   . Neuropathy   . Obesity     Past Surgical History:  Procedure Laterality Date  . BOWEL RESECTION       Objective:  BP (!) 148/106   Pulse 98   Temp 98.4 F (36.9 C) (Oral)   Ht 5\' 9"  (1.753 m)   Wt 232 lb 12.8 oz (105.6 kg)   SpO2 97%   BMI 34.38 kg/m   Vitals and nursing note reviewed  General: NAD, pleasant, able to participate in exam Cardiac: RRR, normal heart sounds, no murmurs. 2+ radial and PT pulses bilaterally Respiratory: CTAB, normal effort, No wheezes, rales or rhonchi Abdomen: soft, nontender, nondistended, no hepatic or splenomegaly, +BS Extremities: No swelling noted on either knee. FADIR and FABER  performed with mild medial compartment pain elicited. Good ROM, no structural abnormality noted. Good reflexes, sensation intact.  Skin: warm and dry, no rashes noted Neuro: alert and oriented x4, no focal deficits Psych: Normal affect and mood   Assessment & Plan:   #Hypertension, uncontrolled Patient BP today is 148/106. Patient with elevated BP at last office visit. We now have two consecutive OV with recorded elevate BP, diagnostic for essential HTN. Will start patient today on BP meds with close follow up to assess any changes. --Start patient on Amlodipine 10 mg daily  --Discuss low salt diet  --Will see patient in two weeks for BP recheck  #Lumbar and bilatertal knee pain Patient had imaging done at last office visit to assess extend of any age related anatomic changes that could explain chronic pain. Both lumbar and bilateral knee X ray showed arthritic changes  That explained pain reported. Patient is requesting "stronger pain medications. Explain to patient given mild changes, he would benefit from NSAIDs therapy and exercises. No limitiation in ROM though patient use a cane for additional support. Patient will discontinue flexeril or use as needed for muscle spasm as current lumbar pain is not muscular .Will escalate therapy as needed in the future. Patient in agreement with plan. --Will continue Meloxicam 15 mg prn --Continue gabapentin 300 mg  tid --Discontinue Flexeril   #Smoking Cessation Assess smoking status with patient. He is a current smoker and reports 1/2 a pack a day. Patient is ready and would like to be refer to smoking cessation program at next office visit. --Will refer to Dr.Koval Smoking cessation clinic   Marjie Skiff, MD [facility] PGY-2

## 2017-07-14 ENCOUNTER — Ambulatory Visit (INDEPENDENT_AMBULATORY_CARE_PROVIDER_SITE_OTHER): Payer: Medicaid Other | Admitting: Family Medicine

## 2017-07-14 ENCOUNTER — Encounter: Payer: Self-pay | Admitting: Licensed Clinical Social Worker

## 2017-07-14 ENCOUNTER — Encounter: Payer: Self-pay | Admitting: Family Medicine

## 2017-07-14 VITALS — BP 120/82 | HR 86 | Temp 98.2°F | Ht 69.0 in | Wt 230.8 lb

## 2017-07-14 DIAGNOSIS — G8929 Other chronic pain: Secondary | ICD-10-CM | POA: Insufficient documentation

## 2017-07-14 DIAGNOSIS — F17200 Nicotine dependence, unspecified, uncomplicated: Secondary | ICD-10-CM | POA: Insufficient documentation

## 2017-07-14 DIAGNOSIS — M546 Pain in thoracic spine: Secondary | ICD-10-CM | POA: Diagnosis not present

## 2017-07-14 DIAGNOSIS — I10 Essential (primary) hypertension: Secondary | ICD-10-CM | POA: Insufficient documentation

## 2017-07-14 MED ORDER — AMLODIPINE BESYLATE 10 MG PO TABS: 10.0000 mg | ORAL_TABLET | Freq: Every day | ORAL | 11 refills | Status: DC

## 2017-07-14 NOTE — Progress Notes (Signed)
    Subjective:    Patient ID: Benjamin Abbott, male    DOB: Jun 15, 1961, 56 y.o.   MRN: [id number]   CC: bp check, back pain  BP Has been taking norvasc 10 mg daily.Tolerating this medicine well. Denies missing any doses. Denies lower extremity swelling. Denies chest pain, SOB, headache, vision changes.   Back pain Taking mobic and gabapentin. Reports he has arthritis in his back. He states his pain is not controlled with these medicines. Walks with cane. Has been using flexeril intermittently if pain is very bad. States he can't work because the medications he's on make him drowsy. He is currently in process of getting disability. He denies loss of bladder or bowel function, numbness/tingling in lower extremities. He localizes the pain to his mid back and lower back.  Smoking status reviewed- current every day smoker- smoking 3 cigarettes a day down from 1 pack a day  Review of Systems- see HPI   Objective:  BP 120/82 (BP Location: Left Arm, Patient Position: Sitting, Cuff Size: Large)   Pulse 86   Temp 98.2 F (36.8 C) (Oral)   Ht 5\' 9"  (1.753 m)   Wt 230 lb 12.8 oz (104.7 kg)   SpO2 95%   BMI 34.08 kg/m  Vitals and nursing note reviewed  General: well nourished, in no acute distress Cardiac: RRR, clear S1 and S2, no murmurs, rubs, or gallops Respiratory: clear to auscultation bilaterally, no increased work of breathing Back: midline tenderness to palpation of thoracic lab and lumbar back. Surrounding musculature tender to palpation. Normal ROM. Extremities: no edema or cyanosis.  Skin: warm and dry, no rashes noted Neuro: alert and oriented, no focal deficits  Assessment & Plan:    Tobacco dependence  Patient working on quitting. Praised his efforts. Discussed strategies to cut out last 3 cigarettes.  -continue encouraging smoking cessation -follow up with PCP 1 month  Chronic midline thoracic back pain  Patient's pain not controlled on mobic and gabapentin. He is  requesting referral to pain clinic. Discussed adding tylenol. Counseled on lack of evidence for opiates for back pain. Went over back rehab exercises  -rehab exercises given to patient -add tylenol -follow up 1 month w/ PCP  Essential hypertension  Well controlled on Norvasc 10 mg. At goal of <140/90  -continue current regimen -follow up 1 month w/ PCP   Return in about 4 weeks (around 08/11/2017).   Lucila Maine, DO Family Medicine Resident PGY-2

## 2017-07-14 NOTE — Assessment & Plan Note (Signed)
  Patient's pain not controlled on mobic and gabapentin. He is requesting referral to pain clinic. Discussed adding tylenol. Counseled on lack of evidence for opiates for back pain. Went over back rehab exercises  -rehab exercises given to patient -add tylenol -follow up 1 month w/ PCP

## 2017-07-14 NOTE — Assessment & Plan Note (Signed)
  Well controlled on Norvasc 10 mg. At goal of <140/90  -continue current regimen -follow up 1 month w/ PCP

## 2017-07-14 NOTE — Assessment & Plan Note (Signed)
  Patient working on quitting. Praised his efforts. Discussed strategies to cut out last 3 cigarettes.  -continue encouraging smoking cessation -follow up with PCP 1 month

## 2017-07-14 NOTE — Patient Instructions (Signed)
It was nice to meet you today!  Keep up the great work with cutting down on smoking! Your blood pressure looks perfect. Continue taking the medicine daily.  Below are some back exercises I'd like you to do to help cut down your back pain. Please do these daily.   Please return to see Dr. Andy Gauss in 1 month.  If you have questions or concerns please do not hesitate to call at [phone].  Lucila Maine, DO PGY-2, [facility] 07/14/2017 9:08 AM    Back Exercises If you have pain in your back, do these exercises 2-3 times each day or as told by your doctor. When the pain goes away, do the exercises once each day, but repeat the steps more times for each exercise (do more repetitions). If you do not have pain in your back, do these exercises once each day or as told by your doctor. Exercises Single Knee to Chest  Do these steps 3-5 times in a row for each leg: 1. Lie on your back on a firm bed or the floor with your legs stretched out. 2. Bring one knee to your chest. 3. Hold your knee to your chest by grabbing your knee or thigh. 4. Pull on your knee until you feel a gentle stretch in your lower back. 5. Keep doing the stretch for 10-30 seconds. 6. Slowly let go of your leg and straighten it.  Pelvic Tilt  Do these steps 5-10 times in a row: 1. Lie on your back on a firm bed or the floor with your legs stretched out. 2. Bend your knees so they point up to the ceiling. Your feet should be flat on the floor. 3. Tighten your lower belly (abdomen) muscles to press your lower back against the floor. This will make your tailbone point up to the ceiling instead of pointing down to your feet or the floor. 4. Stay in this position for 5-10 seconds while you gently tighten your muscles and breathe evenly.  Cat-Cow  Do these steps until your lower back bends more easily: 1. Get on your hands and knees on a firm surface. Keep your hands under your shoulders, and keep  your knees under your hips. You may put padding under your knees. 2. Let your head hang down, and make your tailbone point down to the floor so your lower back is round like the back of a cat. 3. Stay in this position for 5 seconds. 4. Slowly lift your head and make your tailbone point up to the ceiling so your back hangs low (sags) like the back of a cow. 5. Stay in this position for 5 seconds.  Press-Ups  Do these steps 5-10 times in a row: 1. Lie on your belly (face-down) on the floor. 2. Place your hands near your head, about shoulder-width apart. 3. While you keep your back relaxed and keep your hips on the floor, slowly straighten your arms to raise the top half of your body and lift your shoulders. Do not use your back muscles. To make yourself more comfortable, you may change where you place your hands. 4. Stay in this position for 5 seconds. 5. Slowly return to lying flat on the floor.  Bridges  Do these steps 10 times in a row: 1. Lie on your back on a firm surface. 2. Bend your knees so they point up to the ceiling. Your feet should be flat on the floor. 3. Tighten your butt muscles and  lift your butt off of the floor until your waist is almost as high as your knees. If you do not feel the muscles working in your butt and the back of your thighs, slide your feet 1-2 inches farther away from your butt. 4. Stay in this position for 3-5 seconds. 5. Slowly lower your butt to the floor, and let your butt muscles relax.  If this exercise is too easy, try doing it with your arms crossed over your chest. Belly Crunches  Do these steps 5-10 times in a row: 1. Lie on your back on a firm bed or the floor with your legs stretched out. 2. Bend your knees so they point up to the ceiling. Your feet should be flat on the floor. 3. Cross your arms over your chest. 4. Tip your chin a little bit toward your chest but do not bend your neck. 5. Tighten your belly muscles and slowly raise your  chest just enough to lift your shoulder blades a tiny bit off of the floor. 6. Slowly lower your chest and your head to the floor.  Back Lifts Do these steps 5-10 times in a row: 1. Lie on your belly (face-down) with your arms at your sides, and rest your forehead on the floor. 2. Tighten the muscles in your legs and your butt. 3. Slowly lift your chest off of the floor while you keep your hips on the floor. Keep the back of your head in line with the curve in your back. Look at the floor while you do this. 4. Stay in this position for 3-5 seconds. 5. Slowly lower your chest and your face to the floor.  Contact a doctor if:  Your back pain gets a lot worse when you do an exercise.  Your back pain does not lessen 2 hours after you exercise. If you have any of these problems, stop doing the exercises. Do not do them again unless your doctor says it is okay. Get help right away if:  You have sudden, very bad back pain. If this happens, stop doing the exercises. Do not do them again unless your doctor says it is okay. This information is not intended to replace advice given to you by your health care provider. Make sure you discuss any questions you have with your health care provider. Document Released: 10/15/2010 Document Revised: 02/18/2016 Document Reviewed: 11/06/2014 Elsevier Interactive Patient Education  2018 [employer].   Chronic Back Pain When back pain lasts longer than 3 months, it is called chronic back pain.The cause of your back pain may not be known. Some common causes include:  Wear and tear (degenerative disease) of the bones, ligaments, or disks in your back.  Inflammation and stiffness in your back (arthritis).  People who have chronic back pain often go through certain periods in which the pain is more intense (flare-ups). Many people can learn to manage the pain with home care. Follow these instructions at home: Pay attention to any changes in your symptoms.  Take these actions to help with your pain: Activity  Avoid bending and activities that make the problem worse.  Do not sit or stand in one place for long periods of time.  Take brief periods of rest throughout the day. This will reduce your pain. Resting in a lying or standing position is usually better than sitting to rest.  When you are resting for longer periods, mix in some mild activity or stretching between periods of rest. This will  help to prevent stiffness and pain.  Get regular exercise. Ask your health care provider what activities are safe for you.  Do not lift anything that is heavier than 10 lb (4.5 kg). Always use proper lifting technique, which includes: ? Bending your knees. ? Keeping the load close to your body. ? Avoiding twisting. Managing pain  If directed, apply ice to the painful area. Your health care provider may recommend applying ice during the first 24-48 hours after a flare-up begins. ? Put ice in a plastic bag. ? Place a towel between your skin and the bag. ? Leave the ice on for 20 minutes, 2-3 times per day.  After icing, apply heat to the affected area as often as told by your health care provider. Use the heat source that your health care provider recommends, such as a moist heat pack or a heating pad. ? Place a towel between your skin and the heat source. ? Leave the heat on for 20-30 minutes. ? Remove the heat if your skin turns bright red. This is especially important if you are unable to feel pain, heat, or cold. You may have a greater risk of getting burned.  Try soaking in a warm tub.  Take over-the-counter and prescription medicines only as told by your health care provider.  Keep all follow-up visits as told by your health care provider. This is important. Contact a health care provider if:  You have pain that is not relieved with rest or medicine. Get help right away if:  You have weakness or numbness in one or both of your legs or  feet.  You have trouble controlling your bladder or your bowels.  You have nausea or vomiting.  You have pain in your abdomen.  You have shortness of breath or you faint. This information is not intended to replace advice given to you by your health care provider. Make sure you discuss any questions you have with your health care provider. Document Released: 10/20/2004 Document Revised: 01/21/2016 Document Reviewed: 03/02/2015 Elsevier Interactive Patient Education  2018 [employer].

## 2017-07-14 NOTE — Progress Notes (Signed)
Type of Service: Social work Consult  Reason for consult: Dr. Lyndal Pulley requested social work consult. Patient needs a bus pass to get home.  A friend dropped him off but he has no money to get home.  Patient is pleasant and engaged in conversation, states he usually rides [employer] but when he called was told he was unable to add this appointment due to it not being 3 days advance notice  Issues discussed: transportation options ; Back up plans for transportation,  Intervention: Reflective listening, solutions focus strategies,  LCSW provided patient with a bus pass to get home.  He was appreciative of the assistance.   PLAN:No other services needs at this time  Casimer Lanius, LCSW Licensed Clinical Social Worker [place]   [phone] 9:24 AM

## 2017-08-14 ENCOUNTER — Other Ambulatory Visit: Payer: Self-pay

## 2017-08-14 ENCOUNTER — Encounter: Payer: Self-pay | Admitting: Family Medicine

## 2017-08-14 ENCOUNTER — Ambulatory Visit (INDEPENDENT_AMBULATORY_CARE_PROVIDER_SITE_OTHER): Payer: Medicaid Other | Admitting: Family Medicine

## 2017-08-14 VITALS — BP 150/82 | HR 68 | Temp 98.5°F | Ht 69.0 in | Wt 229.0 lb

## 2017-08-14 DIAGNOSIS — I10 Essential (primary) hypertension: Secondary | ICD-10-CM | POA: Diagnosis not present

## 2017-08-14 DIAGNOSIS — M79604 Pain in right leg: Secondary | ICD-10-CM

## 2017-08-14 DIAGNOSIS — M545 Low back pain: Secondary | ICD-10-CM | POA: Diagnosis not present

## 2017-08-14 MED ORDER — AMLODIPINE BESYLATE 10 MG PO TABS: 10.0000 mg | ORAL_TABLET | Freq: Every day | ORAL | 3 refills | Status: DC

## 2017-08-14 MED ORDER — ACETAMINOPHEN 500 MG PO TABS: 500.0000 mg | ORAL_TABLET | Freq: Four times a day (QID) | ORAL | 0 refills | Status: DC | PRN

## 2017-08-14 MED ORDER — MELOXICAM 15 MG PO TABS: 15.0000 mg | ORAL_TABLET | Freq: Every day | ORAL | 0 refills | Status: DC

## 2017-08-14 MED ORDER — AMITRIPTYLINE HCL 50 MG PO TABS: 50.0000 mg | ORAL_TABLET | Freq: Every day | ORAL | 0 refills | Status: DC

## 2017-08-14 NOTE — Progress Notes (Signed)
   Subjective:    Patient ID: Benjamin Abbott, male    DOB: Nov 23, 1960, 56 y.o.   MRN: [id number]   CC: Follow-up on chronic back pain and hypertension  HPI: Patient is a 56 year old male who presents today to discuss chronic low back pain as well as hypertension.  Chronic low back pain: Patient reports that the pain has not changed since last office visit.  He was started on gabapentin 300 mg 3 times daily but reports minimal change in symptoms.  Patient reports increased sleepiness with the medication.  Patient continued to endorse severe limitation in daily activities.  Patient reports that back exercises given to him during last office visit were unsuccessful because he  his legs "locked up" on the him.  Hypertension: Patient reports that he ran out off his amlodipine a month ago.  Patient has Medicaid but has been unable to afford the medication due to lack of income. Patient is currently enrolled in MAP.  Patient denies any dizziness, headache, tinnitus, shortness of breath, chest pain.  Smoking status reviewed   ROS: all other systems were reviewed and are negative other than in the HPI   Past Medical History:  Diagnosis Date  . Asthma   . Back pain   . Chronic pain   . Colon cancer ([place])   . Depression   . Hypertension   . Knee pain   . Neuropathy   . Obesity     Past Surgical History:  Procedure Laterality Date  . BOWEL RESECTION      Past medical history, surgical, family, and social history reviewed and updated in the EMR as appropriate.  Objective:  BP (!) 150/82   Pulse 68   Temp 98.5 F (36.9 C) (Oral)   Ht 5\' 9"  (1.753 m)   Wt 229 lb (103.9 kg)   SpO2 98%   BMI 33.82 kg/m   Vitals and nursing note reviewed  General: NAD, pleasant, able to participate in exam Cardiac: RRR, normal heart sounds, no murmurs. 2+ radial and PT pulses bilaterally Respiratory: CTAB, normal effort, No wheezes, rales or rhonchi Abdomen: soft, nontender, nondistended, no  hepatic or splenomegaly, +BS Extremities: no edema or cyanosis. WWP. Skin: warm and dry, no rashes noted Neuro: alert and oriented x4, no focal deficits Psych: Normal affect and mood   Assessment & Plan:   #Chronic low back pain, unchanged Patient reports minimal improvement in low back pain after being started on gabapentin last office visit.  Patient reports increased sleepiness.  Imaging of lumbar region showed mild degenerative changes.  Symptoms describe symptoms to be out of proportion with imaging finding.  Given given minimal changes in symptoms with gabapentin will discontinue today.  Patient is also started on back exercise which he was unable to perform. --Will start patient on amitriptyline 50 mg nightly. --Start patient on Tylenol 500 mg as needed.   --Start patient on meloxicam 15 mg as needed. --Patient will follow up in clinic in about a month.  #Hypertension, uncontrolled Today blood pressure was 150/82. Patient reports that he has been unable to take his blood pressure medication for the past month because he does not have an income and is unable to afford it.  Patient is enrolled in MAP and will get amlodipine from the health department. --Continue amlodipine 10 mg daily --We will follow-up at next office visit  Marjie Skiff, MD [place] PGY-2

## 2017-08-14 NOTE — Progress Notes (Signed)
Bus pass given to patient today while in clinic.  Leiyah Maultsby,CMA

## 2017-08-14 NOTE — Patient Instructions (Signed)
It was great seeing you today! We have addressed the following issues today  1. I am starting you on Elavil for your pain take it at night. I also prescribed tylenol and meloxicam ( take as needed because of risk of GI bleed).  2. I will discontinue gabapentin. 3. Make sure you refill your BP med. 4. I will see you in about 2-3 weeks for follow up.  If we did any lab work today, and the results require attention, either me or my nurse will get in touch with you. If everything is normal, you will get a letter in mail and a message via . If you don't hear from [place] in two weeks, please give [place] a call. Otherwise, we look forward to seeing you again at your next visit. If you have any questions or concerns before then, please call the clinic at [phone].  Please bring all your medications to every doctors visit  Sign up for My Chart to have easy access to your labs results, and communication with your Primary care physician. Please ask Front Desk for some assistance.   Please check-out at the front desk before leaving the clinic.    Take Care,   Dr. Andy Gauss

## 2017-08-30 ENCOUNTER — Telehealth: Payer: Self-pay

## 2017-08-30 NOTE — Telephone Encounter (Signed)
Nurse working with Education officer, museum to secure medications for client . Client gets medicaid so he is not eligible for MAP or [place] Med assist program . Nurse will help with a one time fill of his medications sent to [facility] and will deliver to [facility] on tomorrow Case manager will try to identify resource to help with medications

## 2017-09-01 ENCOUNTER — Ambulatory Visit: Payer: Medicaid Other | Admitting: Family Medicine

## 2017-09-02 ENCOUNTER — Emergency Department (HOSPITAL_COMMUNITY)
Admission: EM | Admit: 2017-09-02 | Discharge: 2017-09-02 | Disposition: A | Discharge status: Home / Self Care | Payer: Medicaid Other | Attending: Emergency Medicine | Admitting: Emergency Medicine

## 2017-09-02 ENCOUNTER — Other Ambulatory Visit: Payer: Self-pay

## 2017-09-02 ENCOUNTER — Encounter (HOSPITAL_COMMUNITY): Payer: Self-pay

## 2017-09-02 DIAGNOSIS — J45909 Unspecified asthma, uncomplicated: Secondary | ICD-10-CM | POA: Diagnosis not present

## 2017-09-02 DIAGNOSIS — F329 Major depressive disorder, single episode, unspecified: Secondary | ICD-10-CM | POA: Diagnosis not present

## 2017-09-02 DIAGNOSIS — Z85038 Personal history of other malignant neoplasm of large intestine: Secondary | ICD-10-CM | POA: Diagnosis not present

## 2017-09-02 DIAGNOSIS — Z79899 Other long term (current) drug therapy: Secondary | ICD-10-CM | POA: Insufficient documentation

## 2017-09-02 DIAGNOSIS — I10 Essential (primary) hypertension: Secondary | ICD-10-CM | POA: Insufficient documentation

## 2017-09-02 DIAGNOSIS — F17200 Nicotine dependence, unspecified, uncomplicated: Secondary | ICD-10-CM | POA: Diagnosis not present

## 2017-09-02 DIAGNOSIS — H6692 Otitis media, unspecified, left ear: Secondary | ICD-10-CM

## 2017-09-02 DIAGNOSIS — H60392 Other infective otitis externa, left ear: Secondary | ICD-10-CM

## 2017-09-02 DIAGNOSIS — H9202 Otalgia, left ear: Secondary | ICD-10-CM | POA: Diagnosis present

## 2017-09-02 MED ORDER — IBUPROFEN 800 MG PO TABS
800.0000 mg | ORAL_TABLET | Freq: Once | ORAL | Status: AC
  Administered 2017-09-02: 800 mg via ORAL
  Filled 2017-09-02: qty 1

## 2017-09-02 MED ORDER — AMOXICILLIN-POT CLAVULANATE 875-125 MG PO TABS: 1.0000 | ORAL_TABLET | Freq: Two times a day (BID) | ORAL | 0 refills | Status: DC

## 2017-09-02 MED ORDER — CIPROFLOXACIN-DEXAMETHASONE 0.3-0.1 % OT SUSP
4.0000 [drp] | Freq: Once | OTIC | Status: AC
  Administered 2017-09-02: 4 [drp] via OTIC
  Filled 2017-09-02: qty 7.5

## 2017-09-02 MED ORDER — AMOXICILLIN-POT CLAVULANATE 875-125 MG PO TABS
1.0000 | ORAL_TABLET | Freq: Once | ORAL | Status: AC
  Administered 2017-09-02: 1 via ORAL
  Filled 2017-09-02: qty 1

## 2017-09-02 NOTE — ED Provider Notes (Signed)
[facility] EMERGENCY DEPARTMENT Provider Note   CSN: [id number] Arrival date & time: 09/02/17  3710     History   Chief Complaint Chief Complaint  Patient presents with  . Otalgia    HPI Devonne Lalani is a 56 y.o. male history of colon cancer, hypertension, here presenting with left ear pain.  Patient states that he has been having left ear pain is constant for the last 2-3 days.  Denies any purulent drainage from it and noticed some swelling along the left face.  Patient denies any fevers or chills.  Patient denies swimming or getting the ear wet or recurrent ear infections.  Denies any fevers or chills.   The history is provided by the patient.    Past Medical History:  Diagnosis Date  . Asthma   . Back pain   . Chronic pain   . Colon cancer ([place])   . Depression   . Hypertension   . Knee pain   . Neuropathy   . Obesity     Patient Active Problem List   Diagnosis Date Noted  . Chronic midline thoracic back pain 07/14/2017  . Tobacco dependence 07/14/2017  . Essential hypertension 07/14/2017  . Low back pain radiating to both legs 06/08/2017  . Asthma 06/08/2017  . Bilateral knee pain 06/08/2017  . Obesity 06/08/2017    Past Surgical History:  Procedure Laterality Date  . BOWEL RESECTION         Home Medications    Prior to Admission medications   Medication Sig Start Date End Date Taking? Authorizing Provider  acetaminophen (TYLENOL) 500 MG tablet Take 1 tablet (500 mg total) every 6 (six) hours as needed by mouth. 08/14/17   Diallo, Abdoulaye, MD  albuterol (PROVENTIL HFA;VENTOLIN HFA) 108 (90 Base) MCG/ACT inhaler Inhale 2 puffs into the lungs every 6 (six) hours as needed for wheezing or shortness of breath.    [provider]  amitriptyline (ELAVIL) 50 MG tablet Take 1 tablet (50 mg total) at bedtime by mouth. 08/14/17   Diallo, Abdoulaye, MD  amLODipine (NORVASC) 10 MG tablet Take 1 tablet (10 mg total) by mouth daily.  07/14/17   Steve Rattler, DO  amLODipine (NORVASC) 10 MG tablet Take 1 tablet (10 mg total) daily by mouth. 08/14/17   Diallo, Abdoulaye, MD  docusate sodium (COLACE) 100 MG capsule Take 1 capsule (100 mg total) by mouth every 12 (twelve) hours. 03/27/16   Charlesetta Shanks, MD  Fluticasone-Salmeterol (ADVAIR) 250-50 MCG/DOSE AEPB Inhale 1 puff into the lungs 2 (two) times daily.    [provider]  gabapentin (NEURONTIN) 300 MG capsule Take 1 capsule (300 mg total) by mouth 3 (three) times daily. 06/08/17   Recardo Evangelist, PA-C  lidocaine (LIDODERM) 5 % Place 1 patch onto the skin daily. Remove & Discard patch within 12 hours or as directed by MD 11/16/16   Randal Buba, April, MD  meloxicam (MOBIC) 15 MG tablet Take 1 tablet (15 mg total) by mouth daily. 06/08/17   Recardo Evangelist, PA-C  meloxicam (MOBIC) 15 MG tablet Take 1 tablet (15 mg total) daily by mouth. 08/14/17   Marjie Skiff, MD    Family History Family History  Problem Relation Age of Onset  . Heart attack Mother   . Heart attack Father     Social History Social History   Tobacco Use  . Smoking status: Current Some Day Smoker  . Smokeless tobacco: Never Used  Substance Use Topics  .  Alcohol use: No  . Drug use: No     Allergies   Patient has no known allergies.   Review of Systems Review of Systems  HENT: Positive for ear pain.   All other systems reviewed and are negative.    Physical Exam Updated Vital Signs BP (!) 163/117   Pulse 78   Temp 98.1 F (36.7 C) (Oral)   Resp 20   SpO2 98%   Physical Exam  Constitutional: He is oriented to person, place, and time. He appears well-developed.  HENT:  Head: Normocephalic.  L otitis externa, possible otitis media. R TM nl   Eyes: Conjunctivae and EOM are normal. Pupils are equal, round, and reactive to light.  Neck: Normal range of motion.  Mild L cervical LAD   Cardiovascular: Normal rate, regular rhythm and normal heart sounds.    Pulmonary/Chest: Effort normal and breath sounds normal.  Abdominal: Soft. Bowel sounds are normal.  Musculoskeletal: Normal range of motion.  Neurological: He is alert and oriented to person, place, and time.  Skin: Skin is warm.  Psychiatric: He has a normal mood and affect.  Nursing note and vitals reviewed.    ED Treatments / Results  Labs (all labs ordered are listed, but only abnormal results are displayed) Labs Reviewed - No data to display  EKG  EKG Interpretation None       Radiology No results found.  Procedures Procedures (including critical care time)  Medications Ordered in ED Medications  ibuprofen (ADVIL,MOTRIN) tablet 800 mg (not administered)  ciprofloxacin-dexamethasone (CIPRODEX) 0.3-0.1 % OTIC (EAR) suspension 4 drop (not administered)  amoxicillin-clavulanate (AUGMENTIN) 875-125 MG per tablet 1 tablet (not administered)     Initial Impression / Assessment and Plan / ED Course  I have reviewed the triage vital signs and the nursing notes.  Pertinent labs & imaging results that were available during my care of the patient were reviewed by me and considered in my medical decision making (see chart for details).     Alphonsa Brickle is a 56 y.o. male here with L otitis externa and possible otitis media. I was able to visualize the TM and TM appears slightly bulging and white. I think likely externa with some inflammation of the TM. Given that there is some cervical LAD, will give augmentin in addition to ciprodex. Gave strict return precautions.   Final Clinical Impressions(s) / ED Diagnoses   Final diagnoses:  None    ED Discharge Orders    None       Drenda Freeze, MD 09/02/17 [phone]

## 2017-09-02 NOTE — Discharge Instructions (Signed)
Take augmentin twice daily for a week.   Continue ciprodex 4 drops to left ear twice daily for a week.   Avoid getting the ear wet. Put cotton ball into the ear while showering   See your doctor  Return to ER if you have worse ear pain, fever, purulent discharge from the ear

## 2017-09-02 NOTE — ED Triage Notes (Signed)
Pt states since Wednesday has had L ear pain, worse today

## 2017-09-29 ENCOUNTER — Other Ambulatory Visit: Payer: Self-pay

## 2017-09-29 ENCOUNTER — Emergency Department (HOSPITAL_COMMUNITY)
Admission: EM | Admit: 2017-09-29 | Discharge: 2017-09-29 | Disposition: A | Discharge status: Home / Self Care | Payer: Medicaid Other | Attending: Emergency Medicine | Admitting: Emergency Medicine

## 2017-09-29 ENCOUNTER — Encounter (HOSPITAL_COMMUNITY): Payer: Self-pay | Admitting: Emergency Medicine

## 2017-09-29 DIAGNOSIS — R079 Chest pain, unspecified: Secondary | ICD-10-CM | POA: Diagnosis not present

## 2017-09-29 DIAGNOSIS — Y999 Unspecified external cause status: Secondary | ICD-10-CM | POA: Diagnosis not present

## 2017-09-29 DIAGNOSIS — Y929 Unspecified place or not applicable: Secondary | ICD-10-CM | POA: Diagnosis not present

## 2017-09-29 DIAGNOSIS — L0291 Cutaneous abscess, unspecified: Secondary | ICD-10-CM

## 2017-09-29 DIAGNOSIS — Y939 Activity, unspecified: Secondary | ICD-10-CM | POA: Insufficient documentation

## 2017-09-29 DIAGNOSIS — L03115 Cellulitis of right lower limb: Secondary | ICD-10-CM

## 2017-09-29 MED ORDER — SULFAMETHOXAZOLE-TRIMETHOPRIM 800-160 MG PO TABS: 1.0000 | ORAL_TABLET | Freq: Two times a day (BID) | ORAL | 0 refills | Status: AC

## 2017-09-29 MED ORDER — LIDOCAINE-EPINEPHRINE (PF) 2 %-1:200000 IJ SOLN
10.0000 mL | Freq: Once | INTRAMUSCULAR | Status: AC
  Administered 2017-09-29: 10 mL
  Filled 2017-09-29: qty 20

## 2017-09-29 MED ORDER — SULFAMETHOXAZOLE-TRIMETHOPRIM 800-160 MG PO TABS
1.0000 | ORAL_TABLET | Freq: Once | ORAL | Status: AC
  Administered 2017-09-29: 1 via ORAL
  Filled 2017-09-29: qty 1

## 2017-09-29 NOTE — ED Triage Notes (Signed)
Pt reports being bitten by unknown insect yesterday, reports site is red and swollen this morning with "head on it." Pt denies red streaks, discharge from site.

## 2017-09-29 NOTE — Discharge Instructions (Signed)
Please read and follow all provided instructions.  Your diagnoses today include:  1. Abscess   2. Cellulitis of right lower extremity     Tests performed today include:  Vital signs. See below for your results today.   Medications prescribed:   Bactrim (trimethoprim/sulfamethoxazole) - antibiotic  You have been prescribed an antibiotic medicine: take the entire course of medicine even if you are feeling better. Stopping early can cause the antibiotic not to work.  Take any prescribed medications only as directed.   Home care instructions:   Follow any educational materials contained in this packet  Follow-up instructions: Return to the Emergency Department in 48 hours for a recheck if your symptoms are not significantly improved.  Return instructions:  Return to the Emergency Department if you have:  Fever  Worsening symptoms  Worsening pain  Worsening swelling  Redness of the skin that moves away from the affected area, especially if it streaks away from the affected area   Any other emergent concerns  Your vital signs today were: BP 115/80 (BP Location: Right Arm)    Pulse 74    Temp 98.4 F (36.9 C) (Oral)    Resp 18    Ht 5\' 9"  (1.753 m)    Wt 104.3 kg (230 lb)    SpO2 95%    BMI 33.97 kg/m  If your blood pressure (BP) was elevated above 135/85 this visit, please have this repeated by your doctor within one month. --------------

## 2017-09-29 NOTE — ED Provider Notes (Signed)
[facility] EMERGENCY DEPARTMENT Provider Note   CSN: [id number] Arrival date & time: 09/29/17  1122     History   Chief Complaint Chief Complaint  Patient presents with  . Insect Bite    HPI Benjamin Abbott is a 57 y.o. male.  Patient presents with complaint of insect bite to his right inner thigh first noted yesterday.  Patient had worsening swelling and drainage today.  Area is very tender.  This prompted emergency department visit.  No fevers, nausea or vomiting.  He has not had a history of abscesses.  No history of diabetes or immune compromise.  No treatments prior to arrival. The onset of this condition was acute. The course is constant. Aggravating factors: palpation. Alleviating factors: none.        Past Medical History:  Diagnosis Date  . Asthma   . Back pain   . Chronic pain   . Colon cancer ([place])   . Depression   . Hypertension   . Knee pain   . Neuropathy   . Obesity     Patient Active Problem List   Diagnosis Date Noted  . Chronic midline thoracic back pain 07/14/2017  . Tobacco dependence 07/14/2017  . Essential hypertension 07/14/2017  . Low back pain radiating to both legs 06/08/2017  . Asthma 06/08/2017  . Bilateral knee pain 06/08/2017  . Obesity 06/08/2017    Past Surgical History:  Procedure Laterality Date  . BOWEL RESECTION         Home Medications    Prior to Admission medications   Medication Sig Start Date End Date Taking? Authorizing Provider  acetaminophen (TYLENOL) 500 MG tablet Take 1 tablet (500 mg total) every 6 (six) hours as needed by mouth. 08/14/17   Diallo, Abdoulaye, MD  albuterol (PROVENTIL HFA;VENTOLIN HFA) 108 (90 Base) MCG/ACT inhaler Inhale 2 puffs into the lungs every 6 (six) hours as needed for wheezing or shortness of breath.    [provider]  amitriptyline (ELAVIL) 50 MG tablet Take 1 tablet (50 mg total) at bedtime by mouth. 08/14/17   Diallo, Abdoulaye, MD  amLODipine  (NORVASC) 10 MG tablet Take 1 tablet (10 mg total) by mouth daily. 07/14/17   Steve Rattler, DO  amLODipine (NORVASC) 10 MG tablet Take 1 tablet (10 mg total) daily by mouth. 08/14/17   Diallo, Abdoulaye, MD  amoxicillin-clavulanate (AUGMENTIN) 875-125 MG tablet Take 1 tablet by mouth 2 (two) times daily. One po bid x 7 days 09/02/17   Drenda Freeze, MD  docusate sodium (COLACE) 100 MG capsule Take 1 capsule (100 mg total) by mouth every 12 (twelve) hours. 03/27/16   Charlesetta Shanks, MD  Fluticasone-Salmeterol (ADVAIR) 250-50 MCG/DOSE AEPB Inhale 1 puff into the lungs 2 (two) times daily.    [provider]  gabapentin (NEURONTIN) 300 MG capsule Take 1 capsule (300 mg total) by mouth 3 (three) times daily. 06/08/17   Recardo Evangelist, PA-C  lidocaine (LIDODERM) 5 % Place 1 patch onto the skin daily. Remove & Discard patch within 12 hours or as directed by MD 11/16/16   Randal Buba, April, MD  meloxicam (MOBIC) 15 MG tablet Take 1 tablet (15 mg total) by mouth daily. 06/08/17   Recardo Evangelist, PA-C  meloxicam (MOBIC) 15 MG tablet Take 1 tablet (15 mg total) daily by mouth. 08/14/17   Diallo, Earna Coder, MD  sulfamethoxazole-trimethoprim (BACTRIM DS,SEPTRA DS) 800-160 MG tablet Take 1 tablet by mouth 2 (two) times daily for 7 days. 09/29/17  10/06/17  Carlisle Cater, PA-C    Family History Family History  Problem Relation Age of Onset  . Heart attack Mother   . Heart attack Father     Social History Social History   Tobacco Use  . Smoking status: Current Some Day Smoker  . Smokeless tobacco: Never Used  Substance Use Topics  . Alcohol use: No  . Drug use: Yes    Types: Marijuana     Allergies   Patient has no known allergies.   Review of Systems Review of Systems  Constitutional: Negative for fever.  Gastrointestinal: Negative for nausea and vomiting.  Skin: Positive for color change.       Positive for abscess  Hematological: Negative for adenopathy.     Physical  Exam Updated Vital Signs BP 115/80 (BP Location: Right Arm)   Pulse 74   Temp 98.4 F (36.9 C) (Oral)   Resp 18   Ht 5\' 9"  (1.753 m)   Wt 104.3 kg (230 lb)   SpO2 95%   BMI 33.97 kg/m   Physical Exam  Constitutional: He appears well-developed and well-nourished.  HENT:  Head: Normocephalic and atraumatic.  Eyes: Conjunctivae are normal.  Neck: Normal range of motion. Neck supple.  Pulmonary/Chest: No respiratory distress.  Neurological: He is alert.  Skin: Skin is warm and dry.  2 cm x 3 cm area of induration to the inner right anterior thigh consistent with abscess.  There is active drainage.  There is surrounding cellulitis extending approximately 8 cm from the center of the abscess distally.   Psychiatric: He has a normal mood and affect.  Nursing note and vitals reviewed.    ED Treatments / Results   Procedures .Marland KitchenIncision and Drainage Date/Time: 09/29/2017 1:24 PM Performed by: Carlisle Cater, PA-C Authorized by: Carlisle Cater, PA-C   Consent:    Consent obtained:  Verbal   Consent given by:  Patient   Risks discussed:  Bleeding, incomplete drainage, infection and pain   Alternatives discussed:  No treatment Location:    Type:  Abscess   Size:  2cm x 3cm   Location:  Lower extremity   Lower extremity location:  Leg   Leg location:  R upper leg Pre-procedure details:    Skin preparation:  Betadine Anesthesia (see MAR for exact dosages):    Anesthesia method:  Local infiltration   Local anesthetic:  Lidocaine 2% WITH epi Procedure type:    Complexity:  Simple Procedure details:    Incision types:  Stab incision and single straight   Incision depth:  Subcutaneous   Scalpel blade:  11   Wound management:  Probed and deloculated   Drainage:  Purulent and bloody   Drainage amount:  Scant   Wound treatment:  Wound left open Post-procedure details:    Patient tolerance of procedure:  Tolerated well, no immediate complications   (including critical care  time)  Medications Ordered in ED Medications  lidocaine-EPINEPHrine (XYLOCAINE W/EPI) 2 %-1:200000 (PF) injection 10 mL (not administered)  sulfamethoxazole-trimethoprim (BACTRIM DS,SEPTRA DS) 800-160 MG per tablet 1 tablet (not administered)     Initial Impression / Assessment and Plan / ED Course  I have reviewed the triage vital signs and the nursing notes.  Pertinent labs & imaging results that were available during my care of the patient were reviewed by me and considered in my medical decision making (see chart for details).      Patient seen and examined. Discussed I&D procedure and patient agrees to proceed.  Will give dose of Bactrim here and for home.  Vital signs reviewed and are as follows: BP 115/80 (BP Location: Right Arm)   Pulse 74   Temp 98.4 F (36.9 C) (Oral)   Resp 18   Ht 5\' 9"  (1.753 m)   Wt 104.3 kg (230 lb)   SpO2 95%   BMI 33.97 kg/m   The patient was urged to return to the Emergency Department urgently with worsening pain, swelling, expanding erythema especially if it streaks away from the affected area, fever, or if they have any other concerns.   The patient was urged to return to the Emergency Department or go to their PCP in 48 hours for wound recheck if the area is not significantly improved.  The patient verbalized understanding and stated agreement with this plan.      Final Clinical Impressions(s) / ED Diagnoses   Final diagnoses:  Abscess  Cellulitis of right lower extremity   Patient with skin abscess amenable to incision and drainage.  There is surrounding cellulitis.  Patient started on antibiotics for this reason.  No systemic symptoms.   ED Discharge Orders        Ordered    sulfamethoxazole-trimethoprim (BACTRIM DS,SEPTRA DS) 800-160 MG tablet  2 times daily     09/29/17 1308       Carlisle Cater, PA-C 09/29/17 1326    Duffy Bruce, MD 09/29/17 2032

## 2017-10-19 ENCOUNTER — Ambulatory Visit (INDEPENDENT_AMBULATORY_CARE_PROVIDER_SITE_OTHER): Payer: Medicaid Other | Admitting: Family Medicine

## 2017-10-19 ENCOUNTER — Encounter: Payer: Self-pay | Admitting: Family Medicine

## 2017-10-19 ENCOUNTER — Other Ambulatory Visit: Payer: Self-pay

## 2017-10-19 VITALS — BP 118/80 | HR 90 | Temp 98.4°F | Ht 69.0 in | Wt 225.0 lb

## 2017-10-19 DIAGNOSIS — M545 Low back pain: Secondary | ICD-10-CM

## 2017-10-19 DIAGNOSIS — M79605 Pain in left leg: Secondary | ICD-10-CM

## 2017-10-19 NOTE — Patient Instructions (Signed)
It was great seeing you today! We have addressed the following issues today  1. Please make sure you discuss with your lawyer options to afford your medications.  If we did any lab work today, and the results require attention, either me or my nurse will get in touch with you. If everything is normal, you will get a letter in mail and a message via . If you don't hear from [place] in two weeks, please give [place] a call. Otherwise, we look forward to seeing you again at your next visit. If you have any questions or concerns before then, please call the clinic at [phone].  Please bring all your medications to every doctors visit  Sign up for My Chart to have easy access to your labs results, and communication with your Primary care physician. Please ask Front Desk for some assistance.   Please check-out at the front desk before leaving the clinic.    Take Care,   Dr. Andy Gauss

## 2017-10-19 NOTE — Progress Notes (Signed)
   Subjective:    Patient ID: Benjamin Abbott, male    DOB: [birth year], 57 y.o.   MRN: [id number]   CC: Follow up for back pain   HPI: Patient is a 57 yo old male who present today to follow up on chronic low back pain with bilateral radiculopathy. Patient reports since last visit pain has been the same or even worst. He has not been able to afford his medications after he was sent to [employer] fopr drug assistance. Patient was started on Meloxicam and Amitriptiline but has not been able to take them. Patient has medicaid but is unable to afford the 3$ copay. He is currently in the process of applying for disability and and also trying to find options to help him afford his medications. Patient is currently using a cane for ambulation. He denies any saddle anesthesia, urinary or stool incontinence. Patient is requesting rolling walker today given increased reliance on cane and pain in his hand.  Smoking status reviewed   ROS: all other systems were reviewed and are negative other than in the HPI   Past Medical History:  Diagnosis Date  . Asthma   . Back pain   . Chronic pain   . Colon cancer ([place])   . Depression   . Hypertension   . Knee pain   . Neuropathy   . Obesity     Past Surgical History:  Procedure Laterality Date  . BOWEL RESECTION      Past medical history, surgical, family, and social history reviewed and updated in the EMR as appropriate.  Objective:  BP 118/80   Pulse 90   Temp 98.4 F (36.9 C) (Oral)   Ht 5\' 9"  (1.753 m)   Wt 225 lb (102.1 kg)   SpO2 97%   BMI 33.23 kg/m   Vitals and nursing note reviewed  General: NAD, pleasant, able to participate in exam Cardiac: RRR, normal heart sounds, no murmurs. 2+ radial and PT pulses bilaterally Respiratory: CTAB, normal effort, No wheezes, rales or rhonchi Abdomen: soft, nontender, nondistended, no hepatic or splenomegaly, +BS Extremities: no edema or cyanosis. WWP. Skin: warm and dry, no rashes noted Neuro: alert  and oriented x4, no focal deficits Psych: Normal affect and mood   Assessment & Plan:   #Chronic lumbar pain with bilateral radiculopathy Patient presents with continued low back pain. Patient unable to afford medications, however actively searching for program that could help with his medications. Patient is due in court next week for disability hearing. Lumbar Xray did not show any major findings explaining dispropotionate pain describe by patient. It is my impression that pain describe by patient is related to current disability hearing. Patient will follow up after disability hearing. Rolling walker was prescribed to replace gain for increase gait stability.    Marjie Skiff, MD [facility] PGY-2

## 2017-10-23 ENCOUNTER — Telehealth: Payer: Self-pay | Admitting: *Deleted

## 2017-10-23 NOTE — Telephone Encounter (Signed)
Darlina Guys at [facility] notified via [employer] that order has been placed in [place] for rolling walker. Hubbard Hartshorn, RN, BSN

## 2017-11-27 ENCOUNTER — Ambulatory Visit: Payer: Medicaid Other | Admitting: Family Medicine

## 2017-12-04 ENCOUNTER — Other Ambulatory Visit: Payer: Self-pay

## 2017-12-04 ENCOUNTER — Ambulatory Visit (INDEPENDENT_AMBULATORY_CARE_PROVIDER_SITE_OTHER): Payer: Medicaid Other | Admitting: Family Medicine

## 2017-12-04 ENCOUNTER — Encounter: Payer: Self-pay | Admitting: Family Medicine

## 2017-12-04 VITALS — BP 128/84 | HR 70 | Temp 98.2°F | Wt 212.0 lb

## 2017-12-04 DIAGNOSIS — M25562 Pain in left knee: Secondary | ICD-10-CM

## 2017-12-04 DIAGNOSIS — M25561 Pain in right knee: Secondary | ICD-10-CM | POA: Diagnosis not present

## 2017-12-04 DIAGNOSIS — G8929 Other chronic pain: Secondary | ICD-10-CM | POA: Diagnosis not present

## 2017-12-04 NOTE — Progress Notes (Signed)
   Subjective:    Patient ID: Benjamin Abbott, male    DOB: 1961/03/19, 57 y.o.   MRN: [id number]   CC: Bilateral knee pain    HPI: Patient is a 57 year old male with past medical history significant for lumbar and bilateral knee pain who presents today complaining of bilateral knee pain right greater than left.  Since last office visit, patient has been unable to fulfill amitriptyline prescription and therefore has continued to surface of bilateral knee pain.  Patient has been using a rolling walker since last office visit.  Reports that right knee has been buckling/giving out on him.  Patient has been using over-the-counter pain relieving medication no significant changes.  Patient is in the process of disability approval.  Patient appears to be in satisfactory all medication reduction provided to him so for for his chronic back and knee pain.  Smoking status reviewed   ROS: all other systems were reviewed and are negative other than in the HPI   Past Medical History:  Diagnosis Date  . Asthma   . Back pain   . Chronic pain   . Colon cancer ([place])   . Depression   . Hypertension   . Knee pain   . Neuropathy   . Obesity     Past Surgical History:  Procedure Laterality Date  . BOWEL RESECTION      Past medical history, surgical, family, and social history reviewed and updated in the EMR as appropriate.  Objective:  BP 128/84   Pulse 70   Temp 98.2 F (36.8 C) (Oral)   Wt 212 lb (96.2 kg)   SpO2 98%   BMI 31.31 kg/m   Vitals and nursing note reviewed  General: NAD, pleasant, able to participate in exam Cardiac: RRR, normal heart sounds, no murmurs. 2+ radial and PT pulses bilaterally Respiratory: CTAB, normal effort, No wheezes, rales or rhonchi Abdomen: soft, nontender, nondistended, no hepatic or splenomegaly, +BS Extremities: no edema or cyanosis. WWP. Skin: warm and dry, no rashes noted Neuro: alert and oriented x4, no focal deficits Psych: Normal affect and  mood   Assessment & Plan:   #Bilateral knee pain, R>L, chronic, worsening Patient has had chronic bilateral knee pain lumbar and cervical pain.  Patient has tried multiple NSAIDs, exercises, and was recently prescribed amitriptyline which she has been unable to fill.  Patient was recently also prescribed walking which she has been using to decrease stress.  Patient reports that patient has no change in severity. Based patient has had imaging which showed minimal arthritic changes in his knees.  Pain described as out of proportion with imaging findings.  No concern for infection.  Patient has more degenerative changes is fine that he has in his knees. Patient is willing to refill amitriptyline today in follow-up in a few weeks to reassess any changes in status.  We will provide hinged brace for right knee which patient reported has been giving out on him.  We will follow-up in the next few weeks. This patient will be difficult to satisfy with limited treatment option available to him given limitations in resources and prior treatment offered.   Marjie Skiff, MD [place] PGY-2

## 2017-12-04 NOTE — Patient Instructions (Signed)
It was great seeing you today! We have addressed the following issues today  1. You can go to sport medicine they will have a knee brace for you. 2. For the back and knee pain, try amitryptiline as prescribed.   If we did any lab work today, and the results require attention, either me or my nurse will get in touch with you. If everything is normal, you will get a letter in mail and a message via . If you don't hear from [place] in two weeks, please give [place] a call. Otherwise, we look forward to seeing you again at your next visit. If you have any questions or concerns before then, please call the clinic at [phone].  Please bring all your medications to every doctors visit  Sign up for My Chart to have easy access to your labs results, and communication with your Primary care physician. Please ask Front Desk for some assistance.   Please check-out at the front desk before leaving the clinic.    Take Care,   Dr. Andy Gauss

## 2018-02-28 ENCOUNTER — Ambulatory Visit (INDEPENDENT_AMBULATORY_CARE_PROVIDER_SITE_OTHER): Payer: Medicaid Other | Admitting: Family Medicine

## 2018-02-28 ENCOUNTER — Encounter: Payer: Self-pay | Admitting: Family Medicine

## 2018-02-28 ENCOUNTER — Other Ambulatory Visit: Payer: Self-pay

## 2018-02-28 VITALS — BP 124/70 | HR 90 | Temp 98.2°F | Ht 69.0 in | Wt 195.0 lb

## 2018-02-28 DIAGNOSIS — M25562 Pain in left knee: Secondary | ICD-10-CM | POA: Diagnosis not present

## 2018-02-28 DIAGNOSIS — M545 Low back pain: Secondary | ICD-10-CM

## 2018-02-28 DIAGNOSIS — I10 Essential (primary) hypertension: Secondary | ICD-10-CM | POA: Diagnosis not present

## 2018-02-28 DIAGNOSIS — M79604 Pain in right leg: Secondary | ICD-10-CM

## 2018-02-28 DIAGNOSIS — G8929 Other chronic pain: Secondary | ICD-10-CM | POA: Diagnosis not present

## 2018-02-28 DIAGNOSIS — J45909 Unspecified asthma, uncomplicated: Secondary | ICD-10-CM

## 2018-02-28 DIAGNOSIS — M25561 Pain in right knee: Secondary | ICD-10-CM | POA: Diagnosis not present

## 2018-02-28 DIAGNOSIS — R630 Anorexia: Secondary | ICD-10-CM

## 2018-02-28 MED ORDER — ALBUTEROL SULFATE HFA 108 (90 BASE) MCG/ACT IN AERS: 2.0000 | INHALATION_SPRAY | Freq: Four times a day (QID) | RESPIRATORY_TRACT | 1 refills | Status: AC | PRN

## 2018-02-28 MED ORDER — AMITRIPTYLINE HCL 50 MG PO TABS: 50.0000 mg | ORAL_TABLET | Freq: Every day | ORAL | 0 refills | Status: DC

## 2018-02-28 MED ORDER — FLUTICASONE-SALMETEROL 250-50 MCG/DOSE IN AEPB: 1.0000 | INHALATION_SPRAY | Freq: Two times a day (BID) | RESPIRATORY_TRACT | 1 refills | Status: AC

## 2018-02-28 NOTE — Patient Instructions (Signed)
It was great seeing you today! We have addressed the following issues today  1. I refer you to sports medicine for your back and knee brace. 2. I refilled you medication as requested. 3. I will also do blood work today and follow up on the results.  If we did any lab work today, and the results require attention, either me or my nurse will get in touch with you. If everything is normal, you will get a letter in mail and a message via . If you don't hear from [place] in two weeks, please give [place] a call. Otherwise, we look forward to seeing you again at your next visit. If you have any questions or concerns before then, please call the clinic at [phone].  Please bring all your medications to every doctors visit  Sign up for My Chart to have easy access to your labs results, and communication with your Primary care physician. Please ask Front Desk for some assistance.   Please check-out at the front desk before leaving the clinic.    Take Care,   Dr. Andy Gauss

## 2018-02-28 NOTE — Assessment & Plan Note (Signed)
Patient reports and lack of appetite the past few weeks.  He also has noticed weight loss.  Since last office visit patient has lost 30 pounds.  Patient denies any night sweats, nausea, vomiting, chest pain, abdominal pain.  Patient appears well today clinic.  Will check basic lab for initial work-up for fatigue and weight loss concerning for possible malignancy versus inflammatory process. --Order CBC and BMP will follow up on results.

## 2018-02-28 NOTE — Progress Notes (Signed)
   Subjective:    Patient ID: Kwali Wrinkle, male    DOB: Sep 11, 1961, 57 y.o.   MRN: [id number]   CC: Follow-up on bilateral knee pain and back pain  HPI: Patient is a 57 year old male who presents today to follow-up on chronic back pain as well as bilateral knee pain.  Patient reports that since last office visit left knee pain has improved with the use of a brace.  He does report that brace is too tight and has given him a blister in the back of his knee.  Patient would like to get a new knee brace for his knee.  Patient also continues to endorse low back pain.  He was finally able to fill amitriptyline prescription to [facility].  Patient reports that pain control has improved with the medication.  Patient would like to obtain a brace for his right knee and his back for additional support.  Smoking status reviewed   ROS: all other systems were reviewed and are negative other than in the HPI   Past Medical History:  Diagnosis Date  . Asthma   . Back pain   . Chronic pain   . Colon cancer ([place])   . Depression   . Hypertension   . Knee pain   . Neuropathy   . Obesity     Past Surgical History:  Procedure Laterality Date  . BOWEL RESECTION      Past medical history, surgical, family, and social history reviewed and updated in the EMR as appropriate.  Objective:  BP 124/70   Pulse 90   Temp 98.2 F (36.8 C) (Oral)   Ht 5\' 9"  (1.753 m)   Wt 195 lb (88.5 kg)   SpO2 95%   BMI 28.80 kg/m   Vitals and nursing note reviewed  General: NAD, pleasant, able to participate in exam Cardiac: RRR, normal heart sounds, no murmurs. 2+ radial and PT pulses bilaterally Respiratory: CTAB, normal effort, No wheezes, rales or rhonchi Abdomen: soft, nontender, nondistended, no hepatic or splenomegaly, +BS Extremities: no edema or cyanosis. WWP. Skin: warm and dry, no rashes noted Neuro: alert and oriented x4, no focal deficits Psych: Normal affect and mood   Assessment &  Plan:    Bilateral knee pain Patient continue to report mild to moderate knee pain bilaterally with right worse than left he describes the pain as sharp that awakens him at night.  He feels that left knee pain is better controlled with amitriptyline and the use of brace.  Patient would like to have a brace for his right knee.  Imaging has been unremarkable for pain described by patient.  He does have mild arthritic changes but not significant enough to warrant aggressive treatment. --We will refer to sports medicine for further evaluation, patient can also obtain knee brace.  Lack of appetite Patient reports and lack of appetite the past few weeks.  He also has noticed weight loss.  Since last office visit patient has lost 30 pounds.  Patient denies any night sweats, nausea, vomiting, chest pain, abdominal pain.  Patient appears well today clinic.  Will check basic lab for initial work-up for fatigue and weight loss concerning for possible malignancy versus inflammatory process. --Order CBC and BMP will follow up on results.    Marjie Skiff, MD [place] PGY-2

## 2018-02-28 NOTE — Assessment & Plan Note (Signed)
Patient continue to report mild to moderate knee pain bilaterally with right worse than left he describes the pain as sharp that awakens him at night.  He feels that left knee pain is better controlled with amitriptyline and the use of brace.  Patient would like to have a brace for his right knee.  Imaging has been unremarkable for pain described by patient.  He does have mild arthritic changes but not significant enough to warrant aggressive treatment. --We will refer to sports medicine for further evaluation, patient can also obtain knee brace.

## 2018-03-01 LAB — CBC WITH DIFFERENTIAL/PLATELET
BASOS ABS: 0 10*3/uL (ref 0.0–0.2)
Basos: 0 %
EOS (ABSOLUTE): 0.2 10*3/uL (ref 0.0–0.4)
Eos: 3 %
HEMOGLOBIN: 14 g/dL (ref 13.0–17.7)
Hematocrit: 42.6 % (ref 37.5–51.0)
Immature Grans (Abs): 0 10*3/uL (ref 0.0–0.1)
Immature Granulocytes: 0 %
Lymphocytes Absolute: 2.2 10*3/uL (ref 0.7–3.1)
Lymphs: 33 %
MCH: 28.4 pg (ref 26.6–33.0)
MCHC: 32.9 g/dL (ref 31.5–35.7)
MCV: 86 fL (ref 79–97)
MONOS ABS: 0.5 10*3/uL (ref 0.1–0.9)
Monocytes: 7 %
NEUTROS ABS: 3.9 10*3/uL (ref 1.4–7.0)
Neutrophils: 57 %
Platelets: 266 10*3/uL (ref 150–450)
RBC: 4.93 x10E6/uL (ref 4.14–5.80)
RDW: 14.9 % (ref 12.3–15.4)
WBC: 6.8 10*3/uL (ref 3.4–10.8)

## 2018-03-01 LAB — BASIC METABOLIC PANEL
BUN / CREAT RATIO: 7 — AB (ref 9–20)
BUN: 7 mg/dL (ref 6–24)
CO2: 21 mmol/L (ref 20–29)
CREATININE: 0.95 mg/dL (ref 0.76–1.27)
Calcium: 9.5 mg/dL (ref 8.7–10.2)
Chloride: 106 mmol/L (ref 96–106)
GFR calc Af Amer: 104 mL/min/{1.73_m2} (ref >59)
GFR, EST NON AFRICAN AMERICAN: 90 mL/min/{1.73_m2} (ref >59)
GLUCOSE: 87 mg/dL (ref 65–99)
POTASSIUM: 4.4 mmol/L (ref 3.5–5.2)
Sodium: 141 mmol/L (ref 134–144)

## 2018-04-20 ENCOUNTER — Encounter

## 2018-07-23 ENCOUNTER — Other Ambulatory Visit: Payer: Self-pay

## 2018-07-23 DIAGNOSIS — I10 Essential (primary) hypertension: Secondary | ICD-10-CM

## 2018-07-23 MED ORDER — AMLODIPINE BESYLATE 10 MG PO TABS: 10.0000 mg | ORAL_TABLET | Freq: Every day | ORAL | 11 refills | Status: DC

## 2018-11-24 IMAGING — DX DG KNEE 3 VIEWS*L*
3 series · 3 of 3 positions shown · non-contrast
Comparison: None in PACs

CLINICAL DATA: Ionic low back and knee pain with no known injury

EXAM:
LEFT KNEE - 3 VIEW

[knee ap]
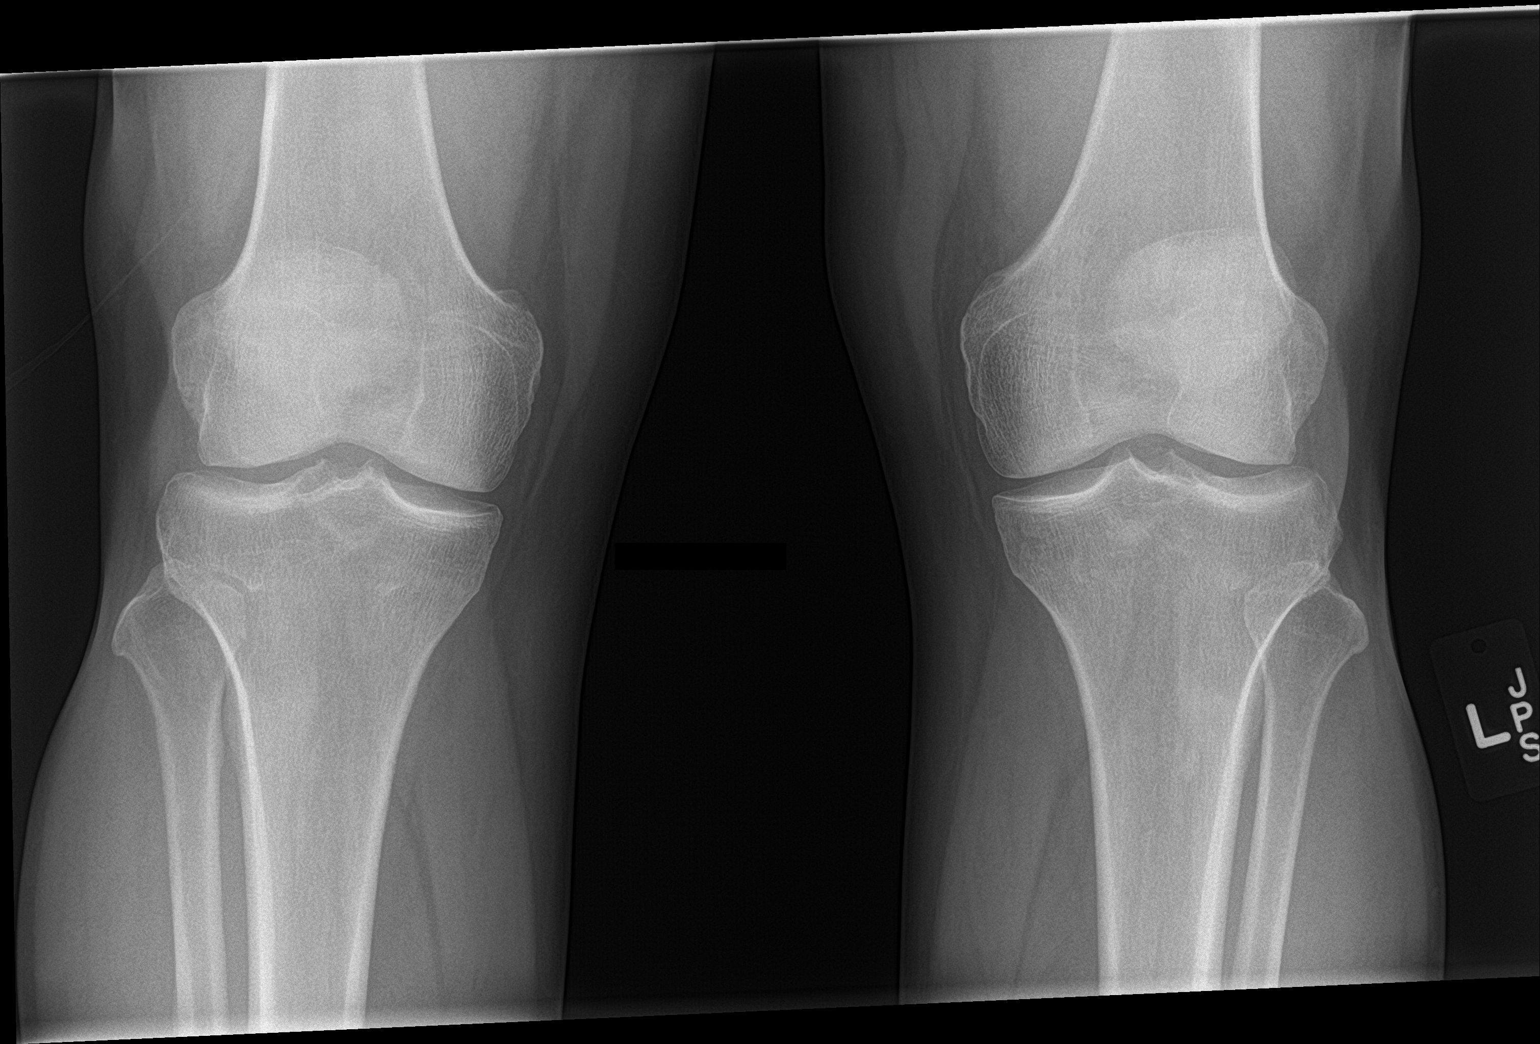

[knee lat]
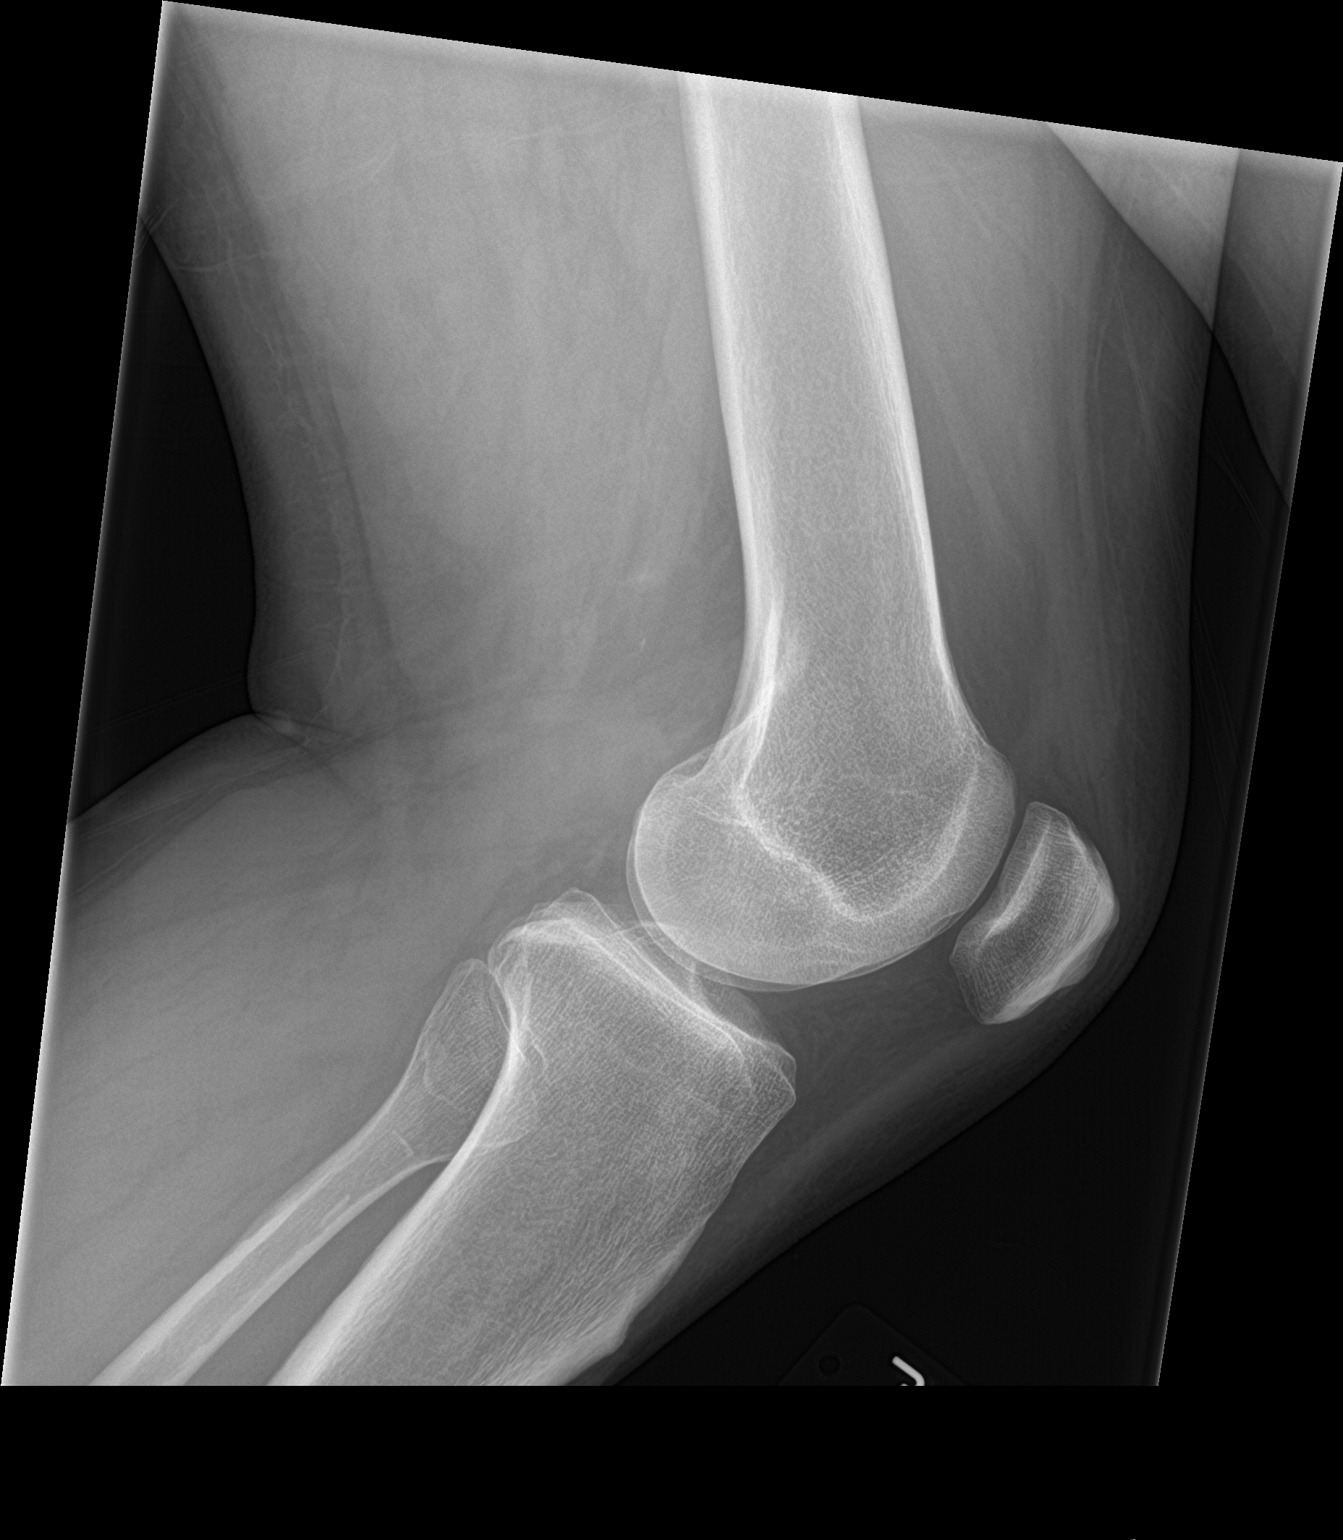

[knee sunrise]
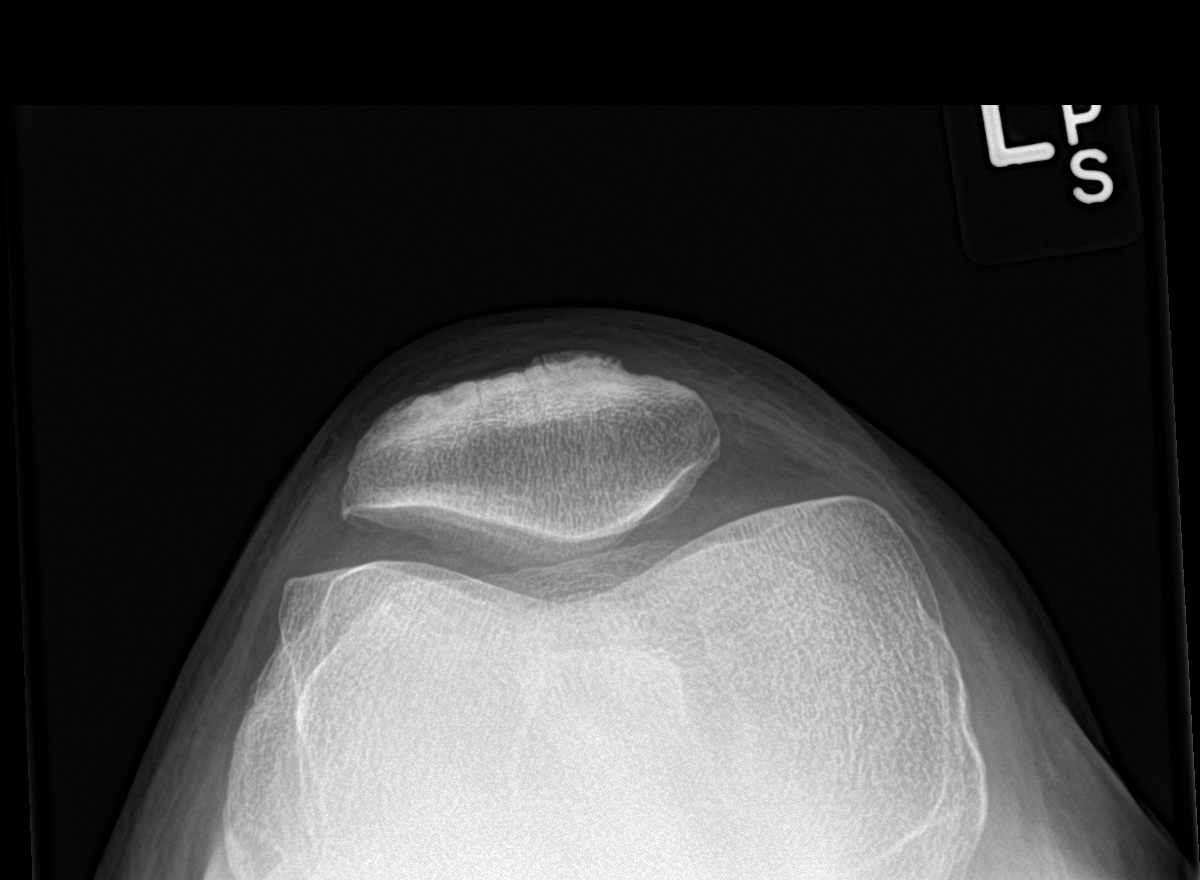

[3 of 3 positions shown; findings below may reference images not displayed]

FINDINGS: The bones of the left knee are subjectively adequately mineralized.
The joint spaces are well maintained. There is mild beaking of the
tibial spines. A tiny spur arises from the lateral articular margin
of the patella. There is no acute or healing fracture. There is no
joint effusion.
IMPRESSION: Minimal osteoarthritic spurring of the tibial spines and patella. No
significant joint space loss or other bony abnormality.

## 2018-11-24 IMAGING — DX DG LUMBAR SPINE COMPLETE 4+V
5 series · 5 of 5 positions shown · non-contrast
Comparison: CT 03/27/2016.

CLINICAL DATA: Chronic back pain.

EXAM:
LUMBAR SPINE - COMPLETE 4+ VIEW

[l-spine ap]
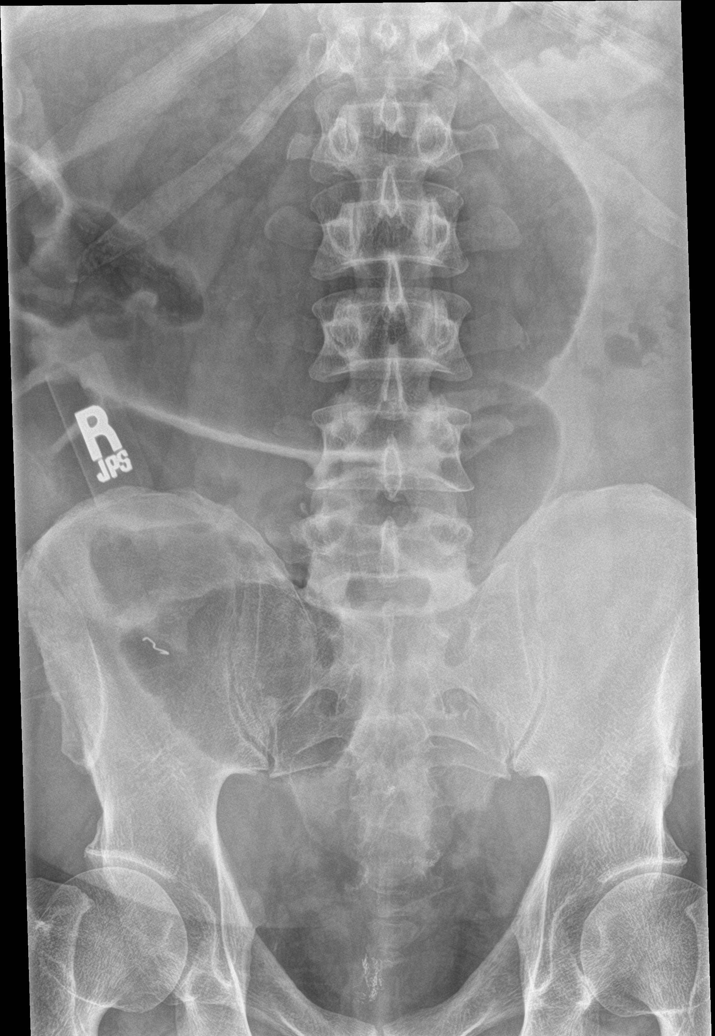

[l-spine obl (1 of 2)]
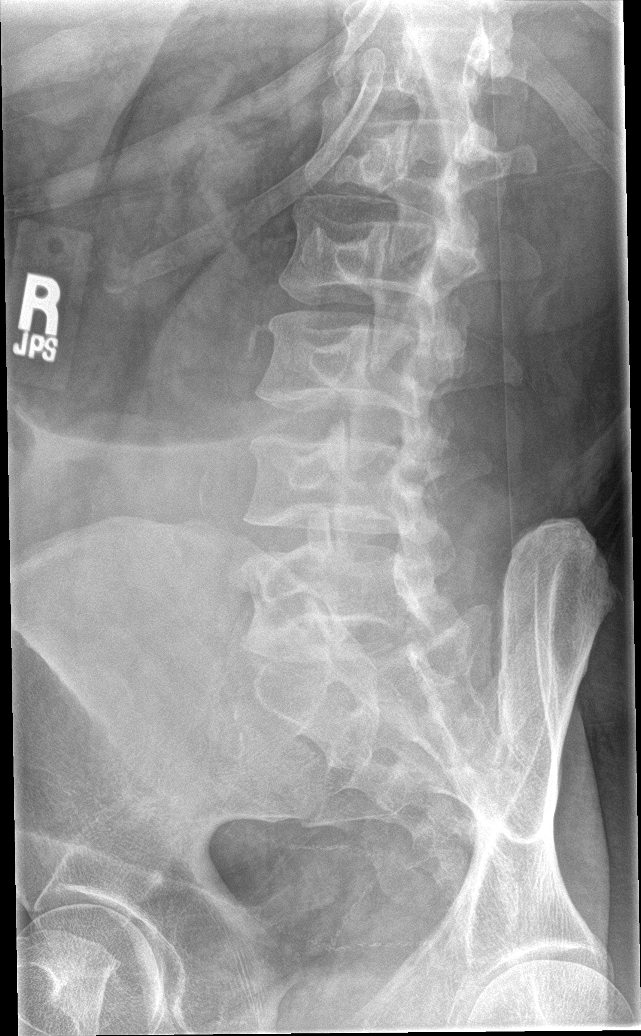

[l-spine obl (2 of 2)]
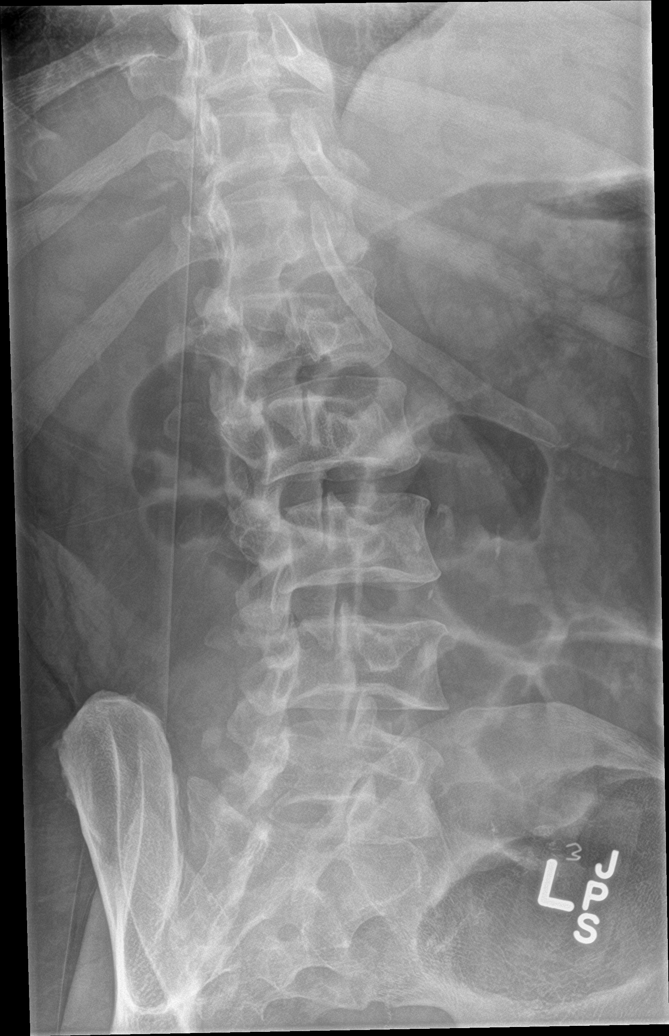

[l-spine lat]
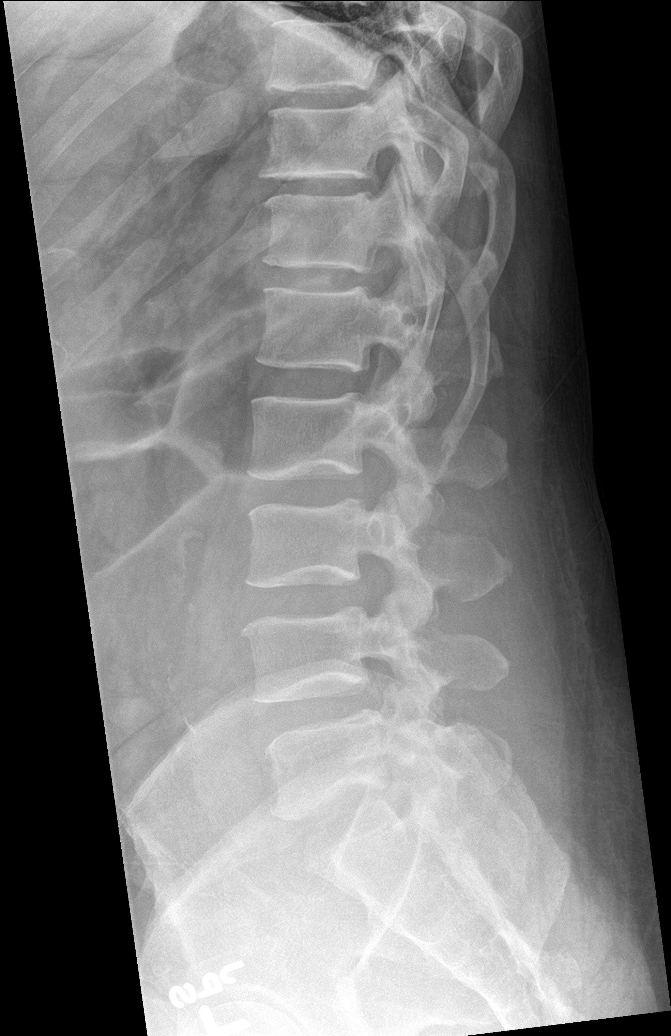

[l-spine spot]
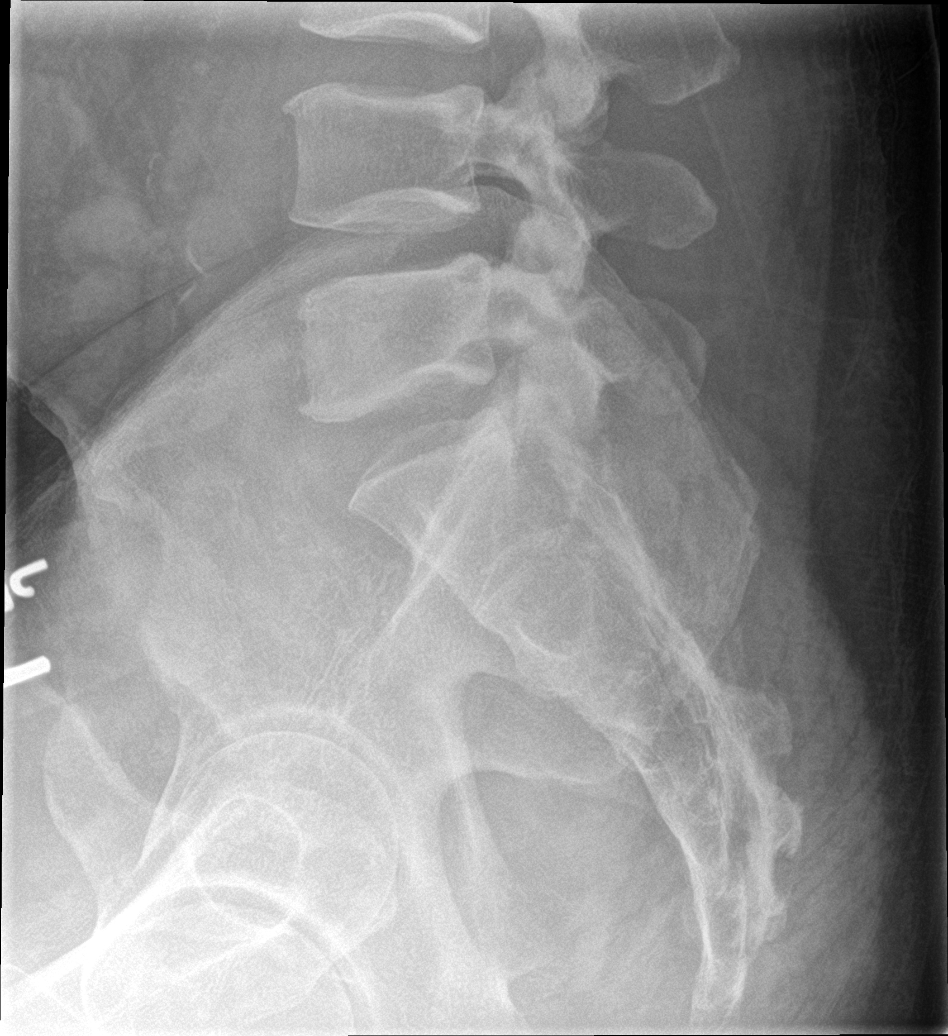

[5 of 5 positions shown; findings below may reference images not displayed]

FINDINGS: No acute bony abnormality identified. No evidence of fracture.
Diffuse mile degenerative change. Aortoiliac atherosclerotic
vascular calcification. Gastric distention noted. Abdominal series
can be obtained to further evaluate.
IMPRESSION: 1. Gastric distention.

2. Diffuse lumbar spine degenerative change. No acute abnormality.
Lumbar spine is stable from prior CT of 02/16/2016.

[DATE]. Aortoiliac atherosclerotic vascular calcification.

## 2019-02-11 ENCOUNTER — Other Ambulatory Visit: Payer: Self-pay | Admitting: Family Medicine

## 2019-02-11 DIAGNOSIS — I10 Essential (primary) hypertension: Secondary | ICD-10-CM

## 2019-05-15 ENCOUNTER — Other Ambulatory Visit: Payer: Self-pay

## 2019-05-15 ENCOUNTER — Institutional Professional Consult (permissible substitution): Payer: Self-pay | Admitting: Internal Medicine

## 2019-05-15 ENCOUNTER — Encounter (HOSPITAL_COMMUNITY): Payer: Self-pay | Admitting: Emergency Medicine

## 2019-05-15 ENCOUNTER — Emergency Department (HOSPITAL_COMMUNITY)
Admission: EM | Admit: 2019-05-15 | Discharge: 2019-05-15 | Disposition: A | Discharge status: Home / Self Care | Payer: Medicare HMO | Attending: Emergency Medicine | Admitting: Emergency Medicine

## 2019-05-15 DIAGNOSIS — J45909 Unspecified asthma, uncomplicated: Secondary | ICD-10-CM | POA: Diagnosis not present

## 2019-05-15 DIAGNOSIS — I10 Essential (primary) hypertension: Secondary | ICD-10-CM | POA: Diagnosis not present

## 2019-05-15 DIAGNOSIS — K529 Noninfective gastroenteritis and colitis, unspecified: Secondary | ICD-10-CM | POA: Diagnosis not present

## 2019-05-15 DIAGNOSIS — R112 Nausea with vomiting, unspecified: Secondary | ICD-10-CM | POA: Diagnosis present

## 2019-05-15 DIAGNOSIS — F17210 Nicotine dependence, cigarettes, uncomplicated: Secondary | ICD-10-CM | POA: Insufficient documentation

## 2019-05-15 DIAGNOSIS — Z79899 Other long term (current) drug therapy: Secondary | ICD-10-CM | POA: Diagnosis not present

## 2019-05-15 LAB — COMPREHENSIVE METABOLIC PANEL
ALT: 33 U/L (ref 0–44)
AST: 26 U/L (ref 15–41)
Albumin: 3.9 g/dL (ref 3.5–5.0)
Alkaline Phosphatase: 71 U/L (ref 38–126)
Anion gap: 10 (ref 5–15)
BUN: 11 mg/dL (ref 6–20)
CO2: 20 mmol/L — ABNORMAL LOW (ref 22–32)
Calcium: 9.1 mg/dL (ref 8.9–10.3)
Chloride: 106 mmol/L (ref 98–111)
Creatinine, Ser: 0.92 mg/dL (ref 0.61–1.24)
GFR calc Af Amer: >60 mL/min (ref >60)
GFR calc non Af Amer: >60 mL/min (ref >60)
Glucose, Bld: 113 mg/dL — ABNORMAL HIGH (ref 70–99)
Potassium: 3.2 mmol/L — ABNORMAL LOW (ref 3.5–5.1)
Sodium: 136 mmol/L (ref 135–145)
Total Bilirubin: 1.5 mg/dL — ABNORMAL HIGH (ref 0.3–1.2)
Total Protein: 7.1 g/dL (ref 6.5–8.1)

## 2019-05-15 LAB — URINALYSIS, ROUTINE W REFLEX MICROSCOPIC
Bilirubin Urine: NEGATIVE
Glucose, UA: NEGATIVE mg/dL
Hgb urine dipstick: NEGATIVE
Ketones, ur: NEGATIVE mg/dL
Leukocytes,Ua: NEGATIVE
Nitrite: NEGATIVE
Protein, ur: NEGATIVE mg/dL
Specific Gravity, Urine: 1.005 (ref 1.005–1.030)
pH: 6 (ref 5.0–8.0)

## 2019-05-15 LAB — CBC
HCT: 44.8 % (ref 39.0–52.0)
Hemoglobin: 14.5 g/dL (ref 13.0–17.0)
MCH: 28.3 pg (ref 26.0–34.0)
MCHC: 32.4 g/dL (ref 30.0–36.0)
MCV: 87.5 fL (ref 80.0–100.0)
Platelets: 242 10*3/uL (ref 150–400)
RBC: 5.12 MIL/uL (ref 4.22–5.81)
RDW: 13.2 % (ref 11.5–15.5)
WBC: 6.5 10*3/uL (ref 4.0–10.5)
nRBC: 0 % (ref 0.0–0.2)

## 2019-05-15 LAB — LIPASE, BLOOD: Lipase: 23 U/L (ref 11–51)

## 2019-05-15 MED ORDER — POTASSIUM CHLORIDE CRYS ER 20 MEQ PO TBCR: 20.0000 meq | EXTENDED_RELEASE_TABLET | Freq: Two times a day (BID) | ORAL | 0 refills | Status: DC

## 2019-05-15 MED ORDER — SODIUM CHLORIDE 0.9 % IV BOLUS
1000.0000 mL | Freq: Once | INTRAVENOUS | Status: AC
Start: 2019-05-15 — End: 2019-05-15
  Administered 2019-05-15: 1000 mL via INTRAVENOUS

## 2019-05-15 MED ORDER — SODIUM CHLORIDE 0.9% FLUSH: 3.0000 mL | Freq: Once | INTRAVENOUS | Status: DC

## 2019-05-15 MED ORDER — ONDANSETRON HCL 4 MG PO TABS: 4.0000 mg | ORAL_TABLET | Freq: Four times a day (QID) | ORAL | 0 refills | Status: DC

## 2019-05-15 MED ORDER — ONDANSETRON HCL 4 MG/2ML IJ SOLN
4.0000 mg | Freq: Once | INTRAMUSCULAR | Status: AC
  Administered 2019-05-15: 4 mg via INTRAVENOUS
  Filled 2019-05-15: qty 2

## 2019-05-15 NOTE — ED Provider Notes (Signed)
[facility] EMERGENCY DEPARTMENT Provider Note   CSN: [id number] Arrival date & time: 05/15/19  1248    History   Chief Complaint Chief Complaint  Patient presents with  . Emesis  . Nausea    HPI Benjamin Abbott is a 58 y.o. male.     HPI   58 year old male presents today with complaints of nausea vomiting.  Patient notes symptoms started 2 days ago.  He notes he has been unable to keep anything down.  He notes generalized abdominal pain is nonfocal.  He notes he has had increased diarrhea.  He reports a history of bowel resection in 1999 and always has some mild diarrhea but is increased.  He denies any fever or close sick contacts no abnormal food or drink.  He was able to keep his blood pressure medication down this morning.    Past Medical History:  Diagnosis Date  . Asthma   . Back pain   . Chronic pain   . Colon cancer ([place])   . Depression   . Hypertension   . Knee pain   . Neuropathy   . Obesity     Patient Active Problem List   Diagnosis Date Noted  . Lack of appetite 02/28/2018  . Chronic midline thoracic back pain 07/14/2017  . Tobacco dependence 07/14/2017  . Essential hypertension 07/14/2017  . Low back pain radiating to both legs 06/08/2017  . Asthma 06/08/2017  . Bilateral knee pain 06/08/2017  . Obesity 06/08/2017    Past Surgical History:  Procedure Laterality Date  . BOWEL RESECTION        Home Medications    Prior to Admission medications   Medication Sig Start Date End Date Taking? Authorizing Provider  acetaminophen (TYLENOL) 500 MG tablet Take 1 tablet (500 mg total) every 6 (six) hours as needed by mouth. 08/14/17   Diallo, Abdoulaye, MD  albuterol (PROVENTIL HFA;VENTOLIN HFA) 108 (90 Base) MCG/ACT inhaler Inhale 2 puffs into the lungs every 6 (six) hours as needed for wheezing or shortness of breath. 02/28/18   Diallo, Earna Coder, MD  amitriptyline (ELAVIL) 50 MG tablet Take 1 tablet (50 mg total) by mouth at bedtime.  02/28/18   Diallo, Earna Coder, MD  amLODipine (NORVASC) 10 MG tablet TAKE ONE TABLET BY MOUTH DAILY 02/11/19   Diallo, Earna Coder, MD  amoxicillin-clavulanate (AUGMENTIN) 875-125 MG tablet Take 1 tablet by mouth 2 (two) times daily. One po bid x 7 days 09/02/17   Drenda Freeze, MD  docusate sodium (COLACE) 100 MG capsule Take 1 capsule (100 mg total) by mouth every 12 (twelve) hours. 03/27/16   Charlesetta Shanks, MD  Fluticasone-Salmeterol (ADVAIR) 250-50 MCG/DOSE AEPB Inhale 1 puff into the lungs 2 (two) times daily. 02/28/18   Diallo, Earna Coder, MD  gabapentin (NEURONTIN) 300 MG capsule Take 1 capsule (300 mg total) by mouth 3 (three) times daily. 06/08/17   Recardo Evangelist, PA-C  lidocaine (LIDODERM) 5 % Place 1 patch onto the skin daily. Remove & Discard patch within 12 hours or as directed by MD 11/16/16   Randal Buba, April, MD  meloxicam (MOBIC) 15 MG tablet Take 1 tablet (15 mg total) by mouth daily. 06/08/17   Recardo Evangelist, PA-C  meloxicam (MOBIC) 15 MG tablet Take 1 tablet (15 mg total) daily by mouth. 08/14/17   Diallo, Abdoulaye, MD  ondansetron (ZOFRAN) 4 MG tablet Take 1 tablet (4 mg total) by mouth every 6 (six) hours. 05/15/19   Shaquana Buel, Dellis Filbert, PA-C  potassium chloride SA (  K-DUR) 20 MEQ tablet Take 1 tablet (20 mEq total) by mouth 2 (two) times daily. 05/15/19   Okey Regal, PA-C    Family History Family History  Problem Relation Age of Onset  . Heart attack Mother   . Heart attack Father     Social History Social History   Tobacco Use  . Smoking status: Current Every Day Smoker    Types: Cigarettes  . Smokeless tobacco: Never Used  . Tobacco comment: 2-3 cigs a day  Substance Use Topics  . Alcohol use: No  . Drug use: Yes    Types: Marijuana    Allergies   Patient has no known allergies.   Review of Systems Review of Systems  All other systems reviewed and are negative.  Physical Exam Updated Vital Signs BP (!) 129/104 (BP Location: Right Arm)   Pulse 64    Temp 98.4 F (36.9 C) (Oral)   Resp 18   SpO2 99%   Physical Exam Vitals signs and nursing note reviewed.  Constitutional:      Appearance: He is well-developed.  HENT:     Head: Normocephalic and atraumatic.  Eyes:     General: No scleral icterus.       Right eye: No discharge.        Left eye: No discharge.     Conjunctiva/sclera: Conjunctivae normal.     Pupils: Pupils are equal, round, and reactive to light.  Neck:     Musculoskeletal: Normal range of motion.     Vascular: No JVD.     Trachea: No tracheal deviation.  Pulmonary:     Effort: Pulmonary effort is normal.     Breath sounds: No stridor.  Abdominal:     General: There is no distension.     Palpations: Abdomen is soft. There is no mass.     Tenderness: There is no abdominal tenderness. There is no guarding.  Neurological:     Mental Status: He is alert and oriented to person, place, and time.     Coordination: Coordination normal.  Psychiatric:        Behavior: Behavior normal.        Thought Content: Thought content normal.        Judgment: Judgment normal.     ED Treatments / Results  Labs (all labs ordered are listed, but only abnormal results are displayed) Labs Reviewed  COMPREHENSIVE METABOLIC PANEL - Abnormal; Notable for the following components:      Result Value   Potassium 3.2 (*)    CO2 20 (*)    Glucose, Bld 113 (*)    Total Bilirubin 1.5 (*)    All other components within normal limits  URINALYSIS, ROUTINE W REFLEX MICROSCOPIC - Abnormal; Notable for the following components:   Color, Urine STRAW (*)    All other components within normal limits  LIPASE, BLOOD  CBC    EKG None  Radiology No results found.  Procedures Procedures (including critical care time)  Medications Ordered in ED Medications  sodium chloride flush (NS) 0.9 % injection 3 mL (0 mLs Intravenous Hold 05/15/19 1519)  sodium chloride 0.9 % bolus 1,000 mL (0 mLs Intravenous Stopped 05/15/19 1827)  ondansetron  (ZOFRAN) injection 4 mg (4 mg Intravenous Given 05/15/19 1529)     Initial Impression / Assessment and Plan / ED Course  I have reviewed the triage vital signs and the nursing notes.  Pertinent labs & imaging results that were available during my care of  the patient were reviewed by me and considered in my medical decision making (see chart for details).          Assessment/Plan: 58 year old male presents today with likely viral gastroenteritis.  He is well-appearing in no acute distress.  He has a soft nondistended abdomen.  He is passing stool and gas low suspicion for bowel obstruction.  No signs of appendicitis or diverticulitis.  Patient was given Zofran and fluids which significant improved his symptoms.  He is tolerating both food and liquid here.  He will be discharged home with Zofran and potassium.  He will return immediately if develops any new or worsening signs or symptoms.  He verbalized understanding and agreement to today's plan had no further questions or concerns at time of discharge.   Final Clinical Impressions(s) / ED Diagnoses   Final diagnoses:  Gastroenteritis    ED Discharge Orders         Ordered    ondansetron (ZOFRAN) 4 MG tablet  Every 6 hours     05/15/19 1814    potassium chloride SA (K-DUR) 20 MEQ tablet  2 times daily     05/15/19 1814           Okey Regal, PA-C 05/15/19 1840    Blanchie Dessert, MD 05/16/19 1736

## 2019-05-15 NOTE — ED Triage Notes (Addendum)
Pt arrives to ED from his PCP with complaints of nausea since Monday and emesis since Tuesday. Patient states he is not able to hold anything down and had generalized abdominal pain starting today. Pt has hx of colon cancer with surgery in 1999.

## 2019-05-15 NOTE — Discharge Instructions (Addendum)
Please read attached information. If you experience any new or worsening signs or symptoms please return to the emergency room for evaluation. Please follow-up with your primary care provider or specialist as discussed. Please use medication prescribed only as directed and discontinue taking if you have any concerning signs or symptoms.   °

## 2019-05-22 ENCOUNTER — Ambulatory Visit (INDEPENDENT_AMBULATORY_CARE_PROVIDER_SITE_OTHER): Payer: Medicare HMO | Admitting: Pulmonary Disease

## 2019-05-22 ENCOUNTER — Encounter: Payer: Self-pay | Admitting: Pulmonary Disease

## 2019-05-22 ENCOUNTER — Other Ambulatory Visit: Payer: Self-pay

## 2019-05-22 DIAGNOSIS — Z23 Encounter for immunization: Secondary | ICD-10-CM | POA: Diagnosis not present

## 2019-05-22 DIAGNOSIS — G4710 Hypersomnia, unspecified: Secondary | ICD-10-CM

## 2019-05-22 NOTE — Assessment & Plan Note (Signed)
Given excessive daytime somnolence, narrow pharyngeal exam, witnessed apneas & loud snoring, obstructive sleep apnea is very likely & an overnight polysomnogram will be scheduled as a split study. The pathophysiology of obstructive sleep apnea , it's cardiovascular consequences & modes of treatment including CPAP were discused with the patient in detail & they evidenced understanding.  He also seems to have some sleep onset insomnia -which may be related to a sedentary lifestyle and frequent daytime naps rather than primary insomnia Rules of sleep hygiene were emphasized

## 2019-05-22 NOTE — Progress Notes (Signed)
Subjective:    Patient ID: Benjamin Abbott, male    DOB: 28-Jun-1961, 58 y.o.   MRN: [id number]  HPI  Chief Complaint  Patient presents with  . sleep consult    trouble sleeping daytime sleepiness    58 year old smoker, on disability, presents for evaluation of excessive daytime somnolence and loud snoring. Epworth sleepiness score is 14 and he reports sleepiness while sitting and reading, lying down to rest in the afternoons and as a passenger in a car.  At the same time he also reports some sleep onset insomnia.  His bedtime can be as late as 3 AM, if he goes to bed sooner than it takes him a few hours to fall asleep.  TV stays on through the night, he sleeps on his side with 2 pillows, reports frequent nocturnal awakenings 2-3 during the night and is finally out of bed around noontime with dryness of mouth but denies headaches.  Girlfriend has noted loud snoring but not witnessed apneas There is no history suggestive of cataplexy, sleep paralysis or parasomnias His weight has been more than 5 pounds over the last few years.  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  He reports asthma since childhood and over the last several years has been maintained on a regimen of Advair and albuterol as needed.  He was hospitalized in 2017 which was his last severe asthma attack.  He smokes 1/2 pack/day.  He admits to using Advair only when his symptoms are severe.  More frequently he uses albuterol for wheezing or shortness of breath   Past Medical History:  Diagnosis Date  . Asthma   . Back pain   . Chronic pain   . Colon cancer ([place])   . Depression   . Hypertension   . Knee pain   . Neuropathy   . Obesity     Past Surgical History:  Procedure Laterality Date  . BOWEL RESECTION      No Known Allergies  Social  History   Socioeconomic History  . Marital status: Single    Spouse name: Not on file  . Number of children: Not on file  . Years of education: Not on file  . Highest education level: Not on file  Occupational History  . Not on file  Social Needs  . Financial resource strain: Not on file  . Food insecurity    Worry: Not on file    Inability: Not on file  . Transportation needs    Medical: Not on file    Non-medical: Not on file  Tobacco Use  . Smoking status: Current Every Day Smoker    Packs/day: 0.50    Years: 35.00    Pack years: 17.50    Types: Cigarettes    Start date: 69  . Smokeless tobacco: Never Used  . Tobacco comment: 05/22/19--2-3 cigs a day  Substance and Sexual Activity  . Alcohol use: No  . Drug use: Yes    Types: Marijuana  . Sexual activity: Yes  Lifestyle  . Physical activity    Days per week: Not on file    Minutes per session: Not on file  . Stress: Not on file  Relationships  . Social Herbalist on phone: Not on file    Gets together:  Not on file    Attends religious service: Not on file    Active member of club or organization: Not on file    Attends meetings of clubs or organizations: Not on file    Relationship status: Not on file  . Intimate partner violence    Fear of current or ex partner: Not on file    Emotionally abused: Not on file    Physically abused: Not on file    Forced sexual activity: Not on file  Other Topics Concern  . Not on file  Social History Narrative  . Not on file     Family History  Problem Relation Age of Onset  . Heart attack Mother   . Heart attack Father       Review of Systems  Constitutional: Negative for fever and unexpected weight change.  HENT: Positive for sneezing. Negative for congestion, dental problem, ear pain, nosebleeds, postnasal drip, rhinorrhea, sinus pressure, sore throat and trouble swallowing.   Eyes: Negative for redness and itching.  Respiratory: Positive for cough,  shortness of breath and wheezing. Negative for chest tightness.   Cardiovascular: Positive for leg swelling. Negative for palpitations.  Gastrointestinal: Negative for nausea and vomiting.  Genitourinary: Negative for dysuria.  Musculoskeletal: Positive for joint swelling.  Skin: Negative for rash.  Allergic/Immunologic: Negative.  Negative for environmental allergies, food allergies and immunocompromised state.  Neurological: Negative for headaches.  Hematological: Does not bruise/bleed easily.  Psychiatric/Behavioral: Negative for dysphoric mood. The patient is nervous/anxious.        Objective:   Physical Exam  Gen. Pleasant,  in no distress, normal affect ENT - no pallor,icterus, no post nasal drip, class 2 airway, large tongue Neck: No JVD, no thyromegaly, no carotid bruits Lungs: no use of accessory muscles, no dullness to percussion, decreased without rales or rhonchi  Cardiovascular: Rhythm regular, heart sounds  normal, no murmurs or gallops, no peripheral edema Abdomen: soft and non-tender, no hepatosplenomegaly, BS normal. Musculoskeletal: No deformities, no cyanosis or clubbing Neuro:  alert, non focal, no tremors       Assessment & Plan:

## 2019-05-22 NOTE — Patient Instructions (Signed)
Schedule split study Try to quit smoking Stay on Advair

## 2019-05-22 NOTE — Assessment & Plan Note (Signed)
Continue Advair-emphasized daily usage and compliance Albuterol only as needed Smoking cessation was also emphasized

## 2019-05-24 ENCOUNTER — Telehealth: Payer: Self-pay | Admitting: Pulmonary Disease

## 2019-05-24 DIAGNOSIS — G4710 Hypersomnia, unspecified: Secondary | ICD-10-CM

## 2019-05-24 NOTE — Telephone Encounter (Signed)
Does dr Elsworth Soho want to change this appt Benjamin Abbott

## 2019-05-24 NOTE — Telephone Encounter (Signed)
Benjamin Abbott states patient does not qualify for a overnight sleep study.He qualifies for a home sleep study.Please advise, thanks

## 2019-05-27 NOTE — Telephone Encounter (Signed)
Encounter pending Dr. Bari Mantis review.

## 2019-05-29 ENCOUNTER — Other Ambulatory Visit (HOSPITAL_COMMUNITY)
Admission: RE | Admit: 2019-05-29 | Discharge: 2019-05-29 | Disposition: A | Discharge status: Home / Self Care | Payer: Medicare HMO | Source: Ambulatory Visit | Attending: Pulmonary Disease | Admitting: Pulmonary Disease

## 2019-05-29 DIAGNOSIS — Z01812 Encounter for preprocedural laboratory examination: Secondary | ICD-10-CM | POA: Diagnosis present

## 2019-05-29 DIAGNOSIS — Z20828 Contact with and (suspected) exposure to other viral communicable diseases: Secondary | ICD-10-CM | POA: Diagnosis not present

## 2019-05-29 LAB — SARS CORONAVIRUS 2 (TAT 6-24 HRS): SARS Coronavirus 2: NEGATIVE

## 2019-05-29 NOTE — Telephone Encounter (Signed)
Printed & gave to [facility] to [employer].

## 2019-05-29 NOTE — Telephone Encounter (Signed)
PCCs if you will please arrange home sleep study.  Thank you!

## 2019-05-29 NOTE — Telephone Encounter (Signed)
Order will need to be placed for home sleep study.

## 2019-05-29 NOTE — Telephone Encounter (Signed)
Okay for home sleep study

## 2019-05-29 NOTE — Telephone Encounter (Signed)
Order is placed. Thank you.

## 2019-05-31 ENCOUNTER — Other Ambulatory Visit: Payer: Self-pay

## 2019-05-31 ENCOUNTER — Ambulatory Visit (HOSPITAL_BASED_OUTPATIENT_CLINIC_OR_DEPARTMENT_OTHER): Payer: Medicare HMO | Attending: Pulmonary Disease | Admitting: Pulmonary Disease

## 2019-05-31 VITALS — Ht 66.0 in | Wt 202.0 lb

## 2019-05-31 DIAGNOSIS — G4733 Obstructive sleep apnea (adult) (pediatric): Secondary | ICD-10-CM | POA: Diagnosis not present

## 2019-05-31 DIAGNOSIS — Z79899 Other long term (current) drug therapy: Secondary | ICD-10-CM | POA: Insufficient documentation

## 2019-05-31 DIAGNOSIS — G4710 Hypersomnia, unspecified: Secondary | ICD-10-CM | POA: Insufficient documentation

## 2019-05-31 DIAGNOSIS — I493 Ventricular premature depolarization: Secondary | ICD-10-CM | POA: Diagnosis not present

## 2019-06-04 ENCOUNTER — Other Ambulatory Visit: Payer: Self-pay

## 2019-06-04 ENCOUNTER — Other Ambulatory Visit (HOSPITAL_BASED_OUTPATIENT_CLINIC_OR_DEPARTMENT_OTHER): Payer: Self-pay

## 2019-06-04 DIAGNOSIS — G4710 Hypersomnia, unspecified: Secondary | ICD-10-CM

## 2019-06-11 ENCOUNTER — Telehealth: Payer: Self-pay | Admitting: Pulmonary Disease

## 2019-06-11 DIAGNOSIS — G4733 Obstructive sleep apnea (adult) (pediatric): Secondary | ICD-10-CM

## 2019-06-11 NOTE — Procedures (Signed)
Patient Name: Benjamin Abbott, Benjamin Abbott Date: 05/31/2019 Gender: Male D.O.B: 09-18-1961 Age (years): 89 Referring Provider: Kara Mead MD, ABSM Height (inches): 69 Interpreting Physician: Kara Mead MD, ABSM Weight (lbs): 202 RPSGT: Baxter Flattery BMI: 30 MRN: [id number] Neck Size: 16.50 CLINICAL INFORMATION Sleep Study Type: Split Night CPAP  Indication for sleep study: OSA, Snoring, Witnessed Apneas  Epworth Sleepiness Score: 9  SLEEP STUDY TECHNIQUE As per the AASM Manual for the Scoring of Sleep and Associated Events v2.3 (April 2016) with a hypopnea requiring 4% desaturations.  The channels recorded and monitored were frontal, central and occipital EEG, electrooculogram (EOG), submentalis EMG (chin), nasal and oral airflow, thoracic and abdominal wall motion, anterior tibialis EMG, snore microphone, electrocardiogram, and pulse oximetry. Continuous positive airway pressure (CPAP) was initiated when the patient met split night criteria and was titrated according to treat sleep-disordered breathing.  MEDICATIONS Medications self-administered by patient taken the night of the study : AMLODIPINE, CELECOXIB, OMEPRAZOLE  RESPIRATORY PARAMETERS Diagnostic  Total AHI (/hr): 24.9 RDI (/hr): 24.9 OA Index (/hr): 14.2 CA Index (/hr): 0.0 REM AHI (/hr): 0.0 NREM AHI (/hr): 31.8 Supine AHI (/hr): 72.5 Non-supine AHI (/hr): 18.5 Min O2 Sat (%): 79.0 Mean O2 (%): 94.4 Time below 88% (min): 4.3   Titration  Optimal Pressure (cm): 13 AHI at Optimal Pressure (/hr): 0.0 Min O2 at Optimal Pressure (%): 94.0 Supine % at Optimal (%): 100 Sleep % at Optimal (%): 100   SLEEP ARCHITECTURE The recording time for the entire night was 433.7 minutes.  During a baseline period of 239.5 minutes, the patient slept for 202.7 minutes in REM and nonREM, yielding a sleep efficiency of 84.6%%. Sleep onset after lights out was 4.3 minutes with a REM latency of 138.5 minutes. The patient spent 2.2%% of the night  in stage N1 sleep, 76.1%% in stage N2 sleep, 0.0%% in stage N3 and 21.7% in REM.  During the titration period of 188.6 minutes, the patient slept for 142.9 minutes in REM and nonREM, yielding a sleep efficiency of 75.8%%. Sleep onset after CPAP initiation was 42.7 minutes with a REM latency of 70.0 minutes. The patient spent 0.7%% of the night in stage N1 sleep, 68.5%% in stage N2 sleep, 0.0%% in stage N3 and 30.8% in REM.  CARDIAC DATA The 2 lead EKG demonstrated sinus rhythm. The mean heart rate was 100.0 beats per minute. Other EKG findings include: PVCs.   LEG MOVEMENT DATA The total Periodic Limb Movements of Sleep (PLMS) were 0. The PLMS index was 0.0 .  IMPRESSIONS Moderate obstructive sleep apnea occurred during the diagnostic portion of the study(AHI = 24.9/hour). An optimal PAP pressure was selected for this patient ( 13 cm of water) No significant central sleep apnea occurred during the diagnostic portion of the study (CAI = 0.0/hour). The patient had minimal or no oxygen desaturation during the diagnostic portion of the study (Min O2 = 79.0%) No snoring was audible during the diagnostic portion of the study. EKG findings include PVCs. Clinically significant periodic limb movements did not occur during sleep.   DIAGNOSIS Obstructive Sleep Apnea (327.23 [G47.33 ICD-10])   RECOMMENDATIONS Trial of CPAP therapy on 13 cm H2O with a Large size Fisher&Paykel Full Face Mask Simplus mask and heated humidification. Avoid alcohol, sedatives and other CNS depressants that may worsen sleep apnea and disrupt normal sleep architecture. Sleep hygiene should be reviewed to assess factors that may improve sleep quality. Weight management and regular exercise should be initiated or continued. Return to [facility] Center for  re-evaluation after 4 weeks of therapy   Kara Mead MD Board Certified in [place]

## 2019-06-11 NOTE — Telephone Encounter (Signed)
LMTCB

## 2019-06-11 NOTE — Telephone Encounter (Signed)
Study showed moderate OSA -AHI 24/h Send Rx to DME for - CPAP  13 cm H2O with a Large size Fisher&Paykel Full Face Mask Simplus mask and heated humidification  OV with APP in 6 wks

## 2019-06-12 NOTE — Telephone Encounter (Signed)
Pt returning call to get test results and can be reached @ [phone].Benjamin Abbott

## 2019-06-12 NOTE — Telephone Encounter (Signed)
Spoke with pt and notified of results/recs per Dr Elsworth Soho  He verbalized understanding  Appt scheduled and order sent to [facility]

## 2019-07-15 ENCOUNTER — Other Ambulatory Visit: Payer: Self-pay

## 2019-07-15 ENCOUNTER — Ambulatory Visit (INDEPENDENT_AMBULATORY_CARE_PROVIDER_SITE_OTHER): Payer: Medicare HMO | Admitting: Primary Care

## 2019-07-15 ENCOUNTER — Encounter: Payer: Self-pay | Admitting: Primary Care

## 2019-07-15 VITALS — BP 110/60 | HR 74 | Ht 69.0 in | Wt 199.4 lb

## 2019-07-15 DIAGNOSIS — G4733 Obstructive sleep apnea (adult) (pediatric): Secondary | ICD-10-CM

## 2019-07-15 DIAGNOSIS — Z9989 Dependence on other enabling machines and devices: Secondary | ICD-10-CM | POA: Diagnosis not present

## 2019-07-15 NOTE — Assessment & Plan Note (Signed)
-   Stable on current therapy - Continue Advair 250 two puffs twice daily; prn albuterol

## 2019-07-15 NOTE — Patient Instructions (Addendum)
Recommendations: Continue to wear CPAP every night for 4-6 hours or more  Do not drive if experiencing excessive daytime fatigue  Smoking cessation: Continue to work hard on quitting smoking Taper amount and pick a quit date Use nicorette gum to supplement   Referral [facility]: Change pressure to 10-15cm H20 auto titrate   Follow-up: 4 weeks with Eustaquio Maize NP (bring SD card in CPAP to next appointment)   CPAP and BPAP Information CPAP and BPAP are methods of helping a person breathe with the use of air pressure. CPAP stands for "continuous positive airway pressure." BPAP stands for "bi-level positive airway pressure." In both methods, air is blown through your nose or mouth and into your air passages to help you breathe well. CPAP and BPAP use different amounts of pressure to blow air. With CPAP, the amount of pressure stays the same while you breathe in and out. With BPAP, the amount of pressure is increased when you breathe in (inhale) so that you can take larger breaths. Your health care provider will recommend whether CPAP or BPAP would be more helpful for you. Why are CPAP and BPAP treatments used? CPAP or BPAP can be helpful if you have:  Sleep apnea.  Chronic obstructive pulmonary disease (COPD).  Heart failure.  Medical conditions that weaken the muscles of the chest including muscular dystrophy, or neurological diseases such as amyotrophic lateral sclerosis (ALS).  Other problems that cause breathing to be weak, abnormal, or difficult. CPAP is most commonly used for obstructive sleep apnea (OSA) to keep the airways from collapsing when the muscles relax during sleep. How is CPAP or BPAP administered? Both CPAP and BPAP are provided by a small machine with a flexible plastic tube that attaches to a plastic mask. You wear the mask. Air is blown through the mask into your nose or mouth. The amount of pressure that is used to blow the air can be adjusted on the machine. Your health  care provider will determine the pressure setting that should be used based on your individual needs. When should CPAP or BPAP be used? In most cases, the mask only needs to be worn during sleep. Generally, the mask needs to be worn throughout the night and during any daytime naps. People with certain medical conditions may also need to wear the mask at other times when they are awake. Follow instructions from your health care provider about when to use the machine. What are some tips for using the mask?   Because the mask needs to be snug, some people feel trapped or closed-in (claustrophobic) when first using the mask. If you feel this way, you may need to get used to the mask. One way to do this is by holding the mask loosely over your nose or mouth and then gradually applying the mask more snugly. You can also gradually increase the amount of time that you use the mask.  Masks are available in various types and sizes. Some fit over your mouth and nose while others fit over just your nose. If your mask does not fit well, talk with your health care provider about getting a different one.  If you are using a mask that fits over your nose and you tend to breathe through your mouth, a chin strap may be applied to help keep your mouth closed.  The CPAP and BPAP machines have alarms that may sound if the mask comes off or develops a leak.  If you have trouble with the mask,  it is very important that you talk with your health care provider about finding a way to make the mask easier to tolerate. Do not stop using the mask. Stopping the use of the mask could have a negative impact on your health. What are some tips for using the machine?  Place your CPAP or BPAP machine on a secure table or stand near an electrical outlet.  Know where the on/off switch is located on the machine.  Follow instructions from your health care provider about how to set the pressure on your machine and when you should use  it.  Do not eat or drink while the CPAP or BPAP machine is on. Food or fluids could get pushed into your lungs by the pressure of the CPAP or BPAP.  Do not smoke. Tobacco smoke residue can damage the machine.  For home use, CPAP and BPAP machines can be rented or purchased through home health care companies. Many different brands of machines are available. Renting a machine before purchasing may help you find out which particular machine works well for you.  Keep the CPAP or BPAP machine and attachments clean. Ask your health care provider for specific instructions. Get help right away if:  You have redness or open areas around your nose or mouth where the mask fits.  You have trouble using the CPAP or BPAP machine.  You cannot tolerate wearing the CPAP or BPAP mask.  You have pain, discomfort, and bloating in your abdomen. Summary  CPAP and BPAP are methods of helping a person breathe with the use of air pressure.  Both CPAP and BPAP are provided by a small machine with a flexible plastic tube that attaches to a plastic mask.  If you have trouble with the mask, it is very important that you talk with your health care provider about finding a way to make the mask easier to tolerate. This information is not intended to replace advice given to you by your health care provider. Make sure you discuss any questions you have with your health care provider. Document Released: 06/10/2004 Document Revised: 01/02/2019 Document Reviewed: 08/01/2016 Elsevier Patient Education  2020 [employer].

## 2019-07-15 NOTE — Assessment & Plan Note (Signed)
-   Appears compliant with use but having difficulty tolerating mask/pressure setting - Plan is to change pressure to auto-titrate 10-15cm H20  - Patient meeting with DME company today to change mask (if not helpful, recommend mask desensitization study) - Patient to bring in [place] card to next appointment to access sleep data - FU in 4 weeks with NP

## 2019-07-15 NOTE — Progress Notes (Signed)
@Patient  ID: Benjamin Abbott, male    DOB: 26-Apr-1961, 58 y.o.   MRN: [id number]  Chief Complaint  Patient presents with  . Follow-up    C/o gets tangled up with CPAP,avg. wearing 3 hrs.,face mask digs in under eyes,pr. feels light    Referring provider: Bonnita Hollow, MD  HPI: 58 year old male, 1/2 pack current every day smoker. PMH significant for asthma, OSA, hypersomnolence, obesity, tobacco abuse. Patient of Dr. Elsworth Soho, seen for initial consult on 05/22/19. Epworth 14. Sleep test showed moderate obstructive sleep apnea with AHI 24/hr. RX DME for CPAP 13cm H20 with large size fisher&Paykel full face mask. Maintained on Advair 250.   07/15/2019 Patient presents today for 1 month follow-up. He is doing ok, having some issues with mask fit and pressure. He was unable to bring his SD card to today' s appointment. States that he only has been wearing his CPAP for about 3 hours each night. He feels pressure is not enough for him and that his mask is bothering his under eyes. Despite this he does report benefit from wearing CPAP. Reports less snoring and more energy during the day. He does complain of dry mouth, he is unsure how to adjust humidification on CPAP unit. He has an apt with his DME company today to try a different mask. He continues using Advair twice daily as prescribed, has not needed his rescue inhaler much. Experiences occasional wheezing. He has down down on smoking, states that he doesn't buy packs just singles. No vaping. Received influenza vaccine in late August.   No Known Allergies  Immunization History  Administered Date(s) Administered  . Influenza,inj,Quad PF,6+ Mos 06/30/2017, 05/22/2019    Past Medical History:  Diagnosis Date  . Asthma   . Back pain   . Chronic pain   . Colon cancer ([place])   . Depression   . Hypertension   . Knee pain   . Neuropathy   . Obesity     Tobacco History: Social History   Tobacco Use  Smoking Status Current Every Day Smoker   . Packs/day: 0.50  . Years: 35.00  . Pack years: 17.50  . Types: Cigarettes  . Start date: 1985  Smokeless Tobacco Never Used  Tobacco Comment   05/22/19--2-3 cigs a day   Ready to quit: Not Answered Counseling given: Not Answered Comment: 05/22/19--2-3 cigs a day   Outpatient Medications Prior to Visit  Medication Sig Dispense Refill  . albuterol (PROVENTIL HFA;VENTOLIN HFA) 108 (90 Base) MCG/ACT inhaler Inhale 2 puffs into the lungs every 6 (six) hours as needed for wheezing or shortness of breath. 18 g 1  . amitriptyline (ELAVIL) 50 MG tablet Take 1 tablet (50 mg total) by mouth at bedtime. 30 tablet 0  . amLODipine (NORVASC) 10 MG tablet TAKE ONE TABLET BY MOUTH DAILY 30 tablet 10  . Fluticasone-Salmeterol (ADVAIR) 250-50 MCG/DOSE AEPB Inhale 1 puff into the lungs 2 (two) times daily. 14 each 1  . meloxicam (MOBIC) 15 MG tablet Take 1 tablet (15 mg total) by mouth daily. 30 tablet 0  . potassium chloride SA (K-DUR) 20 MEQ tablet Take 1 tablet (20 mEq total) by mouth 2 (two) times daily. 14 tablet 0  . acetaminophen (TYLENOL) 500 MG tablet Take 1 tablet (500 mg total) every 6 (six) hours as needed by mouth. (Patient not taking: Reported on 05/22/2019) 30 tablet 0  . gabapentin (NEURONTIN) 300 MG capsule Take 1 capsule (300 mg total) by mouth 3 (three) times  daily. (Patient not taking: Reported on 05/22/2019) 90 capsule 0  . lidocaine (LIDODERM) 5 % Place 1 patch onto the skin daily. Remove & Discard patch within 12 hours or as directed by MD (Patient not taking: Reported on 05/22/2019) 10 patch 0  . amoxicillin-clavulanate (AUGMENTIN) 875-125 MG tablet Take 1 tablet by mouth 2 (two) times daily. One po bid x 7 days (Patient not taking: Reported on 07/15/2019) 14 tablet 0  . docusate sodium (COLACE) 100 MG capsule Take 1 capsule (100 mg total) by mouth every 12 (twelve) hours. (Patient not taking: Reported on 07/15/2019) 60 capsule 0  . meloxicam (MOBIC) 15 MG tablet Take 1 tablet (15 mg  total) daily by mouth. (Patient not taking: Reported on 05/22/2019) 15 tablet 0  . ondansetron (ZOFRAN) 4 MG tablet Take 1 tablet (4 mg total) by mouth every 6 (six) hours. (Patient not taking: Reported on 07/15/2019) 12 tablet 0   No facility-administered medications prior to visit.    Review of Systems  Review of Systems  Constitutional: Negative.  Negative for fatigue.  HENT: Negative.   Respiratory: Positive for wheezing. Negative for cough and shortness of breath.   Cardiovascular: Negative.   Psychiatric/Behavioral: Negative.    Physical Exam  BP 110/60 (BP Location: Left Arm, Cuff Size: Normal)   Pulse 74   Ht 5\' 9"  (1.753 m)   Wt 199 lb 6.4 oz (90.4 kg)   SpO2 99%   BMI 29.45 kg/m  Physical Exam Constitutional:      Appearance: Normal appearance. He is not ill-appearing.  HENT:     Head: Normocephalic and atraumatic.     Mouth/Throat:     Mouth: Mucous membranes are moist.     Pharynx: Oropharynx is clear.     Comments: Mallampati class I-II Cardiovascular:     Rate and Rhythm: Normal rate and regular rhythm.  Pulmonary:     Effort: Pulmonary effort is normal. No respiratory distress.     Breath sounds: Normal breath sounds. No wheezing or rhonchi.  Musculoskeletal: Normal range of motion.  Skin:    General: Skin is warm and dry.  Neurological:     General: No focal deficit present.     Mental Status: He is alert and oriented to person, place, and time. Mental status is at baseline.  Psychiatric:        Mood and Affect: Mood normal.        Behavior: Behavior normal.        Thought Content: Thought content normal.        Judgment: Judgment normal.      Lab Results:  CBC    Component Value Date/Time   WBC 6.5 05/15/2019 1345   RBC 5.12 05/15/2019 1345   HGB 14.5 05/15/2019 1345   HGB 14.0 02/28/2018 1538   HCT 44.8 05/15/2019 1345   HCT 42.6 02/28/2018 1538   PLT 242 05/15/2019 1345   PLT 266 02/28/2018 1538   MCV 87.5 05/15/2019 1345   MCV 86  02/28/2018 1538   MCH 28.3 05/15/2019 1345   MCHC 32.4 05/15/2019 1345   RDW 13.2 05/15/2019 1345   RDW 14.9 02/28/2018 1538   LYMPHSABS 2.2 02/28/2018 1538   MONOABS 0.6 09/20/2016 0856   EOSABS 0.2 02/28/2018 1538   BASOSABS 0.0 02/28/2018 1538    BMET    Component Value Date/Time   NA 136 05/15/2019 1345   NA 141 02/28/2018 1538   K 3.2 (L) 05/15/2019 1345   CL 106 05/15/2019  1345   CO2 20 (L) 05/15/2019 1345   GLUCOSE 113 (H) 05/15/2019 1345   BUN 11 05/15/2019 1345   BUN 7 02/28/2018 1538   CREATININE 0.92 05/15/2019 1345   CALCIUM 9.1 05/15/2019 1345   GFRNONAA >60 05/15/2019 1345   GFRAA >60 05/15/2019 1345    BNP No results found for: BNP  ProBNP No results found for: PROBNP  Imaging: No results found.   Assessment & Plan:   OSA on CPAP - Appears compliant with use but having difficulty tolerating mask/pressure setting - Plan is to change pressure to auto-titrate 10-15cm H20  - Patient meeting with DME company today to change mask (if not helpful, recommend mask desensitization study) - Patient to bring in [place] card to next appointment to access sleep data - FU in 4 weeks with NP    Asthma - Stable on current therapy - Continue Advair 250 two puffs twice daily; prn albuterol    Martyn Ehrich, NP 07/15/2019

## 2019-07-16 ENCOUNTER — Telehealth: Payer: Self-pay | Admitting: Primary Care

## 2019-07-16 NOTE — Telephone Encounter (Signed)
Spoke with pt, states that his cpap doesn't have a chip in his machine.  I advised that he is uploaded to [employer], so we wouldn't need a chip to get his download.  Pt expressed understanding.  Nothing further needed at this time- will close encounter.

## 2019-07-26 ENCOUNTER — Ambulatory Visit: Payer: Medicare HMO | Admitting: Primary Care

## 2019-08-12 ENCOUNTER — Ambulatory Visit (INDEPENDENT_AMBULATORY_CARE_PROVIDER_SITE_OTHER): Payer: Medicare HMO | Admitting: Primary Care

## 2019-08-12 ENCOUNTER — Other Ambulatory Visit: Payer: Self-pay

## 2019-08-12 ENCOUNTER — Encounter: Payer: Self-pay | Admitting: Primary Care

## 2019-08-12 DIAGNOSIS — Z9989 Dependence on other enabling machines and devices: Secondary | ICD-10-CM | POA: Diagnosis not present

## 2019-08-12 DIAGNOSIS — F17200 Nicotine dependence, unspecified, uncomplicated: Secondary | ICD-10-CM | POA: Diagnosis not present

## 2019-08-12 DIAGNOSIS — G4733 Obstructive sleep apnea (adult) (pediatric): Secondary | ICD-10-CM

## 2019-08-12 NOTE — Patient Instructions (Addendum)
Pleasure seeing you again today CONGRATS on quitting smoking, I am so proud of you!!!!!!!!  Sleep apnea: Continue to wear CPAP mask every night for 4-6 hours or more (set an alarm on your phone to remind you of bedtime) Do not drive if experiencing excessive datime fatigue or somnolence  Follow-up: 6 months with Dr. Elsworth Soho or [facility]

## 2019-08-12 NOTE — Assessment & Plan Note (Signed)
-   Compliant with CPAP and reports benefit from use - Pressure 10-15cm H20 (12.9cm h20-95%); AHI 2.8 - Advised patient not to drive if experiencing excessive daytime fatigue or somnolence - Follow up in 6 months than annually

## 2019-08-12 NOTE — Progress Notes (Addendum)
@Patient  ID: Benjamin Abbott, male    DOB: 01/22/1961, 58 y.o.   MRN: [id number]  Chief Complaint  Patient presents with  . OSA on CPAP    Referring provider: Bonnita Hollow, MD  HPI: 58 year old male, 1/2 pack current every day smoker. PMH significant for asthma, OSA, hypersomnolence, obesity, tobacco abuse. Patient of Dr. Elsworth Soho, seen for initial consult on 05/22/19. Epworth 14. Sleep test showed moderate obstructive sleep apnea with AHI 24/hr. RX DME for CPAP 13cm H20 with large size fisher&Paykel full face mask. Maintained on Advair 250.   Previous [facility] pulmonary encounter: 07/15/2019 Patient presents today for 1 month follow-up. He is doing ok, having some issues with mask fit and pressure. He was unable to bring his SD card to today' s appointment. States that he only has been wearing his CPAP for about 3 hours each night. He feels pressure is not enough for him and that his mask is bothering his under eyes. Despite this he does report benefit from wearing CPAP. Reports less snoring and more energy during the day. He does complain of dry mouth, he is unsure how to adjust humidification on CPAP unit. He has an apt with his DME company today to try a different mask. He continues using Advair twice daily as prescribed, has not needed his rescue inhaler much. Experiences occasional wheezing. He has down down on smoking, states that he doesn't buy packs just singles. No vaping. Received influenza vaccine in late August.   08/12/2019 Patient presents today for 4 week follow-up OSA. He is doing well, recently stopped smoking 3 days ago. He is wearing CPAP most nights, states that he will fall asleep on the cough at bedtime and forget to put his mask on. No issues with pressure setting. He experiences some air leaks at the junction of the mask and tubing. Sleeping well through the night and has more energy.    No Known Allergies  Immunization History  Administered Date(s) Administered  .  Influenza,inj,Quad PF,6+ Mos 06/30/2017, 05/22/2019    Past Medical History:  Diagnosis Date  . Asthma   . Back pain   . Chronic pain   . Colon cancer ([place])   . Depression   . Hypertension   . Knee pain   . Neuropathy   . Obesity     Tobacco History: Social History   Tobacco Use  Smoking Status Current Every Day Smoker  . Packs/day: 0.50  . Years: 35.00  . Pack years: 17.50  . Types: Cigarettes  . Start date: 1985  Smokeless Tobacco Never Used  Tobacco Comment   05/22/19--2-3 cigs a day   Ready to quit: Not Answered Counseling given: Not Answered Comment: 05/22/19--2-3 cigs a day   Outpatient Medications Prior to Visit  Medication Sig Dispense Refill  . albuterol (PROVENTIL HFA;VENTOLIN HFA) 108 (90 Base) MCG/ACT inhaler Inhale 2 puffs into the lungs every 6 (six) hours as needed for wheezing or shortness of breath. 18 g 1  . amLODipine (NORVASC) 10 MG tablet TAKE ONE TABLET BY MOUTH DAILY 30 tablet 10  . celecoxib (CELEBREX) 200 MG capsule     . diclofenac sodium (VOLTAREN) 1 % GEL     . Fluticasone-Salmeterol (ADVAIR) 250-50 MCG/DOSE AEPB Inhale 1 puff into the lungs 2 (two) times daily. 14 each 1  . gabapentin (NEURONTIN) 300 MG capsule Take 1 capsule (300 mg total) by mouth 3 (three) times daily. 90 capsule 0  . lidocaine (LIDODERM) 5 % Place 1  patch onto the skin daily. Remove & Discard patch within 12 hours or as directed by MD 10 patch 0  . meloxicam (MOBIC) 15 MG tablet Take 1 tablet (15 mg total) by mouth daily. 30 tablet 0  . omeprazole (PRILOSEC) 40 MG capsule     . sucralfate (CARAFATE) 1 g tablet Take 1 g by mouth 4 (four) times daily.    Marland Kitchen acetaminophen (TYLENOL) 500 MG tablet Take 1 tablet (500 mg total) every 6 (six) hours as needed by mouth. (Patient not taking: Reported on 05/22/2019) 30 tablet 0  . amitriptyline (ELAVIL) 50 MG tablet Take 1 tablet (50 mg total) by mouth at bedtime. (Patient not taking: Reported on 08/12/2019) 30 tablet 0  . potassium  chloride SA (K-DUR) 20 MEQ tablet Take 1 tablet (20 mEq total) by mouth 2 (two) times daily. (Patient not taking: Reported on 08/12/2019) 14 tablet 0   No facility-administered medications prior to visit.    Review of Systems  Review of Systems  Constitutional: Negative.   HENT: Negative.   Respiratory: Negative.   Cardiovascular: Negative.    Physical Exam  BP 124/76 (BP Location: Left Arm, Patient Position: Sitting, Cuff Size: Normal)   Pulse 64   Temp (!) 97 F (36.1 C)   Ht 5\' 9"  (1.753 m)   Wt 209 lb 3.2 oz (94.9 kg)   SpO2 98% Comment: on room air  BMI 30.89 kg/m  Physical Exam Constitutional:      Appearance: Normal appearance. He is well-developed.  HENT:     Head: Normocephalic and atraumatic.  Neck:     Musculoskeletal: Normal range of motion and neck supple.  Cardiovascular:     Rate and Rhythm: Normal rate and regular rhythm.     Heart sounds: Normal heart sounds.  Pulmonary:     Effort: Pulmonary effort is normal. No respiratory distress.     Breath sounds: Normal breath sounds. No wheezing.     Comments: CTA Musculoskeletal: Normal range of motion.  Skin:    General: Skin is warm and dry.     Findings: No erythema or rash.  Neurological:     Mental Status: He is alert and oriented to person, place, and time.  Psychiatric:        Mood and Affect: Mood normal.        Behavior: Behavior normal.        Thought Content: Thought content normal.        Judgment: Judgment normal.      Lab Results:  CBC    Component Value Date/Time   WBC 6.5 05/15/2019 1345   RBC 5.12 05/15/2019 1345   HGB 14.5 05/15/2019 1345   HGB 14.0 02/28/2018 1538   HCT 44.8 05/15/2019 1345   HCT 42.6 02/28/2018 1538   PLT 242 05/15/2019 1345   PLT 266 02/28/2018 1538   MCV 87.5 05/15/2019 1345   MCV 86 02/28/2018 1538   MCH 28.3 05/15/2019 1345   MCHC 32.4 05/15/2019 1345   RDW 13.2 05/15/2019 1345   RDW 14.9 02/28/2018 1538   LYMPHSABS 2.2 02/28/2018 1538   MONOABS  0.6 09/20/2016 0856   EOSABS 0.2 02/28/2018 1538   BASOSABS 0.0 02/28/2018 1538    BMET    Component Value Date/Time   NA 136 05/15/2019 1345   NA 141 02/28/2018 1538   K 3.2 (L) 05/15/2019 1345   CL 106 05/15/2019 1345   CO2 20 (L) 05/15/2019 1345   GLUCOSE 113 (H) 05/15/2019 1345  BUN 11 05/15/2019 1345   BUN 7 02/28/2018 1538   CREATININE 0.92 05/15/2019 1345   CALCIUM 9.1 05/15/2019 1345   GFRNONAA >60 05/15/2019 1345   GFRAA >60 05/15/2019 1345    BNP No results found for: BNP  ProBNP No results found for: PROBNP  Imaging: No results found.   Assessment & Plan:   OSA on CPAP - Compliant with CPAP and reports benefit from use - Pressure 10-15cm H20 (12.9cm h20-95%); AHI 2.8 - Advised patient not to drive if experiencing excessive daytime fatigue or somnolence - Follow up in 6 months than annually   Asthma - Stable on current therapy - Continue Advair 250 and prn ventolin   Tobacco dependence - Patient has been smoke free for three days - Congratulated and encouraged continued cessation    Martyn Ehrich, NP 08/12/2019

## 2019-08-12 NOTE — Assessment & Plan Note (Signed)
-   Stable on current therapy - Continue Advair 250 and prn ventolin

## 2019-08-12 NOTE — Assessment & Plan Note (Addendum)
-   Patient has been smoke free for three days - Congratulated and encouraged continued cessation

## 2020-02-10 ENCOUNTER — Other Ambulatory Visit: Payer: Self-pay

## 2020-02-10 ENCOUNTER — Encounter: Payer: Self-pay | Admitting: Primary Care

## 2020-02-10 ENCOUNTER — Ambulatory Visit (INDEPENDENT_AMBULATORY_CARE_PROVIDER_SITE_OTHER): Payer: Medicare HMO | Admitting: Primary Care

## 2020-02-10 VITALS — BP 142/80 | HR 63 | Temp 98.1°F | Ht 69.0 in | Wt 216.8 lb

## 2020-02-10 DIAGNOSIS — G4733 Obstructive sleep apnea (adult) (pediatric): Secondary | ICD-10-CM | POA: Diagnosis not present

## 2020-02-10 DIAGNOSIS — Z9989 Dependence on other enabling machines and devices: Secondary | ICD-10-CM

## 2020-02-10 DIAGNOSIS — J45909 Unspecified asthma, uncomplicated: Secondary | ICD-10-CM

## 2020-02-10 NOTE — Patient Instructions (Addendum)
Sleep apnea: If this does not help notify us and we can change you to a nasal mask with chin strap  Aim to wear CPAP for minium of 4-6 hours every night   Asthma: Make sure you use Advair one puff TWICE daily   Recommendations: Speak with PCP about dyspnea and sweating, may want to consider further cardiac evaluation if no improvement with regular Advair use   Orders: Change CPAP back set pressure of 13cm h20  Follow-up: 3-4 months with Dr. Elsworth Soho or [facility]

## 2020-02-10 NOTE — Assessment & Plan Note (Addendum)
-   Poor compliance, reports suffocating with full CPAP mask and current pressure setting - Current pressure 10-15cm h20; AHI 2.3 - Change CPAP back to set pressure of 13cm h20 - If pressure change does not help patient to notify us and we can try nasal mask with chin strap  - Aim to wear CPAP for minium of 4-6 hours every night  - FU 3 months with Dr. Elsworth Soho or APP

## 2020-02-10 NOTE — Progress Notes (Signed)
@Patient  ID: Adah Perl, male    DOB: Jul 24, 1961, 59 y.o.   MRN: [id number]  Chief Complaint  Patient presents with  . Follow-up    Feels like suffocating when wearing,pr. ok.Not wearing now, sob with exertion    Referring provider: Bonnita Hollow, MD  HPI: 59 year old male, 1/2 pack current every day smoker. PMH significant for asthma, OSA, hypersomnolence, obesity, tobacco abuse. Patient of Dr. Elsworth Soho, seen for initial consult on 05/22/19. Epworth 14. Sleep test showed moderate obstructive sleep apnea with AHI 24/hr. RX DME for CPAP 13cm H20 with large size fisher&Paykel full face mask. Maintained on Advair 250.   Previous [facility] pulmonary encounter: 07/15/2019 Patient presents today for 1 month follow-up. He is doing ok, having some issues with mask fit and pressure. He was unable to bring his SD card to today' s appointment. States that he only has been wearing his CPAP for about 3 hours each night. He feels pressure is not enough for him and that his mask is bothering his under eyes. Despite this he does report benefit from wearing CPAP. Reports less snoring and more energy during the day. He does complain of dry mouth, he is unsure how to adjust humidification on CPAP unit. He has an apt with his DME company today to try a different mask. He continues using Advair twice daily as prescribed, has not needed his rescue inhaler much. Experiences occasional wheezing. He has down down on smoking, states that he doesn't buy packs just singles. No vaping. Received influenza vaccine in late August. CPAP pressure changed to 10-15cm h20.   08/12/2019 Patient presents today for 4 week follow-up OSA. He is doing well, recently stopped smoking 3 days ago. He is wearing CPAP most nights, states that he will fall asleep on the cough at bedtime and forget to put his mask on. No issues with pressure setting. He experiences some air leaks at the junction of the mask and tubing. Sleeping well through the night  and has more energy.   02/10/2020  Patient presents today for regular follow-up OSA/COPD. Poor compliance with CPAP use. Feels like he is suffocating when he is wearing his CPAP. States that he wakes up in the middle of the night and feels like his mask is cutting his air off. States that he gets short of breath all the time on exertion. Family member states that she notices him breathing heavy and sweating when he exerts himself. He is only using Advair as needed, approx once a week. Uses Albuterol rescue inhaler more frequently. There is no barrier to use such as cost. He has an appointment with his PCP in July. Denies fever, chills, chest pain, N/V/D.  [facility] 12/18/19-01/16/20 6/30 days (20%) Pressure 10-15 cm h20 (11.8- 95%) Airleaks 35.2 L min AHI 2.3  No Known Allergies  Immunization History  Administered Date(s) Administered  . Influenza,inj,Quad PF,6+ Mos 06/30/2017, 05/22/2019    Past Medical History:  Diagnosis Date  . Asthma   . Back pain   . Chronic pain   . Colon cancer ([place])   . Depression   . Hypertension   . Knee pain   . Neuropathy   . Obesity     Tobacco History: Social History   Tobacco Use  Smoking Status Current Every Day Smoker  . Packs/day: 0.50  . Years: 35.00  . Pack years: 17.50  . Types: Cigarettes  . Start date: 1985  Smokeless Tobacco Never Used  Tobacco Comment   05/22/19--2-3  cigs a day   Ready to quit: Not Answered Counseling given: Not Answered Comment: 05/22/19--2-3 cigs a day   Outpatient Medications Prior to Visit  Medication Sig Dispense Refill  . albuterol (PROVENTIL HFA;VENTOLIN HFA) 108 (90 Base) MCG/ACT inhaler Inhale 2 puffs into the lungs every 6 (six) hours as needed for wheezing or shortness of breath. 18 g 1  . amLODipine (NORVASC) 10 MG tablet TAKE ONE TABLET BY MOUTH DAILY 30 tablet 10  . celecoxib (CELEBREX) 200 MG capsule Take 200 mg by mouth daily.     . diclofenac sodium (VOLTAREN) 1 % GEL     .  Fluticasone-Salmeterol (ADVAIR) 250-50 MCG/DOSE AEPB Inhale 1 puff into the lungs 2 (two) times daily. 14 each 1  . lidocaine (LIDODERM) 5 % Place 1 patch onto the skin daily. Remove & Discard patch within 12 hours or as directed by MD 10 patch 0  . meloxicam (MOBIC) 15 MG tablet Take 1 tablet (15 mg total) by mouth daily. 30 tablet 0  . gabapentin (NEURONTIN) 300 MG capsule Take 1 capsule (300 mg total) by mouth 3 (three) times daily. (Patient not taking: Reported on 02/10/2020) 90 capsule 0  . omeprazole (PRILOSEC) 40 MG capsule     . sucralfate (CARAFATE) 1 g tablet Take 1 g by mouth 4 (four) times daily.     No facility-administered medications prior to visit.      Review of Systems  Review of Systems  Respiratory: Positive for shortness of breath.    Physical Exam  BP (!) 142/80 (BP Location: Right Arm, Cuff Size: Normal)   Pulse 63   Temp 98.1 F (36.7 C) (Temporal)   Ht 5\' 9"  (1.753 m)   Wt 216 lb 12.8 oz (98.3 kg)   SpO2 100%   BMI 32.02 kg/m  Physical Exam Constitutional:      Appearance: Normal appearance.  HENT:     Head: Normocephalic and atraumatic.     Right Ear: There is impacted cerumen.     Mouth/Throat:     Mouth: Mucous membranes are moist.     Pharynx: Oropharynx is clear.  Cardiovascular:     Rate and Rhythm: Normal rate and regular rhythm.     Comments: RRR Pulmonary:     Effort: Pulmonary effort is normal.     Breath sounds: Normal breath sounds.     Comments: CTA Musculoskeletal:        General: Normal range of motion.  Skin:    General: Skin is warm and dry.  Neurological:     General: No focal deficit present.     Mental Status: He is alert and oriented to person, place, and time. Mental status is at baseline.  Psychiatric:        Mood and Affect: Mood normal.        Behavior: Behavior normal.        Thought Content: Thought content normal.        Judgment: Judgment normal.      Lab Results:  CBC    Component Value Date/Time    WBC 6.5 05/15/2019 1345   RBC 5.12 05/15/2019 1345   HGB 14.5 05/15/2019 1345   HGB 14.0 02/28/2018 1538   HCT 44.8 05/15/2019 1345   HCT 42.6 02/28/2018 1538   PLT 242 05/15/2019 1345   PLT 266 02/28/2018 1538   MCV 87.5 05/15/2019 1345   MCV 86 02/28/2018 1538   MCH 28.3 05/15/2019 1345   MCHC 32.4 05/15/2019 1345  RDW 13.2 05/15/2019 1345   RDW 14.9 02/28/2018 1538   LYMPHSABS 2.2 02/28/2018 1538   MONOABS 0.6 09/20/2016 0856   EOSABS 0.2 02/28/2018 1538   BASOSABS 0.0 02/28/2018 1538    BMET    Component Value Date/Time   NA 136 05/15/2019 1345   NA 141 02/28/2018 1538   K 3.2 (L) 05/15/2019 1345   CL 106 05/15/2019 1345   CO2 20 (L) 05/15/2019 1345   GLUCOSE 113 (H) 05/15/2019 1345   BUN 11 05/15/2019 1345   BUN 7 02/28/2018 1538   CREATININE 0.92 05/15/2019 1345   CALCIUM 9.1 05/15/2019 1345   GFRNONAA >60 05/15/2019 1345   GFRAA >60 05/15/2019 1345    BNP No results found for: BNP  ProBNP No results found for: PROBNP  Imaging: No results found.   Assessment & Plan:   OSA on CPAP - Poor compliance, reports suffocating with full CPAP mask and current pressure setting - Current pressure 10-15cm h20; AHI 2.3 - Change CPAP back to set pressure of 13cm h20 - If pressure change does not help patient to notify us and we can try nasal mask with chin strap  - Aim to wear CPAP for minium of 4-6 hours every night  - FU 3 months with Dr. Elsworth Soho or APP   Asthma - Reports heavy breathing and sweating on exertion  - Encouraged patient to use Advair 250-9mcg one puff TWICE daily; continue Albuterol hfa 2 puffs every 4-6 hours for break through shortness of breath or wheezing   - If no improvement in dyspnea recommend further evaluation/cardiac testing. He has some risk factors for coronary artery disease including obesity, tobacco use, hypertension and family history heart disease.   Martyn Ehrich, NP 02/10/2020

## 2020-02-10 NOTE — Assessment & Plan Note (Addendum)
-   Reports heavy breathing and sweating on exertion  - Encouraged patient to use Advair 250-49mcg one puff TWICE daily; continue Albuterol hfa 2 puffs every 4-6 hours for break through shortness of breath or wheezing   - If no improvement in dyspnea recommend further evaluation/cardiac testing. He has some risk factors for coronary artery disease including obesity, tobacco use, hypertension and family history heart disease.

## 2020-04-23 ENCOUNTER — Ambulatory Visit: Payer: Medicare HMO | Admitting: Physician Assistant

## 2020-05-11 NOTE — Progress Notes (Deleted)
@Patient  ID: Benjamin Abbott, male    DOB: April 16, 1961, 59 y.o.   MRN: [id number]  No chief complaint on file.   Referring provider: Bonnita Hollow, MD  HPI:  59 year old male, 1/2 pack current every day smoker. PMH significant for asthma, OSA, hypersomnolence, obesity, tobacco abuse. Patient of Dr. Elsworth Soho, seen for initial consult on 05/22/19. Epworth 14. Sleep test showed moderate obstructive sleep apnea with AHI 24/hr. RX DME for CPAP 13cm H20 with large size fisher&Paykel full face mask. Maintained on Advair 250.   Previous [facility] pulmonary encounter: 07/15/2019 Patient presents today for 1 month follow-up. He is doing ok, having some issues with mask fit and pressure. He was unable to bring his SD card to today' s appointment. States that he only has been wearing his CPAP for about 3 hours each night. He feels pressure is not enough for him and that his mask is bothering his under eyes. Despite this he does report benefit from wearing CPAP. Reports less snoring and more energy during the day. He does complain of dry mouth, he is unsure how to adjust humidification on CPAP unit. He has an apt with his DME company today to try a different mask. He continues using Advair twice daily as prescribed, has not needed his rescue inhaler much. Experiences occasional wheezing. He has down down on smoking, states that he doesn't buy packs just singles. No vaping. Received influenza vaccine in late August. CPAP pressure changed to 10-15cm h20.   08/12/2019 Patient presents today for 4 week follow-up OSA. He is doing well, recently stopped smoking 3 days ago. He is wearing CPAP most nights, states that he will fall asleep on the cough at bedtime and forget to put his mask on. No issues with pressure setting. He experiences some air leaks at the junction of the mask and tubing. Sleeping well through the night and has more energy.   02/10/2020  Patient presents today for regular follow-up OSA/COPD. Poor  compliance with CPAP use. Feels like he is suffocating when he is wearing his CPAP. States that he wakes up in the middle of the night and feels like his mask is cutting his air off. States that he gets short of breath all the time on exertion. Family member states that she notices him breathing heavy and sweating when he exerts himself. He is only using Advair as needed, approx once a week. Uses Albuterol rescue inhaler more frequently. There is no barrier to use such as cost. He has an appointment with his PCP in July. Denies fever, chills, chest pain, N/V/D.  [facility] 12/18/19-01/16/20: 6/30 days (20%) Pressure 10-15 cm h20 (11.8- 95%) Airleaks 35.2 L min AHI 2.3  05/12/2020- interim hx Patient presents today for 3 month follow-up. During last visit he stated that he felt like he was suffocating when wearing CPAP. He was previously only 20% compliant with CPAP use. Changed CPAP to set pressure 13cm h20. Patient also reported heavy breathing and sweating on exertion. Encouraged patinet use Advair one puff twice daily scheduled.    Does that mean he is not getting enough pressure or too little?   No Known Allergies  Immunization History  Administered Date(s) Administered  . Influenza,inj,Quad PF,6+ Mos 06/30/2017, 05/22/2019    Past Medical History:  Diagnosis Date  . Asthma   . Back pain   . Chronic pain   . Colon cancer ([place])   . Depression   . Hypertension   . Knee pain   .  Neuropathy   . Obesity     Tobacco History: Social History   Tobacco Use  Smoking Status Current Every Day Smoker  . Packs/day: 0.50  . Years: 35.00  . Pack years: 17.50  . Types: Cigarettes  . Start date: 1985  Smokeless Tobacco Never Used  Tobacco Comment   05/22/19--2-3 cigs a day   Ready to quit: Not Answered Counseling given: Not Answered Comment: 05/22/19--2-3 cigs a day   Outpatient Medications Prior to Visit  Medication Sig Dispense Refill  . albuterol (PROVENTIL HFA;VENTOLIN HFA) 108  (90 Base) MCG/ACT inhaler Inhale 2 puffs into the lungs every 6 (six) hours as needed for wheezing or shortness of breath. 18 g 1  . amLODipine (NORVASC) 10 MG tablet TAKE ONE TABLET BY MOUTH DAILY 30 tablet 10  . celecoxib (CELEBREX) 200 MG capsule Take 200 mg by mouth daily.     . diclofenac sodium (VOLTAREN) 1 % GEL     . Fluticasone-Salmeterol (ADVAIR) 250-50 MCG/DOSE AEPB Inhale 1 puff into the lungs 2 (two) times daily. 14 each 1  . gabapentin (NEURONTIN) 300 MG capsule Take 1 capsule (300 mg total) by mouth 3 (three) times daily. (Patient not taking: Reported on 02/10/2020) 90 capsule 0  . lidocaine (LIDODERM) 5 % Place 1 patch onto the skin daily. Remove & Discard patch within 12 hours or as directed by MD 10 patch 0  . meloxicam (MOBIC) 15 MG tablet Take 1 tablet (15 mg total) by mouth daily. 30 tablet 0  . omeprazole (PRILOSEC) 40 MG capsule     . sucralfate (CARAFATE) 1 g tablet Take 1 g by mouth 4 (four) times daily.     No facility-administered medications prior to visit.      Review of Systems  Review of Systems   Physical Exam  There were no vitals taken for this visit. Physical Exam   Lab Results:  CBC    Component Value Date/Time   WBC 6.5 05/15/2019 1345   RBC 5.12 05/15/2019 1345   HGB 14.5 05/15/2019 1345   HGB 14.0 02/28/2018 1538   HCT 44.8 05/15/2019 1345   HCT 42.6 02/28/2018 1538   PLT 242 05/15/2019 1345   PLT 266 02/28/2018 1538   MCV 87.5 05/15/2019 1345   MCV 86 02/28/2018 1538   MCH 28.3 05/15/2019 1345   MCHC 32.4 05/15/2019 1345   RDW 13.2 05/15/2019 1345   RDW 14.9 02/28/2018 1538   LYMPHSABS 2.2 02/28/2018 1538   MONOABS 0.6 09/20/2016 0856   EOSABS 0.2 02/28/2018 1538   BASOSABS 0.0 02/28/2018 1538    BMET    Component Value Date/Time   NA 136 05/15/2019 1345   NA 141 02/28/2018 1538   K 3.2 (L) 05/15/2019 1345   CL 106 05/15/2019 1345   CO2 20 (L) 05/15/2019 1345   GLUCOSE 113 (H) 05/15/2019 1345   BUN 11 05/15/2019 1345     BUN 7 02/28/2018 1538   CREATININE 0.92 05/15/2019 1345   CALCIUM 9.1 05/15/2019 1345   GFRNONAA >60 05/15/2019 1345   GFRAA >60 05/15/2019 1345    BNP No results found for: BNP  ProBNP No results found for: PROBNP  Imaging: No results found.   Assessment & Plan:   No problem-specific Assessment & Plan notes found for this encounter.     Martyn Ehrich, NP 05/11/2020

## 2020-05-12 ENCOUNTER — Ambulatory Visit: Payer: Medicare HMO | Admitting: Primary Care

## 2020-05-15 NOTE — Progress Notes (Signed)
@Patient  ID: Benjamin Abbott, male    DOB: 09-Dec-1960, 59 y.o.   MRN: [id number]  Chief Complaint  Patient presents with  . Follow-up    Pt states he has been doing okay since last visit. States that he has had some problems with SOB and that his PCP is going to start him on neb meds. Pt states that he has been having problems with his CPAP and states that he feels like he is suffocating when wearing it.    Referring provider: Bonnita Hollow, MD  HPI: 59 year old male, 1/2 pack current every day smoker. PMH significant for asthma, OSA, hypersomnolence, obesity, tobacco abuse. Patient of Dr. Elsworth Soho, seen for initial consult on 05/22/19. Epworth 14. Sleep test showed moderate obstructive sleep apnea with AHI 24/hr. RX DME for CPAP 13cm H20 with large size [employer] full face mask. Maintained on Advair 250.   Previous [facility] encounter: 07/15/2019 Patient presents today for 1 month follow-up. He is doing ok, having some issues with mask fit and pressure. He was unable to bring his SD card to today' s appointment. States that he only has been wearing his CPAP for about 3 hours each night. He feels pressure is not enough for him and that his mask is bothering his under eyes. Despite this he does report benefit from wearing CPAP. Reports less snoring and more energy during the day. He does complain of dry mouth, he is unsure how to adjust humidification on CPAP unit. He has an apt with his DME company today to try a different mask. He continues using Advair twice daily as prescribed, has not needed his rescue inhaler much. Experiences occasional wheezing. He has down down on smoking, states that he doesn't buy packs just singles. No vaping. Received influenza vaccine in late August. CPAP pressure changed to 10-15cm h20.   08/12/2019 Patient presents today for 4 week follow-up OSA. He is doing well, recently stopped smoking 3 days ago. He is wearing CPAP most nights, states that he will fall  asleep on the cough at bedtime and forget to put his mask on. No issues with pressure setting. He experiences some air leaks at the junction of the mask and tubing. Sleeping well through the night and has more energy.   02/10/2020  Patient presents today for regular follow-up OSA. Poor compliance with CPAP use. Feels like he is suffocating when he is wearing his CPAP. States that he wakes up in the middle of the night and feels like his mask is cutting his air off. States that he gets short of breath all the time on exertion. Family member states that she notices him breathing heavy and sweating when he exerts himself. He is only using Advair as needed, approx once a week. Uses Albuterol rescue inhaler more frequently. There is no barrier to use such as cost. He has an appointment with his PCP in July. Denies fever, chills, chest pain, N/V/D.  [facility]view 12/18/19-01/16/20: 6/30 days (20%) Pressure 10-15 cm h20 (11.8- 95%) Airleaks 35.2 L min AHI 2.3  05/18/2020- Interim hx Patient presents today for 3 month follow-up. He has not been using CPAP for the last month, reports feeling like he is suffocating. States that he feels CPAP is cutting off his air. He is still snoring. He stopped using CPAP on July 11th. He was previously only 20% compliant with CPAP use.  During last visit we changed to set pressure 13cm h20 (previously 10-15cm h20). He reports shortness of breath  with activity, states that he PCP was suppose to send in nebulized medication for him. He has been compliant with using Advair 250 one puff twice daily. He has stopped smoking, feels like he is able to take deeper breaths since he quit smoking. Chest does not feel as tight. He has not received COVID vaccination. Denies significant cough, purulent mucus, chest tightness or wheezing.   Airview Download 03/18/20-05/16/20: 12/60 days used (20%) Average usage days used 5 hours 57 mins Pressure 13cm h20 AHI 3.1    No Known  Allergies  Immunization History  Administered Date(s) Administered  . Influenza,inj,Quad PF,6+ Mos 06/30/2017, 05/22/2019    Past Medical History:  Diagnosis Date  . Asthma   . Back pain   . Chronic pain   . Colon cancer ([place])   . Depression   . Hypertension   . Knee pain   . Neuropathy   . Obesity     Tobacco History: Social History   Tobacco Use  Smoking Status Former Smoker  . Packs/day: 0.50  . Years: 35.00  . Pack years: 17.50  . Types: Cigarettes  . Start date: 8  . Quit date: 03/18/2020  . Years since quitting: 0.1  Smokeless Tobacco Never Used   Counseling given: Not Answered   Outpatient Medications Prior to Visit  Medication Sig Dispense Refill  . albuterol (PROVENTIL HFA;VENTOLIN HFA) 108 (90 Base) MCG/ACT inhaler Inhale 2 puffs into the lungs every 6 (six) hours as needed for wheezing or shortness of breath. 18 g 1  . amLODipine (NORVASC) 10 MG tablet TAKE ONE TABLET BY MOUTH DAILY 30 tablet 10  . celecoxib (CELEBREX) 200 MG capsule Take 200 mg by mouth daily.     . diclofenac sodium (VOLTAREN) 1 % GEL     . Fluticasone-Salmeterol (ADVAIR) 250-50 MCG/DOSE AEPB Inhale 1 puff into the lungs 2 (two) times daily. 14 each 1  . gabapentin (NEURONTIN) 300 MG capsule Take 1 capsule (300 mg total) by mouth 3 (three) times daily. 90 capsule 0  . lidocaine (LIDODERM) 5 % Place 1 patch onto the skin daily. Remove & Discard patch within 12 hours or as directed by MD 10 patch 0  . meloxicam (MOBIC) 15 MG tablet Take 1 tablet (15 mg total) by mouth daily. 30 tablet 0  . omeprazole (PRILOSEC) 40 MG capsule     . sucralfate (CARAFATE) 1 g tablet Take 1 g by mouth 4 (four) times daily.     No facility-administered medications prior to visit.    Review of Systems  Review of Systems  Constitutional: Negative.   Respiratory: Positive for shortness of breath. Negative for cough, chest tightness and wheezing.     Physical Exam  BP 120/82 (BP Location: Left Arm,  Cuff Size: Normal)   Pulse (!) 55   Ht 5\' 9"  (1.753 m)   Wt 205 lb 6.4 oz (93.2 kg)   SpO2 100%   BMI 30.33 kg/m  Physical Exam Constitutional:      Appearance: Normal appearance.  HENT:     Head: Normocephalic and atraumatic.  Cardiovascular:     Rate and Rhythm: Normal rate and regular rhythm.  Musculoskeletal:        General: Normal range of motion.  Skin:    General: Skin is warm and dry.  Neurological:     General: No focal deficit present.     Mental Status: He is alert and oriented to person, place, and time. Mental status is at baseline.  Psychiatric:  Mood and Affect: Mood normal.        Behavior: Behavior normal.        Thought Content: Thought content normal.        Judgment: Judgment normal.      Lab Results:  CBC    Component Value Date/Time   WBC 6.5 05/15/2019 1345   RBC 5.12 05/15/2019 1345   HGB 14.5 05/15/2019 1345   HGB 14.0 02/28/2018 1538   HCT 44.8 05/15/2019 1345   HCT 42.6 02/28/2018 1538   PLT 242 05/15/2019 1345   PLT 266 02/28/2018 1538   MCV 87.5 05/15/2019 1345   MCV 86 02/28/2018 1538   MCH 28.3 05/15/2019 1345   MCHC 32.4 05/15/2019 1345   RDW 13.2 05/15/2019 1345   RDW 14.9 02/28/2018 1538   LYMPHSABS 2.2 02/28/2018 1538   MONOABS 0.6 09/20/2016 0856   EOSABS 0.2 02/28/2018 1538   BASOSABS 0.0 02/28/2018 1538    BMET    Component Value Date/Time   NA 136 05/15/2019 1345   NA 141 02/28/2018 1538   K 3.2 (L) 05/15/2019 1345   CL 106 05/15/2019 1345   CO2 20 (L) 05/15/2019 1345   GLUCOSE 113 (H) 05/15/2019 1345   BUN 11 05/15/2019 1345   BUN 7 02/28/2018 1538   CREATININE 0.92 05/15/2019 1345   CALCIUM 9.1 05/15/2019 1345   GFRNONAA >60 05/15/2019 1345   GFRAA >60 05/15/2019 1345    BNP No results found for: BNP  ProBNP No results found for: PROBNP  Imaging: No results found.   Assessment & Plan:   Asthma - Former smoker, quit in July 2021. Reports dyspnea on exertion. Denies cough, chest tightness,  wheezing. No PFTs on file  - Continue Advair 250 one puff twice daily (rinse mouth after use) - If you do not receive nebulized medication from PCP let our office know and we can send prescription  - Recommend obtaining PFTs in 3 months with follow-up office visit    OSA on CPAP - Patient has poor compliance with CPAP, reports "suffocating". States that his air is being cut off and he is still snoring. He would like to try nasal mask. - DME order for nasal mask of choice and chin strap, change pressure to auto titrate 5-20cm h20 (then we can narrow pressure setting) - Continue to encourage patient to wear CPAP every night for 4-6 hours - Recommend he try wearing cpap mask during the day for 10 min without CPAP device on. He may need to try desensitization study if still having troubl - FU in 3 months with Dr. Candise Che, NP 05/18/2020

## 2020-05-17 NOTE — Patient Instructions (Addendum)
Nice seeing you today Ms Lall, congratulations on quitting smoking!!!!  COPD: - Continue Advair 250 one puff twice daily (rinse mouth after use) - If you do not receive nebulized medication from PCP let our office know and we can send prescription in   OSA: - Continue to wear CPAP minimum 4 hours or more each night - Do not drive if experiencing excessive daytime fatigue  - Use saline nasal spray 1 spray per nostril twice daily  - If you are having trouble getting comfortable with mask, try wearing during the day for 10 min without CPAP device turned on. Then try it during the day with the pressure to get used to feeling.   Orders: - Change CPAP pressure to auto titrate 5-20cm h20. Please provide nasal mask of choice and chin strap  - PFTs in 3 months   Follow-up: - 3 months with Dr. Elsworth Soho

## 2020-05-18 ENCOUNTER — Encounter: Payer: Self-pay | Admitting: Primary Care

## 2020-05-18 ENCOUNTER — Ambulatory Visit (INDEPENDENT_AMBULATORY_CARE_PROVIDER_SITE_OTHER): Payer: Medicare HMO | Admitting: Primary Care

## 2020-05-18 ENCOUNTER — Other Ambulatory Visit: Payer: Self-pay

## 2020-05-18 VITALS — BP 120/82 | HR 55 | Ht 69.0 in | Wt 205.4 lb

## 2020-05-18 DIAGNOSIS — G4733 Obstructive sleep apnea (adult) (pediatric): Secondary | ICD-10-CM | POA: Diagnosis not present

## 2020-05-18 DIAGNOSIS — J45909 Unspecified asthma, uncomplicated: Secondary | ICD-10-CM

## 2020-05-18 DIAGNOSIS — Z9989 Dependence on other enabling machines and devices: Secondary | ICD-10-CM

## 2020-05-18 NOTE — Assessment & Plan Note (Signed)
-   Former smoker, quit in July 2021. Reports dyspnea on exertion. Denies cough, chest tightness, wheezing. No PFTs on file  - Continue Advair 250 one puff twice daily (rinse mouth after use) - If you do not receive nebulized medication from PCP let our office know and we can send prescription  - Recommend obtaining PFTs in 3 months with follow-up office visit

## 2020-05-18 NOTE — Assessment & Plan Note (Signed)
-   Patient has poor compliance with CPAP, reports "suffocating". States that his air is being cut off and he is still snoring. He would like to try nasal mask. - DME order for nasal mask of choice and chin strap, change pressure to auto titrate 5-20cm h20 (then we can narrow pressure setting) - Continue to encourage patient to wear CPAP every night for 4-6 hours - Recommend he try wearing cpap mask during the day for 10 min without CPAP device on. He may need to try desensitization study if still having troubl - FU in 3 months with Dr. Elsworth Soho

## 2020-06-03 ENCOUNTER — Telehealth: Payer: Self-pay | Admitting: Primary Care

## 2020-06-03 DIAGNOSIS — Z9989 Dependence on other enabling machines and devices: Secondary | ICD-10-CM

## 2020-06-03 NOTE — Telephone Encounter (Signed)
Spoke with the pt  He states CPAP machine keeps telling him he has a mask malfunction  He states that he uses a full face mask and does not see any issues with this, but wonders if he should try a nasal mask  Please advise thanks

## 2020-06-03 NOTE — Telephone Encounter (Signed)
We can try nasal mask, not sure this will fix it but ok with me. If still malfunctioning needs [employer] and will need to contact DME company to notify them

## 2020-06-04 NOTE — Telephone Encounter (Signed)
Spoke with the pt and notified of response per [facility]  He verbalized understanding  Order sent for nasal mask

## 2020-08-24 ENCOUNTER — Other Ambulatory Visit: Payer: Self-pay

## 2020-08-24 ENCOUNTER — Emergency Department (HOSPITAL_COMMUNITY): Payer: Medicare HMO

## 2020-08-24 ENCOUNTER — Encounter (HOSPITAL_COMMUNITY): Payer: Self-pay

## 2020-08-24 ENCOUNTER — Emergency Department (HOSPITAL_COMMUNITY)
Admission: EM | Admit: 2020-08-24 | Discharge: 2020-08-24 | Disposition: A | Discharge status: Home / Self Care | Payer: Medicare HMO | Attending: Emergency Medicine | Admitting: Emergency Medicine

## 2020-08-24 DIAGNOSIS — N492 Inflammatory disorders of scrotum: Secondary | ICD-10-CM

## 2020-08-24 DIAGNOSIS — J45909 Unspecified asthma, uncomplicated: Secondary | ICD-10-CM | POA: Diagnosis not present

## 2020-08-24 DIAGNOSIS — R9389 Abnormal findings on diagnostic imaging of other specified body structures: Secondary | ICD-10-CM

## 2020-08-24 DIAGNOSIS — Z87891 Personal history of nicotine dependence: Secondary | ICD-10-CM | POA: Diagnosis not present

## 2020-08-24 DIAGNOSIS — I10 Essential (primary) hypertension: Secondary | ICD-10-CM | POA: Insufficient documentation

## 2020-08-24 DIAGNOSIS — R935 Abnormal findings on diagnostic imaging of other abdominal regions, including retroperitoneum: Secondary | ICD-10-CM | POA: Insufficient documentation

## 2020-08-24 DIAGNOSIS — Z79899 Other long term (current) drug therapy: Secondary | ICD-10-CM | POA: Insufficient documentation

## 2020-08-24 LAB — CBC WITH DIFFERENTIAL/PLATELET
Abs Immature Granulocytes: 0.03 10*3/uL (ref 0.00–0.07)
Basophils Absolute: 0 10*3/uL (ref 0.0–0.1)
Basophils Relative: 0 %
Eosinophils Absolute: 0.2 10*3/uL (ref 0.0–0.5)
Eosinophils Relative: 2 %
HCT: 40.5 % (ref 39.0–52.0)
Hemoglobin: 13 g/dL (ref 13.0–17.0)
Immature Granulocytes: 0 %
Lymphocytes Relative: 21 %
Lymphs Abs: 1.6 10*3/uL (ref 0.7–4.0)
MCH: 27.9 pg (ref 26.0–34.0)
MCHC: 32.1 g/dL (ref 30.0–36.0)
MCV: 86.9 fL (ref 80.0–100.0)
Monocytes Absolute: 0.5 10*3/uL (ref 0.1–1.0)
Monocytes Relative: 7 %
Neutro Abs: 5.2 10*3/uL (ref 1.7–7.7)
Neutrophils Relative %: 70 %
Platelets: 245 10*3/uL (ref 150–400)
RBC: 4.66 MIL/uL (ref 4.22–5.81)
RDW: 14.3 % (ref 11.5–15.5)
WBC: 7.5 10*3/uL (ref 4.0–10.5)
nRBC: 0 % (ref 0.0–0.2)

## 2020-08-24 LAB — LACTIC ACID, PLASMA: Lactic Acid, Venous: 0.8 mmol/L (ref 0.5–1.9)

## 2020-08-24 LAB — BASIC METABOLIC PANEL
Anion gap: 11 (ref 5–15)
BUN: 21 mg/dL — ABNORMAL HIGH (ref 6–20)
CO2: 22 mmol/L (ref 22–32)
Calcium: 9.5 mg/dL (ref 8.9–10.3)
Chloride: 106 mmol/L (ref 98–111)
Creatinine, Ser: 0.93 mg/dL (ref 0.61–1.24)
GFR, Estimated: >60 mL/min (ref >60)
Glucose, Bld: 106 mg/dL — ABNORMAL HIGH (ref 70–99)
Potassium: 4.1 mmol/L (ref 3.5–5.1)
Sodium: 139 mmol/L (ref 135–145)

## 2020-08-24 LAB — URINALYSIS, ROUTINE W REFLEX MICROSCOPIC
Bilirubin Urine: NEGATIVE
Glucose, UA: NEGATIVE mg/dL
Hgb urine dipstick: NEGATIVE
Ketones, ur: NEGATIVE mg/dL
Leukocytes,Ua: NEGATIVE
Nitrite: NEGATIVE
Protein, ur: NEGATIVE mg/dL
Specific Gravity, Urine: >1.046 — ABNORMAL HIGH (ref 1.005–1.030)
pH: 5 (ref 5.0–8.0)

## 2020-08-24 LAB — CBG MONITORING, ED: Glucose-Capillary: 100 mg/dL — ABNORMAL HIGH (ref 70–99)

## 2020-08-24 MED ORDER — DOXYCYCLINE HYCLATE 100 MG PO TABS
100.0000 mg | ORAL_TABLET | Freq: Once | ORAL | Status: AC
  Administered 2020-08-24: 100 mg via ORAL
  Filled 2020-08-24: qty 1

## 2020-08-24 MED ORDER — DOXYCYCLINE HYCLATE 100 MG PO CAPS: 100.0000 mg | ORAL_CAPSULE | Freq: Two times a day (BID) | ORAL | 0 refills | Status: AC

## 2020-08-24 MED ORDER — OXYCODONE-ACETAMINOPHEN 5-325 MG PO TABS
1.0000 | ORAL_TABLET | Freq: Four times a day (QID) | ORAL | 0 refills | Status: DC | PRN
Start: 2020-08-24 — End: 2022-06-03

## 2020-08-24 MED ORDER — HYDROMORPHONE HCL 1 MG/ML IJ SOLN
1.0000 mg | Freq: Once | INTRAMUSCULAR | Status: AC
  Administered 2020-08-24: 1 mg via INTRAVENOUS
  Filled 2020-08-24: qty 1

## 2020-08-24 MED ORDER — IOHEXOL 300 MG/ML  SOLN
100.0000 mL | Freq: Once | INTRAMUSCULAR | Status: AC | PRN
  Administered 2020-08-24: 100 mL via INTRAVENOUS

## 2020-08-24 MED ORDER — LIDOCAINE HCL (PF) 1 % IJ SOLN
5.0000 mL | Freq: Once | INTRAMUSCULAR | Status: AC
  Administered 2020-08-24: 5 mL
  Filled 2020-08-24: qty 5

## 2020-08-24 NOTE — Discharge Instructions (Signed)
Please follow up with [facility] for recheck of your scrotal abscess. Call their office to schedule an appointment. They can remove your packing in 48 hours time or you can return to the ED in 48 hours for packing removal.   Pick up antibiotics and take as prescribed. It is very important to finish the entire course.   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 It is recommended that you have an MRI of your femur done with and without contrast. Please follow up with your PCP regarding this as they can schedule it in the outpatient setting.   Return to the ED for any worsening symptoms

## 2020-08-24 NOTE — ED Triage Notes (Signed)
Pt reports abscess to his left testicle for the past week, purulent drainage noted 4 days ago. Denies fever

## 2020-08-24 NOTE — ED Provider Notes (Signed)
[facility] EMERGENCY DEPARTMENT Provider Note   CSN: [id number] Arrival date & time: 08/24/20  1694     History Chief Complaint  Patient presents with  . Abscess    Benjamin Abbott is a 59 y.o. male with PMHx HTN, obesity, and neuropathy who presents to the ED today with complaint of left scrotal abscess x 1 week. Pt reports it started out as a small knot and has grown in size. He states that it opened up and started draining thick pus 4 days ago. Pt went to his PCP's office today and was sent here for further evaluation. He denies history of diabetes. Pt does not believe he has had fevers or chills at home. No other complaints at this time. He has never had a scrotal abscess in the past.   The history is provided by the patient and medical records.       Past Medical History:  Diagnosis Date  . Asthma   . Back pain   . Chronic pain   . Colon cancer ([place])   . Depression   . Hypertension   . Knee pain   . Neuropathy   . Obesity     Patient Active Problem List   Diagnosis Date Noted  . OSA on CPAP 07/15/2019  . Hypersomnolence 05/22/2019  . Lack of appetite 02/28/2018  . Chronic midline thoracic back pain 07/14/2017  . Tobacco dependence 07/14/2017  . Essential hypertension 07/14/2017  . Low back pain radiating to both legs 06/08/2017  . Asthma 06/08/2017  . Bilateral knee pain 06/08/2017  . Obesity 06/08/2017    Past Surgical History:  Procedure Laterality Date  . BOWEL RESECTION         Family History  Problem Relation Age of Onset  . Heart attack Mother   . Heart attack Father     Social History   Tobacco Use  . Smoking status: Former Smoker    Packs/day: 0.50    Years: 35.00    Pack years: 17.50    Types: Cigarettes    Start date: 20    Quit date: 03/18/2020    Years since quitting: 0.4  . Smokeless tobacco: Never Used  Vaping Use  . Vaping Use: Never used  Substance Use Topics  . Alcohol use: No  . Drug use: Yes     Types: Marijuana    Home Medications Prior to Admission medications   Medication Sig Start Date End Date Taking? Authorizing Provider  albuterol (PROVENTIL HFA;VENTOLIN HFA) 108 (90 Base) MCG/ACT inhaler Inhale 2 puffs into the lungs every 6 (six) hours as needed for wheezing or shortness of breath. 02/28/18   Diallo, Earna Coder, MD  amLODipine (NORVASC) 10 MG tablet TAKE ONE TABLET BY MOUTH DAILY 02/11/19   Diallo, Earna Coder, MD  celecoxib (CELEBREX) 200 MG capsule Take 200 mg by mouth daily.  07/25/19   [provider]  diclofenac sodium (VOLTAREN) 1 % GEL  07/02/19   [provider]  doxycycline (VIBRAMYCIN) 100 MG capsule Take 1 capsule (100 mg total) by mouth 2 (two) times daily for 7 days. 08/24/20 08/31/20  Eustaquio Maize, PA-C  Fluticasone-Salmeterol (ADVAIR) 250-50 MCG/DOSE AEPB Inhale 1 puff into the lungs 2 (two) times daily. 02/28/18   Diallo, Earna Coder, MD  gabapentin (NEURONTIN) 300 MG capsule Take 1 capsule (300 mg total) by mouth 3 (three) times daily. 06/08/17   Recardo Evangelist, PA-C  lidocaine (LIDODERM) 5 % Place 1 patch onto the skin daily. Remove & Discard  patch within 12 hours or as directed by MD 11/16/16   Randal Buba, April, MD  meloxicam (MOBIC) 15 MG tablet Take 1 tablet (15 mg total) by mouth daily. 06/08/17   Recardo Evangelist, PA-C  omeprazole (PRILOSEC) 40 MG capsule  07/25/19   [provider]  oxyCODONE-acetaminophen (PERCOCET/ROXICET) 5-325 MG tablet Take 1 tablet by mouth every 6 (six) hours as needed for severe pain. 08/24/20   Benjimen Kelley, PA-C  sucralfate (CARAFATE) 1 g tablet Take 1 g by mouth 4 (four) times daily. 07/25/19   [provider]    Allergies    Patient has no known allergies.  Review of Systems   Review of Systems  Constitutional: Negative for chills and fever.  Gastrointestinal: Negative for nausea and vomiting.  Genitourinary: Positive for scrotal swelling and testicular pain.  Skin:       + swelling,  redness, pain, and drainage to left testicle  All other systems reviewed and are negative.   Physical Exam Updated Vital Signs BP (!) 122/92 (BP Location: Right Arm)   Pulse 68   Temp 98.8 F (37.1 C) (Oral)   Resp 16   Ht 5\' 9"  (1.753 m)   Wt 96.6 kg   SpO2 98%   BMI 31.45 kg/m   Physical Exam Vitals and nursing note reviewed.  Constitutional:      Appearance: He is not ill-appearing.  HENT:     Head: Normocephalic and atraumatic.  Eyes:     Conjunctiva/sclera: Conjunctivae normal.  Cardiovascular:     Rate and Rhythm: Normal rate and regular rhythm.     Pulses: Normal pulses.  Pulmonary:     Effort: Pulmonary effort is normal.     Breath sounds: Normal breath sounds. No wheezing, rhonchi or rales.  Abdominal:     Palpations: Abdomen is soft.     Tenderness: There is no abdominal tenderness. There is no guarding or rebound.  Genitourinary:    Comments: Chaperone present for exam Esperanza Sheets, RN Uncircumcised penis without phimosis/paraphimosis, hypospadias, erythema, tenderness, or discharge. No rashes or lesions. Left testicle with edema and erythema; actively draining thick purulent material with TTP.  No abnormal lie. No inguinal hernias or adenopathy present. Musculoskeletal:     Cervical back: Neck supple.  Skin:    General: Skin is warm and dry.  Neurological:     Mental Status: He is alert.     ED Results / Procedures / Treatments   Labs (all labs ordered are listed, but only abnormal results are displayed) Labs Reviewed  BASIC METABOLIC PANEL - Abnormal; Notable for the following components:      Result Value   Glucose, Bld 106 (*)    BUN 21 (*)    All other components within normal limits  URINALYSIS, ROUTINE W REFLEX MICROSCOPIC - Abnormal; Notable for the following components:   Specific Gravity, Urine >1.046 (*)    All other components within normal limits  CBG MONITORING, ED - Abnormal; Notable for the following components:    Glucose-Capillary 100 (*)    All other components within normal limits  CBC WITH DIFFERENTIAL/PLATELET  LACTIC ACID, PLASMA  LACTIC ACID, PLASMA    EKG None  Radiology CT PELVIS W CONTRAST  Result Date: 08/24/2020 CLINICAL DATA:  59 year old with scrotal swelling and abscess. EXAM: CT PELVIS WITH CONTRAST TECHNIQUE: Multidetector CT imaging of the pelvis was performed using the standard protocol following the bolus administration of intravenous contrast. CONTRAST:  131mL OMNIPAQUE IOHEXOL 300 MG/ML  SOLN COMPARISON:  CT pelvis 09/20/2016 FINDINGS: Urinary Tract: Normal appearance of the urinary bladder. Visualized ureters are normal caliber. Bowel: Again noted are surgical changes involving the rectum and distal colon. No evidence for bowel inflammation. Vascular/Lymphatic: Mildly prominent inguinal lymph nodes bilaterally. Index left inguinal lymph node measures 1.4 cm in short axis on sequence 3, image 44 and this measured 1.5 cm in 2017. Lymph nodes in the pelvis have not significantly changed. Atherosclerotic disease involving the iliac arteries without aneurysm. Proximal femoral arteries are patent. Reproductive:  Stable appearance of the prostate. Other: Scrotum appears to be edematous and there is a poorly defined low-density structure along the left posterior aspect of the scrotum that measures roughly 2.5 x 1.4 x 2.7 cm. Findings could represent a small abscess. Small left inguinal hernia containing fat. Negative for free fluid in the pelvis. Focal soft tissue thickening along the left side of the anus may be a chronic finding. Postsurgical changes along the right lower anterior subcutaneous tissues with a surgical clip in this area. Musculoskeletal: Again noted is a low-density structure along the posterior aspect of the left upper thigh. This may be arising from the left sciatic nerve. This structure has a bilobed configuration measuring 6.6 x 2.9 x 3.4 cm. Small calcifications associated  with this lesion. Based on the location, this could represent a nerve sheath tumor. Slightly lobulated structures along the medial aspect of the left upper thigh subcutaneous tissues on sequence 3, image 53. These small low-density structures are nonspecific but could be the same process that is possibly involving the left sciatic nerve. IMPRESSION: 1. Poorly defined low-density area along the posterior left side of the scrotum. This could represent a small abscess collection or phlegmon. This area measures up to 2.7 cm as described. 2. Mild soft tissue thickening near the anus and this may be related to chronic changes. No evidence for acute inflammatory changes in this area. 3. **An incidental finding of potential clinical significance has been found. Bilobed low-density structure along the posterior left upper thigh. This structure may be arising from the sciatic nerve and could represent a large nerve sheath tumor. Recommend follow-up MRI of the left thigh, with and without contrast.** Electronically Signed   By: Markus Daft M.D.   On: 08/24/2020 15:25    Procedures .Marland KitchenIncision and Drainage  Date/Time: 08/24/2020 4:56 PM Performed by: Eustaquio Maize, PA-C Authorized by: Eustaquio Maize, PA-C   Consent:    Consent obtained:  Verbal   Consent given by:  Patient   Risks discussed:  Bleeding, pain, incomplete drainage and damage to other organs Location:    Type:  Abscess   Size:  3 x 3   Location:  Anogenital   Anogenital location:  Scrotal wall Pre-procedure details:    Skin preparation:  Betadine Anesthesia (see MAR for exact dosages):    Anesthesia method:  Local infiltration   Local anesthetic:  Lidocaine 1% w/o epi Procedure type:    Complexity:  Complex Procedure details:    Incision types:  Stab incision   Scalpel blade:  11   Wound management:  Probed and deloculated and irrigated with saline   Drainage:  Bloody and purulent   Drainage amount:  Scant   Packing materials:  1/4  in gauze Post-procedure details:    Patient tolerance of procedure:  Tolerated well, no immediate complications   (including critical care time)  Medications Ordered in ED Medications  iohexol (OMNIPAQUE) 300 MG/ML solution 100 mL (100 mLs Intravenous Contrast  Given 08/24/20 1452)  lidocaine (PF) (XYLOCAINE) 1 % injection 5 mL (5 mLs Infiltration Given by Other 08/24/20 1645)  HYDROmorphone (DILAUDID) injection 1 mg (1 mg Intravenous Given 08/24/20 1613)  doxycycline (VIBRA-TABS) tablet 100 mg (100 mg Oral Given 08/24/20 1702)    ED Course  I have reviewed the triage vital signs and the nursing notes.  Pertinent labs & imaging results that were available during my care of the patient were reviewed by me and considered in my medical decision making (see chart for details).    MDM Rules/Calculators/A&P                          59 year old male who presents to the ED today complaining of a left scrotal abscess that has been present for approximately 1 week.  Started as a small knot and has grown in size.  Started draining 4 days ago.  He has never had symptoms like this in the past.  He denies any history of diabetes.  Denies fevers or chills.  On arrival to the ED vitals are stable.  Patient is afebrile, nontachycardic nontachypneic and appears to be in no acute distress.  Chaperone was present for exam.  Left testicle significantly swollen with surrounding erythema and tenderness palpation, actively draining purulent material.  Given the extent of the abscess without previous history we will plan for CT scan to assess for depth.  Will obtain labs at this time as well.  We will plan to touch base with urology once CT scan returns.   CBC without leukocytosis. Hgb stable at 13.0 BMP with glucose 106 and BUN 21. Creatinine stable at 0.93.  Lactic acid 0.8 U/A without infection  CT scan: IMPRESSION:  1. Poorly defined low-density area along the posterior left side of  the scrotum. This  could represent a small abscess collection or  phlegmon. This area measures up to 2.7 cm as described.  2. Mild soft tissue thickening near the anus and this may be related  to chronic changes. No evidence for acute inflammatory changes in  this area.  3. **An incidental finding of potential clinical significance has  been found. Bilobed low-density structure along the posterior left  upper thigh. This structure may be arising from the sciatic nerve  and could represent a large nerve sheath tumor. Recommend follow-up  MRI of the left thigh, with and without contrast.**   Discussed case with Urologist Dr. Claudia Desanctis who recommends I&D in the ED with packing and close urology follow up. Will update patient on the incidental finding and have pt follow up with his PCP for MRI in the outpatient setting.   I&D performed with scant amount of bloody and purulent drainage. Abscess deloculated and irrigated with saline and 1/4 packing placed. Pt discharged home with doxycycline and close urology follow up. Have provided a dose of doxy in the ED tonight given his pharmacy is closing soon. Pt is in agreement with plan and stable for discharge home.   This note was prepared using Dragon voice recognition software and may include unintentional dictation errors due to the inherent limitations of voice recognition software.  Final Clinical Impression(s) / ED Diagnoses Final diagnoses:  Scrotal abscess  Abnormal finding on CT scan    Rx / DC Orders ED Discharge Orders         Ordered    doxycycline (VIBRAMYCIN) 100 MG capsule  2 times daily        08/24/20  1701    oxyCODONE-acetaminophen (PERCOCET/ROXICET) 5-325 MG tablet  Every 6 hours PRN        08/24/20 1704           Discharge Instructions     Please follow up with [facility] for recheck of your scrotal abscess. Call their office to schedule an appointment. They can remove your packing in 48 hours time or you can return to the ED in 48 hours  for packing removal.   Pick up antibiotics and take as prescribed. It is very important to finish the entire course.   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 It is recommended that you have an MRI of your femur done with and without contrast. Please follow up with your PCP regarding this as they can schedule it in the outpatient setting.   Return to the ED for any worsening symptoms       Eustaquio Maize, PA-C 08/24/20 1705    Gareth Morgan, MD 08/28/20 [phone]

## 2020-08-26 DIAGNOSIS — R1013 Epigastric pain: Secondary | ICD-10-CM | POA: Diagnosis not present

## 2020-08-26 DIAGNOSIS — I10 Essential (primary) hypertension: Secondary | ICD-10-CM | POA: Diagnosis not present

## 2020-08-26 DIAGNOSIS — E669 Obesity, unspecified: Secondary | ICD-10-CM | POA: Diagnosis not present

## 2020-08-26 DIAGNOSIS — J301 Allergic rhinitis due to pollen: Secondary | ICD-10-CM | POA: Diagnosis not present

## 2020-08-26 DIAGNOSIS — J4530 Mild persistent asthma, uncomplicated: Secondary | ICD-10-CM | POA: Diagnosis not present

## 2020-08-26 DIAGNOSIS — L2082 Flexural eczema: Secondary | ICD-10-CM | POA: Diagnosis not present

## 2020-08-26 DIAGNOSIS — Z720 Tobacco use: Secondary | ICD-10-CM | POA: Diagnosis not present

## 2020-08-27 DIAGNOSIS — J4530 Mild persistent asthma, uncomplicated: Secondary | ICD-10-CM | POA: Diagnosis not present

## 2020-08-31 ENCOUNTER — Other Ambulatory Visit: Payer: Self-pay | Admitting: Physician Assistant

## 2020-08-31 DIAGNOSIS — R9389 Abnormal findings on diagnostic imaging of other specified body structures: Secondary | ICD-10-CM

## 2020-09-08 DIAGNOSIS — J301 Allergic rhinitis due to pollen: Secondary | ICD-10-CM | POA: Diagnosis not present

## 2020-09-08 DIAGNOSIS — J4530 Mild persistent asthma, uncomplicated: Secondary | ICD-10-CM | POA: Diagnosis not present

## 2020-09-08 DIAGNOSIS — E669 Obesity, unspecified: Secondary | ICD-10-CM | POA: Diagnosis not present

## 2020-09-08 DIAGNOSIS — R21 Rash and other nonspecific skin eruption: Secondary | ICD-10-CM | POA: Diagnosis not present

## 2020-09-08 DIAGNOSIS — R1013 Epigastric pain: Secondary | ICD-10-CM | POA: Diagnosis not present

## 2020-09-08 DIAGNOSIS — L2082 Flexural eczema: Secondary | ICD-10-CM | POA: Diagnosis not present

## 2020-09-08 DIAGNOSIS — I10 Essential (primary) hypertension: Secondary | ICD-10-CM | POA: Diagnosis not present

## 2020-09-08 DIAGNOSIS — Z6841 Body Mass Index (BMI) 40.0 and over, adult: Secondary | ICD-10-CM | POA: Diagnosis not present

## 2020-09-08 DIAGNOSIS — Z720 Tobacco use: Secondary | ICD-10-CM | POA: Diagnosis not present

## 2020-09-15 DIAGNOSIS — J4540 Moderate persistent asthma, uncomplicated: Secondary | ICD-10-CM | POA: Diagnosis not present

## 2020-09-15 DIAGNOSIS — J3089 Other allergic rhinitis: Secondary | ICD-10-CM | POA: Diagnosis not present

## 2020-09-15 DIAGNOSIS — L299 Pruritus, unspecified: Secondary | ICD-10-CM | POA: Diagnosis not present

## 2020-09-15 DIAGNOSIS — L501 Idiopathic urticaria: Secondary | ICD-10-CM | POA: Diagnosis not present

## 2020-09-15 DIAGNOSIS — R21 Rash and other nonspecific skin eruption: Secondary | ICD-10-CM | POA: Diagnosis not present

## 2020-09-16 ENCOUNTER — Inpatient Hospital Stay: Admission: RE | Admit: 2020-09-16 | Payer: Medicare HMO | Source: Ambulatory Visit

## 2020-09-27 DIAGNOSIS — J4530 Mild persistent asthma, uncomplicated: Secondary | ICD-10-CM | POA: Diagnosis not present

## 2020-09-29 DIAGNOSIS — J4540 Moderate persistent asthma, uncomplicated: Secondary | ICD-10-CM | POA: Diagnosis not present

## 2020-09-29 DIAGNOSIS — K219 Gastro-esophageal reflux disease without esophagitis: Secondary | ICD-10-CM | POA: Diagnosis not present

## 2020-09-29 DIAGNOSIS — L299 Pruritus, unspecified: Secondary | ICD-10-CM | POA: Diagnosis not present

## 2020-09-29 DIAGNOSIS — R21 Rash and other nonspecific skin eruption: Secondary | ICD-10-CM | POA: Diagnosis not present

## 2020-10-01 DIAGNOSIS — G4700 Insomnia, unspecified: Secondary | ICD-10-CM | POA: Diagnosis not present

## 2020-10-01 DIAGNOSIS — J45909 Unspecified asthma, uncomplicated: Secondary | ICD-10-CM | POA: Diagnosis not present

## 2020-10-01 DIAGNOSIS — I10 Essential (primary) hypertension: Secondary | ICD-10-CM | POA: Diagnosis not present

## 2020-10-01 DIAGNOSIS — Z125 Encounter for screening for malignant neoplasm of prostate: Secondary | ICD-10-CM | POA: Diagnosis not present

## 2020-10-01 DIAGNOSIS — F1290 Cannabis use, unspecified, uncomplicated: Secondary | ICD-10-CM | POA: Diagnosis not present

## 2020-10-01 DIAGNOSIS — Z79899 Other long term (current) drug therapy: Secondary | ICD-10-CM | POA: Diagnosis not present

## 2020-10-01 DIAGNOSIS — F17211 Nicotine dependence, cigarettes, in remission: Secondary | ICD-10-CM | POA: Diagnosis not present

## 2020-10-01 DIAGNOSIS — E559 Vitamin D deficiency, unspecified: Secondary | ICD-10-CM | POA: Diagnosis not present

## 2020-10-01 DIAGNOSIS — E669 Obesity, unspecified: Secondary | ICD-10-CM | POA: Diagnosis not present

## 2020-10-14 ENCOUNTER — Other Ambulatory Visit: Payer: Medicare HMO

## 2020-10-14 DIAGNOSIS — G4733 Obstructive sleep apnea (adult) (pediatric): Secondary | ICD-10-CM | POA: Diagnosis not present

## 2020-12-08 ENCOUNTER — Ambulatory Visit
Admission: RE | Admit: 2020-12-08 | Discharge: 2020-12-08 | Disposition: A | Discharge status: Home / Self Care | Payer: Medicare Other | Source: Ambulatory Visit | Attending: Registered Nurse | Admitting: Registered Nurse

## 2020-12-08 ENCOUNTER — Other Ambulatory Visit: Payer: Self-pay | Admitting: Registered Nurse

## 2020-12-08 DIAGNOSIS — M5417 Radiculopathy, lumbosacral region: Secondary | ICD-10-CM

## 2020-12-08 DIAGNOSIS — M5410 Radiculopathy, site unspecified: Secondary | ICD-10-CM

## 2020-12-08 DIAGNOSIS — M25552 Pain in left hip: Secondary | ICD-10-CM

## 2021-07-01 ENCOUNTER — Other Ambulatory Visit: Payer: Self-pay

## 2021-07-01 ENCOUNTER — Ambulatory Visit (INDEPENDENT_AMBULATORY_CARE_PROVIDER_SITE_OTHER): Payer: Medicare Other | Admitting: Podiatry

## 2021-07-01 DIAGNOSIS — B351 Tinea unguium: Secondary | ICD-10-CM | POA: Diagnosis not present

## 2021-07-01 DIAGNOSIS — B353 Tinea pedis: Secondary | ICD-10-CM | POA: Diagnosis not present

## 2021-07-01 MED ORDER — CLOTRIMAZOLE-BETAMETHASONE 1-0.05 % EX CREA: 1.0000 "application " | TOPICAL_CREAM | Freq: Two times a day (BID) | CUTANEOUS | 0 refills | Status: DC

## 2021-07-01 NOTE — Patient Instructions (Signed)
I have ordered a medication for you that will come from [facility] in [place]. They should be calling you to verify insurance and will mail the medication to you. If you live close by then you can go by their pharmacy to pick up the medication. Their phone number is [phone]. If you do not hear from them in the next few days, please give [place] a call at [phone].   Clotrimazole; Betamethasone Lotion What is this medication? CLOTRIMAZOLE; BETAMETHASONE (kloe TRIM a zole; bay ta METH a sone) is a corticosteroid and antifungal lotion. It treats ringworm and infections like jock itch and athlete's foot. It also helps reduce swelling, redness, and itching caused by these infections. This medicine may be used for other purposes; ask your health care provider or pharmacist if you have questions. COMMON BRAND NAME(S): Lotrisone What should I tell my care team before I take this medication? They need to know if you have any of these conditions: large areas of burned or damaged skin skin thinning peripheral vascular disease or poor circulation an unusual or allergic reaction to clotrimazole, betamethasone, other medicines, foods, dyes, or preservatives pregnant or trying to get pregnant breast-feeding How should I use this medication? This lotion is for external use only. Do not take by mouth. Follow the directions on the prescription label. Wash your hands before and after use. If treating hand or nail infections, wash hands before use only. Apply a thin layer of lotion to the affected area and rub in gently. Do not cover or wrap the treated area with an airtight bandage (like a plastic bandage). Use the lotion for the full course of treatment prescribed, even if you think the condition is getting better. Use the medicine at regular intervals. Do not use more often than directed. Do not use on healthy skin or over large areas of skin. Do not use this medicine for any condition other than the  one for which it was prescribed. When applying to the groin area, apply a small amount and do not use for longer than 2 weeks unless directed to by your doctor or health care professional. Do not get this lotion in your eyes. If you do, rinse out with plenty of cool tap water. Talk to your pediatrician regarding the use of this medicine in children. While this drug may be prescribed for children as young as 61 years of age for selected conditions, precautions do apply. Patients over 30 years old may have a stronger reaction and need a smaller dose. Overdosage: If you think you have taken too much of this medicine contact a poison control center or emergency room at once. NOTE: This medicine is only for you. Do not share this medicine with others. What if I miss a dose? If you miss a dose, use it as soon as you can. If it is almost time for your next dose, use only that dose. Do not use double or take extra doses. What may interact with this medication? topical products that have nystatin This list may not describe all possible interactions. Give your health care provider a list of all the medicines, herbs, non-prescription drugs, or dietary supplements you use. Also tell them if you smoke, drink alcohol, or use illegal drugs. Some items may interact with your medicine. What should I watch for while using this medication? If using this medicine on your body or groin tell your doctor or health care professional if your symptoms do not improve within 1 week. If  using this medicine on your feet tell your doctor or health care professional if your symptoms do not improve within 2 weeks. Tell your doctor if your skin infection returns after you stop using this lotion. If you are using this lotion for 'jock itch' be sure to dry the groin completely after bathing. Do not wear underwear that is tight-fitting or made from synthetic fibers like rayon or nylon. Wear loose-fitting, cotton underwear. If you are using  this lotion for athlete's foot be sure to dry your feet carefully after bathing, especially between the toes. Do not wear socks made from wool or synthetic materials like rayon or nylon. Wear clean cotton socks and change them at least once a day, change them more if your feet sweat a lot. Also, try to wear sandals or shoes that are well-ventilated. Do not use this lotion to treat diaper rash. What side effects may I notice from receiving this medication? Side effects that you should report to your doctor or health care professional as soon as possible: allergic reactions like skin rash, itching or hives, swelling of the face, lips, or tongue dark red spots on the skin lack of healing of skin condition loss of feeling on skin painful, red, pus-filled blisters in hair follicles skin infection sores or blisters that do not heal properly thinning of the skin or sunburn Side effects that usually do not require medical attention (report to your doctor or health care professional if they continue or are bothersome): dry or peeling skin minor skin irritation, burning, or itching This list may not describe all possible side effects. Call your doctor for medical advice about side effects. You may report side effects to FDA at 1-800-FDA-1088. Where should I keep my medication? Keep out of the reach of children. Store at room temperature between 15 and 30 degrees C (59 and 86 degrees F). Do not freeze. Throw away any unused medicine after the expiration date. NOTE: This sheet is a summary. It may not cover all possible information. If you have questions about this medicine, talk to your doctor, pharmacist, or health care provider.  2022 Elsevier/Gold Standard (2019-07-30 14:48:46)

## 2021-07-06 NOTE — Progress Notes (Signed)
Subjective:   Patient ID: Benjamin Abbott, male   DOB: 60 y.o.   MRN: [id number]   HPI 60 year old male presents the office today for concerns of a skin pain on the bottom of his feet which is been ongoing last 2 to 3 months.  Is been using over-the-counter moisturizer.  He states that his doctor prescribed a cream but he is not sure what it is but it was for fungus.  Has been helping some.  Denies any open sores or any swelling or redness or any drainage or any pustules.   Review of Systems  All other systems reviewed and are negative.  Past Medical History:  Diagnosis Date   Asthma    Back pain    Chronic pain    Colon cancer ([facility])    Depression    Hypertension    Knee pain    Neuropathy    Obesity     Past Surgical History:  Procedure Laterality Date   BOWEL RESECTION       Current Outpatient Medications:    clotrimazole-betamethasone (LOTRISONE) cream, Apply 1 application topically 2 (two) times daily., Disp: 30 g, Rfl: 0   albuterol (PROVENTIL HFA;VENTOLIN HFA) 108 (90 Base) MCG/ACT inhaler, Inhale 2 puffs into the lungs every 6 (six) hours as needed for wheezing or shortness of breath., Disp: 18 g, Rfl: 1   amLODipine (NORVASC) 10 MG tablet, TAKE ONE TABLET BY MOUTH DAILY, Disp: 30 tablet, Rfl: 10   celecoxib (CELEBREX) 200 MG capsule, Take 200 mg by mouth daily. , Disp: , Rfl:    diclofenac sodium (VOLTAREN) 1 % GEL, , Disp: , Rfl:    Fluticasone-Salmeterol (ADVAIR) 250-50 MCG/DOSE AEPB, Inhale 1 puff into the lungs 2 (two) times daily., Disp: 14 each, Rfl: 1   gabapentin (NEURONTIN) 300 MG capsule, Take 1 capsule (300 mg total) by mouth 3 (three) times daily., Disp: 90 capsule, Rfl: 0   lidocaine (LIDODERM) 5 %, Place 1 patch onto the skin daily. Remove & Discard patch within 12 hours or as directed by MD, Disp: 10 patch, Rfl: 0   meloxicam (MOBIC) 15 MG tablet, Take 1 tablet (15 mg total) by mouth daily., Disp: 30 tablet, Rfl: 0   omeprazole (PRILOSEC) 40 MG capsule, ,  Disp: , Rfl:    oxyCODONE-acetaminophen (PERCOCET/ROXICET) 5-325 MG tablet, Take 1 tablet by mouth every 6 (six) hours as needed for severe pain., Disp: 10 tablet, Rfl: 0   sucralfate (CARAFATE) 1 g tablet, Take 1 g by mouth 4 (four) times daily., Disp: , Rfl:   No Known Allergies       Objective:  Physical Exam  General: AAO x3, NAD  Dermatological: Nails appear to be hypertrophic, dystrophic with yellow-brown discoloration.  No significant tenderness to the nails there is no redness or drainage or signs of infection.  Dry, peeling skin present plantar aspect of the foot without any skin openings, ulcerations or pustules.  Vascular: Dorsalis Pedis artery and Posterior Tibial artery pedal pulses are 2/4 bilateral with immedate capillary fill time. There is no pain with calf compression, swelling, warmth, erythema.   Neruologic: Grossly intact via light touch bilateral.   Musculoskeletal: No gross boney pedal deformities bilateral. No pain, crepitus, or limitation noted with foot and ankle range of motion bilateral. Muscular strength 5/5 in all groups tested bilateral.  Gait: Unassisted, Nonantalgic.       Assessment:   Onychomycosis, tinea pedis     Plan:  -Treatment options discussed including all alternatives, risks,  and complications -Etiology of symptoms were discussed -Discussed oral, topical as well as alternative treatments for nail fungus, athlete's foot.  He wants to hold off on oral medications.  Prescribed Lotrisone for the skin and I will also order compound cream today through [facility] for the nail fungus.  Trula Slade DPM

## 2022-05-02 NOTE — Patient Instructions (Signed)
DUE TO COVID-19 ONLY TWO VISITORS  (aged 59 and older)  ARE ALLOWED TO COME WITH YOU AND STAY IN THE WAITING ROOM ONLY DURING PRE OP AND PROCEDURE.   **NO VISITORS ARE ALLOWED IN THE SHORT STAY AREA OR RECOVERY ROOM!!**  IF YOU WILL BE ADMITTED INTO THE HOSPITAL YOU ARE ALLOWED ONLY FOUR SUPPORT PEOPLE DURING VISITATION HOURS ONLY (7 AM -8PM)   The support person(s) must pass our screening, gel in and out, and wear a mask at all times, including in the patient's room. Patients must also wear a mask when staff or their support person are in the room. Visitors GUEST BADGE MUST BE WORN VISIBLY  One adult visitor may remain with you overnight and MUST be in the room by 8 P.M.     Your procedure is scheduled on: 05/13/22   Report to [facility] Main Entrance    Report to admitting at : 12:30 PM   Call this number if you have problems the morning of surgery [phone]   Do not eat food :After Midnight.   After Midnight you may have the following liquids until : 11:30 AM DAY OF SURGERY  Water Black Coffee (sugar ok, NO MILK/CREAM OR CREAMERS)  Tea (sugar ok, NO MILK/CREAM OR CREAMERS) regular and decaf                             Plain Jell-O (NO RED)                                           Fruit ices (not with fruit pulp, NO RED)                                     Popsicles (NO RED)                                                                  Juice: apple, WHITE grape, WHITE cranberry Sports drinks like Gatorade (NO RED)   Drink  Ensure drink AT: 11:30 AM the day of surgery.     The day of surgery:  Drink ONE (1) Pre-Surgery Clear Ensure or G2 at AM the morning of surgery. Drink in one sitting. Do not sip.  This drink was given to you during your hospital  pre-op appointment visit. Nothing else to drink after completing the  Pre-Surgery Clear Ensure or G2.          If you have questions, please contact your surgeon's office    Oral Hygiene is also important to  reduce your risk of infection.                                    Remember - BRUSH YOUR TEETH THE MORNING OF SURGERY WITH YOUR REGULAR TOOTHPASTE   Do NOT smoke after Midnight   Take these medicines the morning of surgery with A SIP OF WATER: cetirizine,amlodipine,omeprazole,famotidine,Use inhalers and Flonase as usual.  DO  NOT TAKE ANY ORAL DIABETIC MEDICATIONS DAY OF YOUR SURGERY  Bring CPAP mask and tubing day of surgery.                              You may not have any metal on your body including hair pins, jewelry, and body piercing             Do not wear lotions, powders, perfumes/cologne, or deodorant              Men may shave face and neck.   Do not bring valuables to the hospital. [place].   Contacts, dentures or bridgework may not be worn into surgery.   Bring small overnight bag day of surgery.   DO NOT [place]. PHARMACY WILL DISPENSE MEDICATIONS LISTED ON YOUR MEDICATION LIST TO YOU DURING YOUR ADMISSION [place]!    Patients discharged on the day of surgery will not be allowed to drive home.  Someone NEEDS to stay with you for the first 24 hours after anesthesia.   Special Instructions: Bring a copy of your healthcare power of attorney and living will documents         the day of surgery if you haven't scanned them before.              Please read over the following fact sheets you were given: IF YOU HAVE QUESTIONS ABOUT YOUR PRE-OP INSTRUCTIONS PLEASE CALL [phone]     [facility] - Preparing for Surgery Before surgery, you can play an important role.  Because skin is not sterile, your skin needs to be as free of germs as possible.  You can reduce the number of germs on your skin by washing with CHG (chlorahexidine gluconate) soap before surgery.  CHG is an antiseptic cleaner which kills germs and bonds with the skin to continue killing germs even after  washing. Please DO NOT use if you have an allergy to CHG or antibacterial soaps.  If your skin becomes reddened/irritated stop using the CHG and inform your nurse when you arrive at Short Stay. Do not shave (including legs and underarms) for at least 48 hours prior to the first CHG shower.  You may shave your face/neck. Please follow these instructions carefully:  1.  Shower with CHG Soap the night before surgery and the  morning of Surgery.  2.  If you choose to wash your hair, wash your hair first as usual with your  normal  shampoo.  3.  After you shampoo, rinse your hair and body thoroughly to remove the  shampoo.                           4.  Use CHG as you would any other liquid soap.  You can apply chg directly  to the skin and wash                       Gently with a scrungie or clean washcloth.  5.  Apply the CHG Soap to your body ONLY FROM THE NECK DOWN.   Do not use on face/ open  Wound or open sores. Avoid contact with eyes, ears mouth and genitals (private parts).                       Wash face,  Genitals (private parts) with your normal soap.             6.  Wash thoroughly, paying special attention to the area where your surgery  will be performed.  7.  Thoroughly rinse your body with warm water from the neck down.  8.  DO NOT shower/wash with your normal soap after using and rinsing off  the CHG Soap.                9.  Pat yourself dry with a clean towel.            10.  Wear clean pajamas.            11.  Place clean sheets on your bed the night of your first shower and do not  sleep with pets. Day of Surgery : Do not apply any lotions/deodorants the morning of surgery.  Please wear clean clothes to the hospital/surgery center.  FAILURE TO FOLLOW THESE INSTRUCTIONS MAY RESULT IN THE CANCELLATION OF YOUR SURGERY PATIENT SIGNATURE_________________________________  NURSE  SIGNATURE__________________________________  ________________________________________________________________________

## 2022-05-03 ENCOUNTER — Other Ambulatory Visit: Payer: Self-pay

## 2022-05-03 ENCOUNTER — Encounter (HOSPITAL_COMMUNITY): Payer: Self-pay

## 2022-05-03 ENCOUNTER — Encounter (HOSPITAL_COMMUNITY)
Admission: RE | Admit: 2022-05-03 | Discharge: 2022-05-03 | Disposition: A | Discharge status: Home / Self Care | Payer: Medicare Other | Source: Ambulatory Visit | Attending: Orthopedic Surgery | Admitting: Orthopedic Surgery

## 2022-05-03 VITALS — BP 143/103 | HR 52 | Temp 98.2°F | Ht 69.0 in | Wt 209.0 lb

## 2022-05-03 DIAGNOSIS — I10 Essential (primary) hypertension: Secondary | ICD-10-CM | POA: Diagnosis not present

## 2022-05-03 DIAGNOSIS — Z01818 Encounter for other preprocedural examination: Secondary | ICD-10-CM | POA: Insufficient documentation

## 2022-05-03 HISTORY — DX: Unspecified osteoarthritis, unspecified site: M19.90

## 2022-05-03 LAB — CBC
HCT: 43 % (ref 39.0–52.0)
Hemoglobin: 14.3 g/dL (ref 13.0–17.0)
MCH: 28.6 pg (ref 26.0–34.0)
MCHC: 33.3 g/dL (ref 30.0–36.0)
MCV: 86 fL (ref 80.0–100.0)
Platelets: 251 10*3/uL (ref 150–400)
RBC: 5 MIL/uL (ref 4.22–5.81)
RDW: 14.6 % (ref 11.5–15.5)
WBC: 8.9 10*3/uL (ref 4.0–10.5)
nRBC: 0 % (ref 0.0–0.2)

## 2022-05-03 LAB — BASIC METABOLIC PANEL
Anion gap: 8 (ref 5–15)
BUN: 19 mg/dL (ref 6–20)
CO2: 21 mmol/L — ABNORMAL LOW (ref 22–32)
Calcium: 9.6 mg/dL (ref 8.9–10.3)
Chloride: 109 mmol/L (ref 98–111)
Creatinine, Ser: 0.98 mg/dL (ref 0.61–1.24)
GFR, Estimated: >60 mL/min (ref >60)
Glucose, Bld: 104 mg/dL — ABNORMAL HIGH (ref 70–99)
Potassium: 4.1 mmol/L (ref 3.5–5.1)
Sodium: 138 mmol/L (ref 135–145)

## 2022-05-03 NOTE — Progress Notes (Signed)
For Short Stay: [place] appointment date: Date of COVID positive in last 23 days:  Bowel Prep reminder:   For Anesthesia: PCP - Arthur Holms: NP Cardiologist - N/A  Chest x-ray -  EKG -  Stress Test -  ECHO -  Cardiac Cath -  Pacemaker/ICD device last checked: Pacemaker orders received: Device Rep notified:  Spinal Cord Stimulator:  Sleep Study - Yes CPAP - Yes  Fasting Blood Sugar -  Checks Blood Sugar _____ times a day Date and result of last Hgb A1c-  Blood Thinner Instructions: Aspirin Instructions: Last Dose:  Activity level: Can go up a flight of stairs and activities of daily living without stopping and without chest pain and/or shortness of breath   Able to exercise without chest pain and/or shortness of breath   Unable to go up a flight of stairs without chest pain and/or shortness of breath     Anesthesia review: Hx: HTN,Smoker,OSA(CPAP). BP was elevated at PAT appointment: 145/101, 143/103. Pt. Did not take BP medicine before the appointment,he said he has an appointment with PCP on 05/05/22.RN advised pt. To check BP at home for the next 2 days,and to bring that information to his PCP appointment.  Patient denies shortness of breath, fever, cough and chest pain at PAT appointment   Patient verbalized understanding of instructions that were given to them at the PAT appointment. Patient was also instructed that they will need to review over the PAT instructions again at home before surgery.

## 2022-05-05 NOTE — Progress Notes (Signed)
Anesthesia Chart Review:   Case: [id number] Date/Time: 05/13/22 1430   Procedure: KNEE ARTHROSCOPY WITH PARTIAL  LATERAL MENISECTOMY (Left: Knee)   Anesthesia type: Choice   Pre-op diagnosis: Left knee lateral meniscal tear   Location: [facility] ROOM 07 / [facility]   Surgeons: Nicholes Stairs, MD       DISCUSSION: Pt is 61 years old with hx HTN, OSA, asthma, colon cancer  BP elevated at pre-surgical testing - pt had not taken amlodipine yet that day. Pt to check BP at home for the next 2 days,and to bring that information to his PCP appointment scheduled 05/05/22  Reviewed EKG with Dr. Glennon Mac. Pt will need cardiology eval prior to surgery. I left a voicemail with this info for [facility] in Dr. Stann Mainland' office.   VS: BP (!) 143/103   Pulse (!) 52   Temp 36.8 C (Oral)   Ht '5\' 9"'$  (1.753 m)   Wt 94.8 kg   SpO2 96%   BMI 30.86 kg/m   PROVIDERS: - PCP is Arthur Holms, NP   LABS: Labs reviewed: Acceptable for surgery. (all labs ordered are listed, but only abnormal results are displayed)  Labs Reviewed  BASIC METABOLIC PANEL - Abnormal; Notable for the following components:      Result Value   CO2 21 (*)    Glucose, Bld 104 (*)    All other components within normal limits  CBC     EKG 05/03/22:  Sinus bradycardia Possible Left atrial enlargement Left anterior fascicular block T wave abnormality, consider lateral ischemia When compared with ECG of 03-Mar-2016 13:20, T wave abnormality is more prominent  CV: N/A  Past Medical History:  Diagnosis Date   Arthritis    Asthma    Back pain    Chronic pain    Colon cancer (HCC)    Depression    Hypertension    Knee pain    Neuropathy    Obesity     Past Surgical History:  Procedure Laterality Date   ARTHROSCOPY WITH ANTERIOR CRUCIATE LIGAMENT (ACL) REPAIR WITH ANTERIOR TIBILIAS GRAFT Right    BOWEL RESECTION      MEDICATIONS:  albuterol (PROVENTIL HFA;VENTOLIN HFA) 108 (90 Base) MCG/ACT inhaler   albuterol  (PROVENTIL) (2.5 MG/3ML) 0.083% nebulizer solution   amLODipine (NORVASC) 10 MG tablet   celecoxib (CELEBREX) 200 MG capsule   cetirizine (ZYRTEC) 10 MG tablet   Cholecalciferol (VITAMIN D) 50 MCG (2000 UT) tablet   clotrimazole-betamethasone (LOTRISONE) cream   cyclobenzaprine (FLEXERIL) 10 MG tablet   famotidine (PEPCID) 20 MG tablet   fluticasone (FLONASE) 50 MCG/ACT nasal spray   Fluticasone-Salmeterol (ADVAIR) 250-50 MCG/DOSE AEPB   gabapentin (NEURONTIN) 300 MG capsule   hydrOXYzine (ATARAX) 25 MG tablet   lidocaine (LIDODERM) 5 %   meloxicam (MOBIC) 15 MG tablet   montelukast (SINGULAIR) 10 MG tablet   omeprazole (PRILOSEC) 40 MG capsule   ondansetron (ZOFRAN) 4 MG tablet   oxyCODONE-acetaminophen (PERCOCET/ROXICET) 5-325 MG tablet   triamcinolone cream (KENALOG) 0.1 %   No current facility-administered medications for this encounter.    Willeen Cass, PhD, FNP-BC [facility]/Anesthesiology Phone: [phone] 05/05/2022 2:06 PM

## 2022-05-11 ENCOUNTER — Telehealth: Payer: Self-pay | Admitting: *Deleted

## 2022-05-11 NOTE — Telephone Encounter (Signed)
   Pre-operative Risk Assessment    Patient Name: Benjamin Abbott  DOB: Nov 13, 1960 MRN: [id number]    PT HAS A NEW PT APPT WITH DR. [facility] BRANCH 05/16/22. ONCE THE PT HAS BEEN CLEARED ; DR. BRANCH WILL HAVE HER NURSE/CMA FAX OVER CLEARANCE ALONG WITH ANY RECOMMENDATIONS  Request for Surgical Clearance    Procedure:   LEFT KNEE SCOPE WITH PLM  Date of Surgery:  Clearance 06/03/22                                 Surgeon:  DR. Victorino December Surgeon's Group or Practice Name:  [facility] Phone number:  [phone] Fax number:  [phone] ATTN: KERRI MAZE   Type of Clearance Requested:   - Medical ; NO MEDICATIONS ARE LISTED AS NEEDING TO BE HELD   Type of Anesthesia:   CHOICE   Additional requests/questions:    Jiles Prows   05/11/2022, 9:16 AM

## 2022-05-16 ENCOUNTER — Encounter: Payer: Self-pay | Admitting: Internal Medicine

## 2022-05-16 ENCOUNTER — Ambulatory Visit (INDEPENDENT_AMBULATORY_CARE_PROVIDER_SITE_OTHER): Payer: Medicare Other | Admitting: Internal Medicine

## 2022-05-16 VITALS — BP 148/106 | HR 61 | Ht 69.0 in | Wt 213.6 lb

## 2022-05-16 DIAGNOSIS — R9431 Abnormal electrocardiogram [ECG] [EKG]: Secondary | ICD-10-CM

## 2022-05-16 MED ORDER — LOSARTAN POTASSIUM 25 MG PO TABS: 25.0000 mg | ORAL_TABLET | Freq: Every day | ORAL | 3 refills | Status: DC

## 2022-05-16 NOTE — Progress Notes (Signed)
Anesthesia Review:  PCP: Cardiologist : Chest x-ray : EKG : Echo : Stress test: Cardiac Cath :  Activity level:  Sleep Study/ CPAP : Fasting Blood Sugar :      / Checks Blood Sugar -- times a day:   Blood Thinner/ Instructions /Last Dose: ASA / Instructions/ Last Dose :  

## 2022-05-16 NOTE — Progress Notes (Signed)
Cardiology Office Note:    Date:  05/16/2022   ID:  Benjamin Abbott, DOB 1961/07/29, MRN [id number]  PCP:  Arthur Holms, NP   [place] [facility] Cardiologist:  Janina Mayo, MD     Referring MD: Arthur Holms, NP   No chief complaint on file. Pre-OP  History of Present Illness:    Benjamin Abbott is a 61 y.o. male with a hx of asthma, arthritis, planned for knee arthroscopy , patient is referred to cardiology for sinus bradycardia HR 54 bpm, LAFB, infero-lateral TWI. Last EKG 2017, it was not seen at this time. He has no hx of known CAD. He has had no stress tests or echo. No LHC. Today, he is asymptomatic. He can walk up a flight of stairs. No significant DOE or chest pressure. He quit smoking 3 months ago, smoked 15-16 years.   Past Medical History:  Diagnosis Date   Arthritis    Asthma    Back pain    Chronic pain    Colon cancer (HCC)    Depression    Hypertension    Knee pain    Neuropathy    Obesity     Past Surgical History:  Procedure Laterality Date   ARTHROSCOPY WITH ANTERIOR CRUCIATE LIGAMENT (ACL) REPAIR WITH ANTERIOR TIBILIAS GRAFT Right    BOWEL RESECTION      Current Medications: Current Meds  Medication Sig   albuterol (PROVENTIL HFA;VENTOLIN HFA) 108 (90 Base) MCG/ACT inhaler Inhale 2 puffs into the lungs every 6 (six) hours as needed for wheezing or shortness of breath.   albuterol (PROVENTIL) (2.5 MG/3ML) 0.083% nebulizer solution Take 2.5 mg by nebulization every 6 (six) hours as needed for wheezing or shortness of breath.   amLODipine (NORVASC) 10 MG tablet TAKE ONE TABLET BY MOUTH DAILY   celecoxib (CELEBREX) 200 MG capsule Take 200 mg by mouth 2 (two) times daily.   cetirizine (ZYRTEC) 10 MG tablet Take 10 mg by mouth daily.   Cholecalciferol (VITAMIN D) 50 MCG (2000 UT) tablet Take 2,000 Units by mouth daily.   clotrimazole-betamethasone (LOTRISONE) cream Apply 1 application topically 2 (two) times daily.   cyclobenzaprine (FLEXERIL)  10 MG tablet Take 10 mg by mouth at bedtime as needed for muscle spasms.   famotidine (PEPCID) 20 MG tablet Take 20 mg by mouth 2 (two) times daily.   fluticasone (FLONASE) 50 MCG/ACT nasal spray Place 1 spray into both nostrils daily as needed for allergies.   Fluticasone-Salmeterol (ADVAIR) 250-50 MCG/DOSE AEPB Inhale 1 puff into the lungs 2 (two) times daily.   gabapentin (NEURONTIN) 300 MG capsule Take 1 capsule (300 mg total) by mouth 3 (three) times daily.   hydrOXYzine (ATARAX) 25 MG tablet Take 25 mg by mouth at bedtime.   lidocaine (LIDODERM) 5 % Place 1 patch onto the skin daily. Remove & Discard patch within 12 hours or as directed by MD (Patient taking differently: Place 1 patch onto the skin daily as needed (pain). Remove & Discard patch within 12 hours or as directed by MD)   losartan (COZAAR) 25 MG tablet Take 1 tablet (25 mg total) by mouth daily.   meloxicam (MOBIC) 15 MG tablet Take 1 tablet (15 mg total) by mouth daily.   montelukast (SINGULAIR) 10 MG tablet Take 10 mg by mouth daily as needed (allergies).   omeprazole (PRILOSEC) 40 MG capsule Take 40 mg by mouth 2 (two) times daily.   ondansetron (ZOFRAN) 4 MG tablet Take 4 mg by mouth 2 (  two) times daily as needed for nausea/vomiting.   oxyCODONE-acetaminophen (PERCOCET/ROXICET) 5-325 MG tablet Take 1 tablet by mouth every 6 (six) hours as needed for severe pain.   triamcinolone cream (KENALOG) 0.1 % Apply 1 Application topically 2 (two) times daily.     Allergies:   Patient has no known allergies.   Social History   Socioeconomic History   Marital status: Single    Spouse name: Not on file   Number of children: Not on file   Years of education: Not on file   Highest education level: Not on file  Occupational History   Not on file  Tobacco Use   Smoking status: Former    Packs/day: 0.50    Years: 35.00    Total pack years: 17.50    Types: Cigarettes    Start date: 32    Quit date: 03/18/2020    Years since  quitting: 2.1   Smokeless tobacco: Never  Vaping Use   Vaping Use: Never used  Substance and Sexual Activity   Alcohol use: Yes    Alcohol/week: 3.0 standard drinks of alcohol    Types: 3 Cans of beer per week    Comment: weekend   Drug use: Yes    Types: Marijuana   Sexual activity: Yes  Other Topics Concern   Not on file  Social History Narrative   Not on file   Social Determinants of Health   Financial Resource Strain: Not on file  Food Insecurity: Not on file  Transportation Needs: Not on file  Physical Activity: Not on file  Stress: Not on file  Social Connections: Not on file     Family History: No premature CAD  ROS:   Please see the history of present illness.     All other systems reviewed and are negative.  EKGs/Labs/Other Studies Reviewed:    The following studies were reviewed today:   EKG:  EKG is  ordered today.  The ekg ordered today demonstrates   05/16/2022- NSR, aberrant PACs, no significant TW changes , non specific   Recent Labs: 05/03/2022: BUN 19; Creatinine, Ser 0.98; Hemoglobin 14.3; Platelets 251; Potassium 4.1; Sodium 138   Recent Lipid Panel    Component Value Date/Time   CHOL 162 06/08/2017 1115   TRIG 80 06/08/2017 1115   HDL 54 06/08/2017 1115   CHOLHDL 3.0 06/08/2017 1115   LDLCALC 92 06/08/2017 1115     Risk Assessment/Calculations:            Physical Exam:    VS:  Vitals:   05/16/22 0833 05/16/22 0836  BP: (!) 144/104 (!) 148/106  Pulse: 61   SpO2: 99%      Wt Readings from Last 3 Encounters:  05/16/22 213 lb 9.6 oz (96.9 kg)  05/03/22 209 lb (94.8 kg)  08/24/20 213 lb (96.6 kg)     GEN:  Well nourished, well developed in no acute distress HEENT: Normal NECK: No JVD; No carotid bruits LYMPHATICS: No lymphadenopathy CARDIAC: RRR, no murmurs, rubs, gallops RESPIRATORY:  Clear to auscultation without rales, wheezing or rhonchi  ABDOMEN: Soft, non-tender, non-distended MUSCULOSKELETAL:  No edema; No  deformity  SKIN: Warm and dry NEUROLOGIC:  Alert and oriented x 3 PSYCHIATRIC:  Normal affect   ASSESSMENT:    Pre-Op Risk Assessment: He is asymptomatic. He can do > 4 METS. EKG is benign. His procedure is low risk. He is acceptable cardiac risk for sedation/general anesthesia for low risk knee arthroscopy.  HTN: Elevated. recommended  ambulatory monitoring. Added losartan 25 mg daily. Continue norvasc 10 mg daily. BP goal <130/80 mmHg  CVD risk: quit smoking. Recommend ongoing diet and physical activity counseling. PLAN:    In order of problems listed above:  He is acceptable cardiac risk for sedation/general anesthesia for  low risk knee arthroscopy. Start losartan 25 mg daily; can be managed and uptitrated by his primary provider No further cardiac w/u or follow up indicated at this time           Medication Adjustments/Labs and Tests Ordered: Current medicines are reviewed at length with the patient today.  Concerns regarding medicines are outlined above.  Orders Placed This Encounter  Procedures   EKG 12-Lead   Meds ordered this encounter  Medications   losartan (COZAAR) 25 MG tablet    Sig: Take 1 tablet (25 mg total) by mouth daily.    Dispense:  90 tablet    Refill:  3    Patient Instructions  Medication Instructions:  START Losartan 25 mg daily  *If you need a refill on your cardiac medications before your next appointment, please call your pharmacy*  Lab Work: NONE ordered at this time of appointment   If you have labs (blood work) drawn today and your tests are completely normal, you will receive your results only by: [place] (if you have MyChart) OR A paper copy in the mail If you have any lab test that is abnormal or we need to change your treatment, we will call you to review the results.  Testing/Procedures: NONE ordered at this time of appointment   Follow-Up: At [facility], you and your health needs are our priority.  As part of our  continuing mission to provide you with exceptional heart care, we have created designated Provider Care Teams.  These Care Teams include your primary Cardiologist (physician) and Advanced Practice Providers (APPs -  Physician Assistants and Nurse Practitioners) who all work together to provide you with the care you need, when you need it.  Your next appointment:   As needed if symptoms worsen, new or fail to improve   The format for your next appointment:   In Person  Provider:   Janina Mayo, MD  or APP   Other Instructions   Important Information About Sugar         Signed, Janina Mayo, MD  05/16/2022 8:53 AM    [place]

## 2022-05-16 NOTE — Patient Instructions (Signed)
Medication Instructions:  START Losartan 25 mg daily  *If you need a refill on your cardiac medications before your next appointment, please call your pharmacy*  Lab Work: NONE ordered at this time of appointment   If you have labs (blood work) drawn today and your tests are completely normal, you will receive your results only by: [place] (if you have MyChart) OR A paper copy in the mail If you have any lab test that is abnormal or we need to change your treatment, we will call you to review the results.  Testing/Procedures: NONE ordered at this time of appointment   Follow-Up: At [facility], you and your health needs are our priority.  As part of our continuing mission to provide you with exceptional heart care, we have created designated Provider Care Teams.  These Care Teams include your primary Cardiologist (physician) and Advanced Practice Providers (APPs -  Physician Assistants and Nurse Practitioners) who all work together to provide you with the care you need, when you need it.  Your next appointment:   As needed if symptoms worsen, new or fail to improve   The format for your next appointment:   In Person  Provider:   Janina Mayo, MD  or APP   Other Instructions   Important Information About Sugar

## 2022-05-16 NOTE — Patient Instructions (Signed)
SURGICAL WAITING ROOM VISITATION Patients having surgery or a procedure may have no more than 2 support people in the waiting area - these visitors may rotate.   Children under the age of 43 must have an adult with them who is not the patient. If the patient needs to stay at the hospital during part of their recovery, the visitor guidelines for inpatient rooms apply. Pre-op nurse will coordinate an appropriate time for 1 support person to accompany patient in pre-op.  This support person may not rotate.    Please refer to the [facility] website for the visitor guidelines for Inpatients (after your surgery is over and you are in a regular room).       Your procedure is scheduled on:  06/03/2022    Report to [facility] Main Entrance    Report to admitting at  1115 AM   Call this number if you have problems the morning of surgery [phone]   Do not eat food :After Midnight.   After Midnight you may have the following liquids until __ 1015____ AM  DAY OF SURGERY  Water Non-Citrus Juices (without pulp, NO RED) Carbonated Beverages Black Coffee (NO MILK/CREAM OR CREAMERS, sugar ok)  Clear Tea (NO MILK/CREAM OR CREAMERS, sugar ok) regular and decaf                             Plain Jell-O (NO RED)                                           Fruit ices (not with fruit pulp, NO RED)                                     Popsicles (NO RED)                                                               Sports drinks like Gatorade (NO RED)                    The day of surgery:  Drink ONE (1) Pre-Surgery Clear Ensure or G2 at 1015 AM ( have completed by )  the morning of surgery. Drink in one sitting. Do not sip.  This drink was given to you during your hospital  pre-op appointment visit. Nothing else to drink after completing the  Pre-Surgery Clear Ensure or G2.           Oral Hygiene is also important to reduce your risk of infection.                                     Remember - BRUSH YOUR TEETH THE MORNING OF SURGERY WITH YOUR REGULAR TOOTHPASTE   Do NOT smoke after Midnight   Take these medicines the morning of surgery with A SIP OF WATER:  inhalers as usual and brin,g nebulizer if needed, amlodipine, omeprazole, zyrtec, pepcid, gabapentin   DO NOT TAKE ANY ORAL  DIABETIC MEDICATIONS DAY OF YOUR SURGERY  Bring CPAP mask and tubing day of surgery.                              You may not have any metal on your body including hair pins, jewelry, and body piercing             Do not wear make-up, lotions, powders, perfumes/cologne, or deodorant  Do not wear nail polish including gel and S&S, artificial/acrylic nails, or any other type of covering on natural nails including finger and toenails. If you have artificial nails, gel coating, etc. that needs to be removed by a nail salon please have this removed prior to surgery or surgery may need to be canceled/ delayed if the surgeon/ anesthesia feels like they are unable to be safely monitored.   Do not shave  48 hours prior to surgery.               Men may shave face and neck.   Do not bring valuables to the hospital. [place].   Contacts, dentures or bridgework may not be worn into surgery.   Bring small overnight bag day of surgery.   DO NOT [place]. PHARMACY WILL DISPENSE MEDICATIONS LISTED ON YOUR MEDICATION LIST TO YOU DURING YOUR ADMISSION [place]!    Patients discharged on the day of surgery will not be allowed to drive home.  Someone NEEDS to stay with you for the first 24 hours after anesthesia.   Special Instructions: Bring a copy of your healthcare power of attorney and living will documents         the day of surgery if you haven't scanned them before.              Please read over the following fact sheets you were given: IF YOU HAVE QUESTIONS ABOUT YOUR PRE-OP INSTRUCTIONS PLEASE CALL  [phone]     [facility] - Preparing for Surgery Before surgery, you can play an important role.  Because skin is not sterile, your skin needs to be as free of germs as possible.  You can reduce the number of germs on your skin by washing with CHG (chlorahexidine gluconate) soap before surgery.  CHG is an antiseptic cleaner which kills germs and bonds with the skin to continue killing germs even after washing. Please DO NOT use if you have an allergy to CHG or antibacterial soaps.  If your skin becomes reddened/irritated stop using the CHG and inform your nurse when you arrive at Short Stay. Do not shave (including legs and underarms) for at least 48 hours prior to the first CHG shower.  You may shave your face/neck. Please follow these instructions carefully:  1.  Shower with CHG Soap the night before surgery and the  morning of Surgery.  2.  If you choose to wash your hair, wash your hair first as usual with your  normal  shampoo.  3.  After you shampoo, rinse your hair and body thoroughly to remove the  shampoo.                           4.  Use CHG as you would any other liquid soap.  You can apply chg directly  to the skin and wash                       Gently with a scrungie or clean washcloth.  5.  Apply the CHG Soap to your body ONLY FROM THE NECK DOWN.   Do not use on face/ open                           Wound or open sores. Avoid contact with eyes, ears mouth and genitals (private parts).                       Wash face,  Genitals (private parts) with your normal soap.             6.  Wash thoroughly, paying special attention to the area where your surgery  will be performed.  7.  Thoroughly rinse your body with warm water from the neck down.  8.  DO NOT shower/wash with your normal soap after using and rinsing off  the CHG Soap.                9.  Pat yourself dry with a clean towel.            10.  Wear clean pajamas.            11.  Place clean sheets on your bed the night of your  first shower and do not  sleep with pets. Day of Surgery : Do not apply any lotions/deodorants the morning of surgery.  Please wear clean clothes to the hospital/surgery center.  FAILURE TO FOLLOW THESE INSTRUCTIONS MAY RESULT IN THE CANCELLATION OF YOUR SURGERY PATIENT SIGNATURE_________________________________  NURSE SIGNATURE__________________________________  ________________________________________________________________________

## 2022-05-18 ENCOUNTER — Encounter (HOSPITAL_COMMUNITY): Payer: Self-pay

## 2022-05-18 ENCOUNTER — Other Ambulatory Visit: Payer: Self-pay

## 2022-05-18 ENCOUNTER — Encounter (HOSPITAL_COMMUNITY)
Admission: RE | Admit: 2022-05-18 | Discharge: 2022-05-18 | Disposition: A | Discharge status: Home / Self Care | Payer: Medicare Other | Source: Ambulatory Visit | Attending: Orthopedic Surgery | Admitting: Orthopedic Surgery

## 2022-05-18 VITALS — BP 150/107 | HR 72 | Temp 98.4°F | Resp 17 | Ht 69.0 in | Wt 213.6 lb

## 2022-05-18 DIAGNOSIS — Z01812 Encounter for preprocedural laboratory examination: Secondary | ICD-10-CM | POA: Insufficient documentation

## 2022-05-18 DIAGNOSIS — Z01818 Encounter for other preprocedural examination: Secondary | ICD-10-CM

## 2022-05-18 DIAGNOSIS — I10 Essential (primary) hypertension: Secondary | ICD-10-CM | POA: Insufficient documentation

## 2022-05-18 HISTORY — DX: Dyspnea, unspecified: R06.00

## 2022-05-18 HISTORY — DX: Gastro-esophageal reflux disease without esophagitis: K21.9

## 2022-05-18 HISTORY — DX: Chronic obstructive pulmonary disease, unspecified: J44.9

## 2022-05-18 HISTORY — DX: Sleep apnea, unspecified: G47.30

## 2022-05-18 LAB — BASIC METABOLIC PANEL
Anion gap: 7 (ref 5–15)
BUN: 13 mg/dL (ref 6–20)
CO2: 23 mmol/L (ref 22–32)
Calcium: 9.4 mg/dL (ref 8.9–10.3)
Chloride: 107 mmol/L (ref 98–111)
Creatinine, Ser: 0.82 mg/dL (ref 0.61–1.24)
GFR, Estimated: >60 mL/min (ref >60)
Glucose, Bld: 103 mg/dL — ABNORMAL HIGH (ref 70–99)
Potassium: 3.9 mmol/L (ref 3.5–5.1)
Sodium: 137 mmol/L (ref 135–145)

## 2022-05-18 LAB — CBC
HCT: 41.4 % (ref 39.0–52.0)
Hemoglobin: 13.7 g/dL (ref 13.0–17.0)
MCH: 28.5 pg (ref 26.0–34.0)
MCHC: 33.1 g/dL (ref 30.0–36.0)
MCV: 86.3 fL (ref 80.0–100.0)
Platelets: 220 10*3/uL (ref 150–400)
RBC: 4.8 MIL/uL (ref 4.22–5.81)
RDW: 14.6 % (ref 11.5–15.5)
WBC: 7.5 10*3/uL (ref 4.0–10.5)
nRBC: 0 % (ref 0.0–0.2)

## 2022-05-20 NOTE — Progress Notes (Addendum)
Anesthesia Chart Review   Case: [id number] Date/Time: 06/03/22 1300   Procedure: KNEE ARTHROSCOPY WITH PARTIAL  LATERAL MENISECTOMY (Left: Knee)   Anesthesia type: Choice   Pre-op diagnosis: Left knee lateral meniscal tear   Location: [facility] ROOM 08 / [facility]   Surgeons: Nicholes Stairs, MD       DISCUSSION:61 y.o. former smoker with h/o HTN, sleep apnea, COPD, asthma, left knee lateral meniscal tear scheduled for above procedure 06/03/2022 with Dr. Victorino December.    Pt seen by cardiology 05/16/2022 for preoperative evaluation.  Per OV note, "Pre-Op Risk Assessment: He is asymptomatic. He can do > 4 METS. EKG is benign. His procedure is low risk. He is acceptable cardiac risk for sedation/general anesthesia for low risk knee arthroscopy.   HTN: Elevated. recommended ambulatory monitoring. Added losartan 25 mg daily. Continue norvasc 10 mg daily. BP goal <130/80 mmHg"  Elevated BP at PAT visit.  He reports he had not picked up his new prescription yet.  Discussed importance of starting new prescription and risk of cancellation DOS.  Anticipate pt can proceed with planned procedure barring acute status change and after evaluation of BP DOS.  VS: BP (!) 150/107   Pulse 72   Temp 36.9 C (Oral)   Resp 17   Ht '5\' 9"'$  (1.753 m)   Wt 96.9 kg   SpO2 100%   BMI 31.54 kg/m   PROVIDERS: Arthur Holms, NP is PCP   Phineas Inches, MD is Cardiologist  LABS: Labs reviewed: Acceptable for surgery. (all labs ordered are listed, but only abnormal results are displayed)  Labs Reviewed  BASIC METABOLIC PANEL - Abnormal; Notable for the following components:      Result Value   Glucose, Bld 103 (*)    All other components within normal limits  CBC     IMAGES:   EKG:   CV:  Past Medical History:  Diagnosis Date   Arthritis    Asthma    Back pain    Chronic pain    Colon cancer ([place])    COPD (chronic obstructive pulmonary disease) (HCC)    Dyspnea    with exertion   GERD  (gastroesophageal reflux disease)    Hypertension    Knee pain    Neuropathy    Obesity    Sleep apnea    cpap    Past Surgical History:  Procedure Laterality Date   ARTHROSCOPY WITH ANTERIOR CRUCIATE LIGAMENT (ACL) REPAIR WITH ANTERIOR TIBILIAS GRAFT Right    BOWEL RESECTION      MEDICATIONS:  losartan (COZAAR) 25 MG tablet   albuterol (PROVENTIL HFA;VENTOLIN HFA) 108 (90 Base) MCG/ACT inhaler   albuterol (PROVENTIL) (2.5 MG/3ML) 0.083% nebulizer solution   amLODipine (NORVASC) 10 MG tablet   celecoxib (CELEBREX) 200 MG capsule   cetirizine (ZYRTEC) 10 MG tablet   Cholecalciferol (VITAMIN D) 50 MCG (2000 UT) tablet   clotrimazole-betamethasone (LOTRISONE) cream   cyclobenzaprine (FLEXERIL) 10 MG tablet   famotidine (PEPCID) 20 MG tablet   fluticasone (FLONASE) 50 MCG/ACT nasal spray   Fluticasone-Salmeterol (ADVAIR) 250-50 MCG/DOSE AEPB   gabapentin (NEURONTIN) 300 MG capsule   hydrOXYzine (ATARAX) 25 MG tablet   lidocaine (LIDODERM) 5 %   losartan (COZAAR) 25 MG tablet   meloxicam (MOBIC) 15 MG tablet   montelukast (SINGULAIR) 10 MG tablet   omeprazole (PRILOSEC) 40 MG capsule   ondansetron (ZOFRAN) 4 MG tablet   oxyCODONE-acetaminophen (PERCOCET/ROXICET) 5-325 MG tablet   triamcinolone cream (KENALOG) 0.1 %  No current facility-administered medications for this encounter.    Konrad Felix Ward, PA-C [facility] [phone]

## 2022-06-02 NOTE — Anesthesia Preprocedure Evaluation (Addendum)
Anesthesia Evaluation  Patient identified by MRN, date of birth, ID band Patient awake    Reviewed: Allergy & Precautions, NPO status , Patient's Chart, lab work & pertinent test results  Airway Mallampati: II  TM Distance: >3 FB Neck ROM: Full    Dental  (+) Teeth Intact   Pulmonary asthma , sleep apnea , COPD,  COPD inhaler, Patient abstained from smoking., former smoker,     + decreased breath sounds      Cardiovascular hypertension, Pt. on medications  Rhythm:Regular Rate:Normal     Neuro/Psych negative neurological ROS  negative psych ROS   GI/Hepatic Neg liver ROS, GERD  Medicated,  Endo/Other  negative endocrine ROS  Renal/GU negative Renal ROS     Musculoskeletal  (+) Arthritis ,   Abdominal   Peds  Hematology negative hematology ROS (+)   Anesthesia Other Findings   Reproductive/Obstetrics                           Anesthesia Physical Anesthesia Plan  ASA: 3  Anesthesia Plan: General   Post-op Pain Management:    Induction: Intravenous  PONV Risk Score and Plan: 3 and Ondansetron, Dexamethasone and Midazolam  Airway Management Planned: LMA  Additional Equipment: None  Intra-op Plan:   Post-operative Plan: Extubation in OR  Informed Consent: I have reviewed the patients History and Physical, chart, labs and discussed the procedure including the risks, benefits and alternatives for the proposed anesthesia with the patient or authorized representative who has indicated his/her understanding and acceptance.     Dental advisory given  Plan Discussed with: CRNA  Anesthesia Plan Comments: (See PAT note 05/18/2022)      Anesthesia Quick Evaluation

## 2022-06-03 ENCOUNTER — Ambulatory Visit (HOSPITAL_COMMUNITY): Payer: Medicare Other | Admitting: Emergency Medicine

## 2022-06-03 ENCOUNTER — Ambulatory Visit (HOSPITAL_COMMUNITY)
Admission: RE | Admit: 2022-06-03 | Discharge: 2022-06-03 | Disposition: A | Discharge status: Home / Self Care | Payer: Medicare Other | Source: Ambulatory Visit | Attending: Orthopedic Surgery | Admitting: Orthopedic Surgery

## 2022-06-03 ENCOUNTER — Encounter (HOSPITAL_COMMUNITY)
Admission: RE | Disposition: A | Discharge status: Home / Self Care | Payer: Self-pay | Source: Ambulatory Visit | Attending: Orthopedic Surgery

## 2022-06-03 ENCOUNTER — Encounter (HOSPITAL_COMMUNITY): Payer: Self-pay | Admitting: Orthopedic Surgery

## 2022-06-03 ENCOUNTER — Ambulatory Visit (HOSPITAL_BASED_OUTPATIENT_CLINIC_OR_DEPARTMENT_OTHER): Payer: Medicare Other | Admitting: Anesthesiology

## 2022-06-03 DIAGNOSIS — Z87891 Personal history of nicotine dependence: Secondary | ICD-10-CM | POA: Insufficient documentation

## 2022-06-03 DIAGNOSIS — Z79899 Other long term (current) drug therapy: Secondary | ICD-10-CM | POA: Insufficient documentation

## 2022-06-03 DIAGNOSIS — M94262 Chondromalacia, left knee: Secondary | ICD-10-CM

## 2022-06-03 DIAGNOSIS — M1990 Unspecified osteoarthritis, unspecified site: Secondary | ICD-10-CM | POA: Insufficient documentation

## 2022-06-03 DIAGNOSIS — S83282A Other tear of lateral meniscus, current injury, left knee, initial encounter: Secondary | ICD-10-CM | POA: Insufficient documentation

## 2022-06-03 DIAGNOSIS — Z01818 Encounter for other preprocedural examination: Secondary | ICD-10-CM

## 2022-06-03 DIAGNOSIS — J449 Chronic obstructive pulmonary disease, unspecified: Secondary | ICD-10-CM | POA: Diagnosis not present

## 2022-06-03 DIAGNOSIS — I10 Essential (primary) hypertension: Secondary | ICD-10-CM | POA: Diagnosis not present

## 2022-06-03 DIAGNOSIS — X58XXXA Exposure to other specified factors, initial encounter: Secondary | ICD-10-CM | POA: Insufficient documentation

## 2022-06-03 DIAGNOSIS — K219 Gastro-esophageal reflux disease without esophagitis: Secondary | ICD-10-CM | POA: Insufficient documentation

## 2022-06-03 DIAGNOSIS — G4730 Sleep apnea, unspecified: Secondary | ICD-10-CM | POA: Diagnosis not present

## 2022-06-03 HISTORY — PX: KNEE ARTHROSCOPY WITH LATERAL MENISECTOMY: SHX6193

## 2022-06-03 SURGERY — ARTHROSCOPY, KNEE, WITH LATERAL MENISCECTOMY
Anesthesia: General | Site: Knee | Laterality: Left

## 2022-06-03 MED ORDER — MIDAZOLAM HCL 5 MG/5ML IJ SOLN
INTRAMUSCULAR | Status: DC | PRN
  Administered 2022-06-03: 2 mg via INTRAVENOUS

## 2022-06-03 MED ORDER — ORAL CARE MOUTH RINSE: 15.0000 mL | Freq: Once | OROMUCOSAL | Status: DC

## 2022-06-03 MED ORDER — OXYCODONE HCL 5 MG PO TABS
5.0000 mg | ORAL_TABLET | Freq: Once | ORAL | Status: AC | PRN
  Administered 2022-06-03: 5 mg via ORAL

## 2022-06-03 MED ORDER — ORAL CARE MOUTH RINSE: 15.0000 mL | Freq: Once | OROMUCOSAL | Status: AC

## 2022-06-03 MED ORDER — DEXAMETHASONE SODIUM PHOSPHATE 10 MG/ML IJ SOLN
INTRAMUSCULAR | Status: DC | PRN
  Administered 2022-06-03: 8 mg via INTRAVENOUS

## 2022-06-03 MED ORDER — FENTANYL CITRATE (PF) 100 MCG/2ML IJ SOLN
INTRAMUSCULAR | Status: AC
  Filled 2022-06-03: qty 2

## 2022-06-03 MED ORDER — PROPOFOL 10 MG/ML IV BOLUS
INTRAVENOUS | Status: AC
Start: 2022-06-03 — End: ?
  Filled 2022-06-03: qty 20

## 2022-06-03 MED ORDER — BUPIVACAINE HCL 0.25 % IJ SOLN
INTRAMUSCULAR | Status: DC | PRN
  Administered 2022-06-03: 15 mL

## 2022-06-03 MED ORDER — OXYCODONE HCL 5 MG PO TABS
ORAL_TABLET | ORAL | Status: AC
  Filled 2022-06-03: qty 1

## 2022-06-03 MED ORDER — LACTATED RINGERS IV SOLN: INTRAVENOUS | Status: DC

## 2022-06-03 MED ORDER — SODIUM CHLORIDE 0.9 % IR SOLN
Status: DC | PRN
  Administered 2022-06-03: 3000 mL

## 2022-06-03 MED ORDER — ONDANSETRON HCL 4 MG/2ML IJ SOLN
INTRAMUSCULAR | Status: DC | PRN
  Administered 2022-06-03: 4 mg via INTRAVENOUS

## 2022-06-03 MED ORDER — CEFAZOLIN SODIUM-DEXTROSE 2-4 GM/100ML-% IV SOLN
2.0000 g | INTRAVENOUS | Status: AC
  Administered 2022-06-03: 2 g via INTRAVENOUS
  Filled 2022-06-03: qty 100

## 2022-06-03 MED ORDER — OXYCODONE HCL 5 MG/5ML PO SOLN: 5.0000 mg | Freq: Once | ORAL | Status: AC | PRN

## 2022-06-03 MED ORDER — ONDANSETRON HCL 4 MG PO TABS: 4.0000 mg | ORAL_TABLET | Freq: Three times a day (TID) | ORAL | 0 refills | Status: AC | PRN

## 2022-06-03 MED ORDER — AMISULPRIDE (ANTIEMETIC) 5 MG/2ML IV SOLN: 10.0000 mg | Freq: Once | INTRAVENOUS | Status: DC | PRN

## 2022-06-03 MED ORDER — OXYCODONE-ACETAMINOPHEN 7.5-325 MG PO TABS: 1.0000 | ORAL_TABLET | ORAL | 0 refills | Status: AC | PRN

## 2022-06-03 MED ORDER — KETOROLAC TROMETHAMINE 30 MG/ML IJ SOLN
INTRAMUSCULAR | Status: DC | PRN
  Administered 2022-06-03: 15 mg via INTRAVENOUS

## 2022-06-03 MED ORDER — CHLORHEXIDINE GLUCONATE 0.12 % MT SOLN
15.0000 mL | Freq: Once | OROMUCOSAL | Status: AC
  Administered 2022-06-03: 15 mL via OROMUCOSAL

## 2022-06-03 MED ORDER — PROMETHAZINE HCL 25 MG/ML IJ SOLN: 6.2500 mg | INTRAMUSCULAR | Status: DC | PRN

## 2022-06-03 MED ORDER — CHLORHEXIDINE GLUCONATE 0.12 % MT SOLN: 15.0000 mL | Freq: Once | OROMUCOSAL | Status: DC

## 2022-06-03 MED ORDER — FENTANYL CITRATE PF 50 MCG/ML IJ SOSY
25.0000 ug | PREFILLED_SYRINGE | INTRAMUSCULAR | Status: DC | PRN
  Administered 2022-06-03: 50 ug via INTRAVENOUS

## 2022-06-03 MED ORDER — ACETAMINOPHEN 10 MG/ML IV SOLN: 1000.0000 mg | Freq: Once | INTRAVENOUS | Status: DC | PRN

## 2022-06-03 MED ORDER — ACETAMINOPHEN 325 MG PO TABS: 325.0000 mg | ORAL_TABLET | ORAL | Status: DC | PRN

## 2022-06-03 MED ORDER — PROPOFOL 10 MG/ML IV BOLUS
INTRAVENOUS | Status: DC | PRN
  Administered 2022-06-03: 200 mg via INTRAVENOUS

## 2022-06-03 MED ORDER — FENTANYL CITRATE PF 50 MCG/ML IJ SOSY
PREFILLED_SYRINGE | INTRAMUSCULAR | Status: AC
  Filled 2022-06-03: qty 2

## 2022-06-03 MED ORDER — FENTANYL CITRATE (PF) 100 MCG/2ML IJ SOLN
INTRAMUSCULAR | Status: DC | PRN
Start: 2022-06-03 — End: 2022-06-03
  Administered 2022-06-03 (×4): 25 ug via INTRAVENOUS

## 2022-06-03 MED ORDER — KETOROLAC TROMETHAMINE 30 MG/ML IJ SOLN
INTRAMUSCULAR | Status: AC
  Filled 2022-06-03: qty 2

## 2022-06-03 MED ORDER — LIDOCAINE 2% (20 MG/ML) 5 ML SYRINGE
INTRAMUSCULAR | Status: DC | PRN
  Administered 2022-06-03: 100 mg via INTRAVENOUS

## 2022-06-03 MED ORDER — BUPIVACAINE HCL (PF) 0.25 % IJ SOLN
INTRAMUSCULAR | Status: AC
  Filled 2022-06-03: qty 30

## 2022-06-03 MED ORDER — ACETAMINOPHEN 160 MG/5ML PO SOLN: 325.0000 mg | ORAL | Status: DC | PRN

## 2022-06-03 MED ORDER — MIDAZOLAM HCL 2 MG/2ML IJ SOLN
INTRAMUSCULAR | Status: AC
  Filled 2022-06-03: qty 2

## 2022-06-03 SURGICAL SUPPLY — 34 items
BAG COUNTER SPONGE SURGICOUNT (BAG) ×1 IMPLANT
BLADE SHAVER TORPEDO 4X13 (MISCELLANEOUS) ×1 IMPLANT
BNDG ELASTIC 6X5.8 VLCR STR LF (GAUZE/BANDAGES/DRESSINGS) ×1 IMPLANT
COVER SURGICAL LIGHT HANDLE (MISCELLANEOUS) ×1 IMPLANT
CUFF TOURN SGL QUICK 34 (TOURNIQUET CUFF)
CUFF TRNQT CYL 34X4.125X (TOURNIQUET CUFF) IMPLANT
DRAPE ARTHROSCOPY W/POUCH 114 (DRAPES) ×1 IMPLANT
DRAPE SHEET LG 3/4 BI-LAMINATE (DRAPES) IMPLANT
DRAPE U-SHAPE 47X51 STRL (DRAPES) ×1 IMPLANT
DRSG PAD ABDOMINAL 8X10 ST (GAUZE/BANDAGES/DRESSINGS) IMPLANT
DURAPREP 26ML APPLICATOR (WOUND CARE) ×1 IMPLANT
GAUZE 4X4 16PLY ~~LOC~~+RFID DBL (SPONGE) ×1 IMPLANT
GAUZE PAD ABD 8X10 STRL (GAUZE/BANDAGES/DRESSINGS) ×2 IMPLANT
GAUZE SPONGE 4X4 12PLY STRL (GAUZE/BANDAGES/DRESSINGS) ×1 IMPLANT
GLOVE BIO SURGEON STRL SZ7.5 (GLOVE) ×2 IMPLANT
GLOVE BIOGEL PI IND STRL 8 (GLOVE) ×2 IMPLANT
GOWN STRL REUS W/ TWL XL LVL3 (GOWN DISPOSABLE) ×2 IMPLANT
GOWN STRL REUS W/TWL XL LVL3 (GOWN DISPOSABLE) ×2
IV NS IRRIG 3000ML ARTHROMATIC (IV SOLUTION) ×2 IMPLANT
KIT BASIN OR (CUSTOM PROCEDURE TRAY) ×1 IMPLANT
KIT TURNOVER KIT A (KITS) IMPLANT
MANIFOLD NEPTUNE II (INSTRUMENTS) ×1 IMPLANT
PACK ARTHROSCOPY WL (CUSTOM PROCEDURE TRAY) ×1 IMPLANT
PADDING CAST ABS COTTON 6X4 NS (CAST SUPPLIES) IMPLANT
PORT APPOLLO RF 90DEGREE MULTI (SURGICAL WAND) IMPLANT
PROBE HOOK APOLLO (SURGICAL WAND) IMPLANT
STRIP CLOSURE SKIN 1/2X4 (GAUZE/BANDAGES/DRESSINGS) ×1 IMPLANT
SUT ETHILON 4 0 PS 2 18 (SUTURE) IMPLANT
SUT MNCRL AB 3-0 PS2 18 (SUTURE) ×1 IMPLANT
TOWEL OR 17X26 10 PK STRL BLUE (TOWEL DISPOSABLE) ×1 IMPLANT
TUBING ARTHROSCOPY IRRIG 16FT (MISCELLANEOUS) ×1 IMPLANT
WAND APOLLORF SJ50 AR-9845 (SURGICAL WAND) IMPLANT
WATER STERILE IRR 500ML POUR (IV SOLUTION) ×1 IMPLANT
WRAP KNEE MAXI GEL POST OP (GAUZE/BANDAGES/DRESSINGS) ×1 IMPLANT

## 2022-06-03 NOTE — H&P (Signed)
ORTHOPAEDIC H and P  REQUESTING PHYSICIAN: Nicholes Stairs, MD  PCP:  Arthur Holms, NP  Chief Complaint: Left knee pain  HPI: Benjamin Abbott is a 61 y.o. male who complains of left knee pain and mechanical symptoms.  Here today for arthroscopic surgery and interventions as indicated.  No new complaints at this time.  Past Medical History:  Diagnosis Date   Arthritis    Asthma    Back pain    Chronic pain    Colon cancer (HCC)    COPD (chronic obstructive pulmonary disease) (HCC)    Dyspnea    with exertion   GERD (gastroesophageal reflux disease)    Hypertension    Knee pain    Neuropathy    Obesity    Sleep apnea    cpap   Past Surgical History:  Procedure Laterality Date   ARTHROSCOPY WITH ANTERIOR CRUCIATE LIGAMENT (ACL) REPAIR WITH ANTERIOR TIBILIAS GRAFT Right    BOWEL RESECTION     Social History   Socioeconomic History   Marital status: Single    Spouse name: Not on file   Number of children: Not on file   Years of education: Not on file   Highest education level: Not on file  Occupational History   Not on file  Tobacco Use   Smoking status: Former    Packs/day: 0.50    Years: 35.00    Total pack years: 17.50    Types: Cigarettes    Start date: 71    Quit date: 03/18/2020    Years since quitting: 2.2   Smokeless tobacco: Never  Vaping Use   Vaping Use: Never used  Substance and Sexual Activity   Alcohol use: Yes    Alcohol/week: 3.0 standard drinks of alcohol    Types: 3 Cans of beer per week    Comment: occ beer   Drug use: Yes    Types: Marijuana    Comment: daily   Sexual activity: Yes  Other Topics Concern   Not on file  Social History Narrative   Not on file   Social Determinants of Health   Financial Resource Strain: Not on file  Food Insecurity: Not on file  Transportation Needs: Not on file  Physical Activity: Not on file  Stress: Not on file  Social Connections: Not on file   Family History  Problem Relation Age  of Onset   Heart attack Mother    Heart attack Father    No Known Allergies Prior to Admission medications   Medication Sig Start Date End Date Taking? Authorizing Provider  albuterol (PROVENTIL HFA;VENTOLIN HFA) 108 (90 Base) MCG/ACT inhaler Inhale 2 puffs into the lungs every 6 (six) hours as needed for wheezing or shortness of breath. 02/28/18  Yes Diallo, Abdoulaye, MD  albuterol (PROVENTIL) (2.5 MG/3ML) 0.083% nebulizer solution Take 2.5 mg by nebulization every 6 (six) hours as needed for wheezing or shortness of breath.   Yes [provider]  amLODipine (NORVASC) 10 MG tablet TAKE ONE TABLET BY MOUTH DAILY 02/11/19  Yes Diallo, Abdoulaye, MD  celecoxib (CELEBREX) 200 MG capsule Take 200 mg by mouth 2 (two) times daily. 07/25/19  Yes [provider]  cetirizine (ZYRTEC) 10 MG tablet Take 10 mg by mouth daily.   Yes [provider]  Cholecalciferol (VITAMIN D) 50 MCG (2000 UT) tablet Take 2,000 Units by mouth daily.   Yes [provider]  clotrimazole-betamethasone (LOTRISONE) cream Apply 1 application topically 2 (two) times daily. 07/01/21  Yes Trula Slade, DPM  cyclobenzaprine (FLEXERIL) 10 MG tablet Take 10 mg by mouth at bedtime as needed for muscle spasms.   Yes [provider]  famotidine (PEPCID) 20 MG tablet Take 20 mg by mouth 2 (two) times daily.   Yes [provider]  fluticasone (FLONASE) 50 MCG/ACT nasal spray Place 1 spray into both nostrils daily as needed for allergies. 02/09/22  Yes [provider]  Fluticasone-Salmeterol (ADVAIR) 250-50 MCG/DOSE AEPB Inhale 1 puff into the lungs 2 (two) times daily. 02/28/18  Yes Diallo, Abdoulaye, MD  gabapentin (NEURONTIN) 300 MG capsule Take 1 capsule (300 mg total) by mouth 3 (three) times daily. 06/08/17  Yes Recardo Evangelist, PA-C  hydrOXYzine (ATARAX) 25 MG tablet Take 25 mg by mouth at bedtime.   Yes [provider]  lidocaine (LIDODERM) 5 % Place 1 patch onto  the skin daily. Remove & Discard patch within 12 hours or as directed by MD Patient taking differently: Place 1 patch onto the skin daily as needed (pain). Remove & Discard patch within 12 hours or as directed by MD 11/16/16  Yes Palumbo, April, MD  losartan (COZAAR) 25 MG tablet Take 1 tablet (25 mg total) by mouth daily. 05/16/22  Yes Janina Mayo, MD  meloxicam (MOBIC) 15 MG tablet Take 1 tablet (15 mg total) by mouth daily. 06/08/17  Yes Recardo Evangelist, PA-C  montelukast (SINGULAIR) 10 MG tablet Take 10 mg by mouth daily as needed (allergies).   Yes [provider]  omeprazole (PRILOSEC) 40 MG capsule Take 40 mg by mouth 2 (two) times daily. 07/25/19  Yes [provider]  ondansetron (ZOFRAN) 4 MG tablet Take 4 mg by mouth 2 (two) times daily as needed for nausea/vomiting. 04/07/22  Yes [provider]  oxyCODONE-acetaminophen (PERCOCET/ROXICET) 5-325 MG tablet Take 1 tablet by mouth every 6 (six) hours as needed for severe pain. 08/24/20  Yes Venter, Margaux, PA-C  triamcinolone cream (KENALOG) 0.1 % Apply 1 Application topically 2 (two) times daily. 04/07/22  Yes [provider]  losartan (COZAAR) 25 MG tablet Take 25 mg by mouth daily. Pt has not started received script on 05/17/22 per pt.    [provider]   No results found.  Positive ROS: All other systems have been reviewed and were otherwise negative with the exception of those mentioned in the HPI and as above.  Physical Exam: General: Alert, no acute distress Cardiovascular: No pedal edema Respiratory: No cyanosis, no use of accessory musculature GI: No organomegaly, abdomen is soft and non-tender Skin: No lesions in the area of chief complaint Neurologic: Sensation intact distally Psychiatric: Patient is competent for consent with normal mood and affect Lymphatic: No axillary or cervical lymphadenopathy  MUSCULOSKELETAL: Left lower extremity is warm and well-perfused.  No open  wounds or lesions.  He is neurovascularly intact.  Assessment: 1.  Left knee acute lateral meniscus tear, initial encounter.  Plan: Plan to proceed today with arthroscopic treatment of the left knee.  We again reviewed the risk and benefits of arthroscopic surgery with meniscectomy.  We discussed the risk of bleeding, infection, damage to surrounding nerves and vessels, stiffness, failure of pain relief, development of arthritis, DVT risk as well as the risk of anesthesia.  He has provided informed consent.  -Plan for discharge home postoperatively.    Nicholes Stairs, MD Cell [phone]    06/03/2022 1:11 PM

## 2022-06-03 NOTE — Op Note (Signed)
Surgery Date: 06/03/2022  Surgeon(s): Nicholes Stairs, MD  Assistant: Jonelle Sidle, PA-C  Assistant attestation:  PA Thereasa Solo was present for the entire procedure.  ANESTHESIA:  general, and Local  FLUIDS: Per anesthesia record.   ESTIMATED BLOOD LOSS: minimal  PREOPERATIVE DIAGNOSES:  1. Left knee Lateral meniscus tear, initial encounter 2.  Knee chondromalacia lateral femoral condyle  POSTOPERATIVE DIAGNOSES:  same  PROCEDURES PERFORMED:  1.  Left. knee arthroscopy with arthroscopic partial lateral meniscectomy 2.  Left knee arthroscopy with arthroscopic chondroplasty lateral femoral condyle and lateral tibial plateau.   DESCRIPTION OF PROCEDURE: Benjamin Abbott is a 61 y.o.-year-old male with left knee lateral meniscus tear. Plans are to proceed with partial meniscectomy and diagnostic arthroscopy with debridement as indicated. Full discussion held regarding risks benefits alternatives and complications related surgical intervention. Conservative care options reviewed. All questions answered.  The patient was identified in the preoperative holding area and the operative extremity was marked. The patient was brought to the operating room and transferred to operating table in a supine position. Satisfactory general anesthesia was induced by anesthesiology.    Standard anterolateral, anteromedial arthroscopy portals were obtained. The anteromedial portal was obtained with a spinal needle for localization under direct visualization with subsequent diagnostic findings.   Anteromedial and anterolateral chambers: mild synovitis. The synovitis was debrided with a 4.5 mm full radius shaver through both the anteromedial and lateral portals.   Suprapatellar pouch and gutters: mild synovitis or debris. Patella chondral surface: Grade 1 Trochlear chondral surface: Grade 0 Patellofemoral tracking: Midline and level Medial meniscus: Intact without tear.  Medial femoral condyle flexion  bearing surface: Grade 0 Medial femoral condyle extension bearing surface: Grade 0 Medial tibial plateau: Grade 0 Anterior cruciate ligament:stable Posterior cruciate ligament:stable Lateral meniscus: Horizontal tearing of the posterior horn in the white zone as well as some separate horizontal tearing at the mid body likewise in the white zone.   Lateral femoral condyle flexion bearing surface: Grade 3 chondromalacia noted in the mid weightbearing portion of the central flexion surface Lateral femoral condyle extension bearing surface: Grade 1 Lateral tibial plateau: Grade 2  Partial lateral meniscectomy is carried out utilizing combination of the meniscal biter as well as motorized shaver.  We took care to preserve healthy tissue and just resected the central horizontal tears.  Chondroplasty was carried out with motorized shaver on the posterior flexion surface of the lateral femoral condyle with areas of grade 3 unstable cartilage flaps.  After completion of synovectomy, diagnostic exam, and debridements as described, all compartments were checked and no residual debris remained. Hemostasis was achieved with the cautery wand. The portals were approximated with buried monocryl. All excess fluid was expressed from the joint.  Xeroform sterile gauze dressings were applied followed by Ace bandage and ice pack.   DISPOSITION: The patient was awakened from general anesthetic, extubated, taken to the recovery room in medically stable condition, no apparent complications. The patient may be weightbearing as tolerated to the operative lower extremity.  Range of motion of right knee as tolerated.

## 2022-06-03 NOTE — Progress Notes (Signed)
Orthopedic Tech Progress Note Patient Details:  Benjamin Abbott 01/25/1961 [id number]  Ortho Devices Type of Ortho Device: Crutches Ortho Device/Splint Interventions: Ordered, Adjustment   Post Interventions Patient Tolerated: Ambulated well  Benjamin Abbott 06/03/2022, 4:03 PM

## 2022-06-03 NOTE — Anesthesia Procedure Notes (Signed)
Procedure Name: LMA Insertion Date/Time: 06/03/2022 1:56 PM  Performed by: Milford Cage, CRNAPre-anesthesia Checklist: Patient identified, Emergency Drugs available, Suction available and Patient being monitored Patient Re-evaluated:Patient Re-evaluated prior to induction Oxygen Delivery Method: Circle system utilized Preoxygenation: Pre-oxygenation with 100% oxygen Induction Type: IV induction Ventilation: Mask ventilation without difficulty LMA: LMA with gastric port inserted LMA Size: 4.0 Number of attempts: 1 Tube secured with: Tape Dental Injury: Teeth and Oropharynx as per pre-operative assessment

## 2022-06-03 NOTE — Transfer of Care (Signed)
Immediate Anesthesia Transfer of Care Note  Patient: Benjamin Abbott  Procedure(s) Performed: KNEE ARTHROSCOPY WITH PARTIAL  LATERAL MENISECTOMY (Left: Knee)  Patient Location: PACU  Anesthesia Type:General  Level of Consciousness: awake  Airway & Oxygen Therapy: Patient Spontanous Breathing  Post-op Assessment: Report given to RN and Post -op Vital signs reviewed and stable  Post vital signs: Reviewed and stable  Last Vitals:  Vitals Value Taken Time  BP 130/84 06/03/22 1435  Temp 36.4 C 06/03/22 1435  Pulse 78 06/03/22 1437  Resp 23 06/03/22 1437  SpO2 100 % 06/03/22 1437  Vitals shown include unvalidated device data.  Last Pain:  Vitals:   06/03/22 1130  TempSrc:   PainSc: 0-No pain         Complications: No notable events documented.

## 2022-06-03 NOTE — Discharge Instructions (Addendum)
Post-operative patient instructions  Knee Arthroscopy   Ice:  Place intermittent ice or cooler pack over your knee, 30 minutes on and 30 minutes off.  Continue this for the first 72 hours after surgery, then save ice for use after therapy sessions or on more active days.   Weight:  You may bear weight on your leg as your symptoms allow. DVT prevention: Perform ankle pumps as able throughout the day on the operative extremity.  Be mobile as possible with ambulation as able.  You should also take an 81 mg aspirin once per day x6 weeks. Crutches:  Use crutches (or walker) to assist in walking until told to discontinue by your physical therapist or physician. This will help to reduce pain. Strengthening:  Perform simple thigh squeezes (isometric quad contractions) and straight leg lifts as you are able (3 sets of 5 to 10 repetitions, 3 times a day).  For the leg lifts, have someone support under your ankle in the beginning until you have increased strength enough to do this on your own.  To help get started on thigh squeezes, place a pillow under your knee and push down on the pillow with back of knee (sometimes easier to do than with your leg fully straight). Motion:  Perform gentle knee motion as tolerated - this is gentle bending and straightening of the knee. Seated heel slides: you can start by sitting in a chair, remove your brace, and gently slide your heel back on the floor - allowing your knee to bend. Have someone help you straighten your knee (or use your other leg/foot hooked under your ankle.  Dressing:  Perform 1st dressing change at 3 days postoperative. A moderate amount of blood tinged drainage is to be expected.  So if you bleed through the dressing on the first or second day or if you have fevers, it is fine to change the dressing/check the wounds early and redress wound. Elevate your leg.  If it bleeds through again, or if the incisions are leaking frank blood, please call the office. May  change dressing every 1-2 days thereafter to help watch wounds. Can purchase Tegderm (or 68M Nexcare) water resistant dressings at local pharmacy / [employer]. Shower:  Light shower is ok after 3 days.  Please take shower, NO bath. Recover with gauze and ace wrap to help keep wounds protected.   Pain medication:  A narcotic pain medication has been prescribed.  Take as directed.  Typically you need narcotic pain medication more regularly during the first 3 to 5 days after surgery.  Decrease your use of the medication as the pain improves.  Narcotics can sometimes cause constipation, even after a few doses.  If you have problems with constipation, you can take an over the counter stool softener or light laxative.  If you have persistent problems, please notify your physician's office. Physical therapy: Additional activity guidelines to be provided by your physician or physical therapist at follow-up visits.  Driving: You may resume driving once you have discontinued pain medication.  Call [phone] for questions or problems. Evenings you will be forwarded to the hospital operator.  Ask for the orthopaedic physician on call. Please call if you experience:    Redness, foul smelling, or persistent drainage from the surgical site  worsening knee pain and swelling not responsive to medication  any calf pain and or swelling of the lower leg  temperatures greater than 101.5 F other questions or concerns   Thank you for allowing [place]  to be a part of your care

## 2022-06-03 NOTE — Brief Op Note (Signed)
06/03/2022  2:31 PM  PATIENT:  Benjamin Abbott  61 y.o. male  PRE-OPERATIVE DIAGNOSIS:  Left knee lateral meniscal tear  POST-OPERATIVE DIAGNOSIS:  Left knee lateral meniscal tear  PROCEDURE:  Procedure(s): KNEE ARTHROSCOPY WITH PARTIAL  LATERAL MENISECTOMY (Left)  SURGEON:  Surgeon(s) and Role:    Nicholes Stairs, MD - Primary  PHYSICIAN ASSISTANT: Jonelle Sidle, PA-C   ANESTHESIA:   local and general  EBL:  5 cc  BLOOD ADMINISTERED:none  DRAINS: none   LOCAL MEDICATIONS USED:  MARCAINE     SPECIMEN:  No Specimen  DISPOSITION OF SPECIMEN:  N/A  COUNTS:  YES  TOURNIQUET:  * Missing tourniquet times found for documented tourniquets in log: [id number] *  DICTATION: .Note written in EPIC  PLAN OF CARE: Discharge to home after PACU  PATIENT DISPOSITION:  PACU - hemodynamically stable.   Delay start of Pharmacological VTE agent (>24hrs) due to surgical blood loss or risk of bleeding: not applicable

## 2022-06-03 NOTE — Anesthesia Postprocedure Evaluation (Signed)
Anesthesia Post Note  Patient: Benjamin Abbott  Procedure(s) Performed: KNEE ARTHROSCOPY WITH PARTIAL  LATERAL MENISECTOMY (Left: Knee)     Patient location during evaluation: PACU Anesthesia Type: General Level of consciousness: awake and alert Pain management: pain level controlled Vital Signs Assessment: post-procedure vital signs reviewed and stable Respiratory status: spontaneous breathing, nonlabored ventilation, respiratory function stable and patient connected to nasal cannula oxygen Cardiovascular status: blood pressure returned to baseline and stable Postop Assessment: no apparent nausea or vomiting Anesthetic complications: no   No notable events documented.  Last Vitals:  Vitals:   06/03/22 1515 06/03/22 1530  BP: (!) 128/99 (!) 164/99  Pulse: (!) 57 (!) 58  Resp: 11 19  Temp:  36.7 C  SpO2: 97% 99%    Last Pain:  Vitals:   06/03/22 1530  TempSrc:   PainSc: [name]

## 2022-06-04 ENCOUNTER — Encounter (HOSPITAL_COMMUNITY): Payer: Self-pay | Admitting: Orthopedic Surgery

## 2022-06-06 ENCOUNTER — Other Ambulatory Visit: Payer: Self-pay

## 2022-07-15 ENCOUNTER — Other Ambulatory Visit: Payer: Self-pay | Admitting: Family Medicine

## 2022-08-23 ENCOUNTER — Other Ambulatory Visit: Payer: Self-pay | Admitting: Registered Nurse

## 2022-08-23 ENCOUNTER — Ambulatory Visit
Admission: RE | Admit: 2022-08-23 | Discharge: 2022-08-23 | Disposition: A | Discharge status: Home / Self Care | Payer: Medicare Other | Source: Ambulatory Visit | Attending: Registered Nurse | Admitting: Registered Nurse

## 2022-08-23 DIAGNOSIS — M25512 Pain in left shoulder: Secondary | ICD-10-CM

## 2022-11-22 ENCOUNTER — Emergency Department (HOSPITAL_COMMUNITY): Payer: 59

## 2022-11-22 ENCOUNTER — Other Ambulatory Visit: Payer: Self-pay

## 2022-11-22 ENCOUNTER — Emergency Department (HOSPITAL_COMMUNITY)
Admission: EM | Admit: 2022-11-22 | Discharge: 2022-11-22 | Disposition: A | Discharge status: Home / Self Care | Payer: 59 | Attending: Emergency Medicine | Admitting: Emergency Medicine

## 2022-11-22 ENCOUNTER — Encounter (HOSPITAL_COMMUNITY): Payer: Self-pay | Admitting: Emergency Medicine

## 2022-11-22 DIAGNOSIS — I10 Essential (primary) hypertension: Secondary | ICD-10-CM | POA: Insufficient documentation

## 2022-11-22 DIAGNOSIS — M542 Cervicalgia: Secondary | ICD-10-CM | POA: Insufficient documentation

## 2022-11-22 DIAGNOSIS — J45909 Unspecified asthma, uncomplicated: Secondary | ICD-10-CM | POA: Diagnosis not present

## 2022-11-22 DIAGNOSIS — S0990XA Unspecified injury of head, initial encounter: Secondary | ICD-10-CM | POA: Diagnosis present

## 2022-11-22 DIAGNOSIS — Y92813 Airplane as the place of occurrence of the external cause: Secondary | ICD-10-CM | POA: Diagnosis not present

## 2022-11-22 DIAGNOSIS — Z79899 Other long term (current) drug therapy: Secondary | ICD-10-CM | POA: Insufficient documentation

## 2022-11-22 DIAGNOSIS — Y93K1 Activity, walking an animal: Secondary | ICD-10-CM | POA: Insufficient documentation

## 2022-11-22 DIAGNOSIS — W19XXXA Unspecified fall, initial encounter: Secondary | ICD-10-CM

## 2022-11-22 DIAGNOSIS — W108XXA Fall (on) (from) other stairs and steps, initial encounter: Secondary | ICD-10-CM | POA: Insufficient documentation

## 2022-11-22 DIAGNOSIS — Z7951 Long term (current) use of inhaled steroids: Secondary | ICD-10-CM | POA: Diagnosis not present

## 2022-11-22 LAB — CBC WITH DIFFERENTIAL/PLATELET
Abs Immature Granulocytes: 0.01 10*3/uL (ref 0.00–0.07)
Basophils Absolute: 0 10*3/uL (ref 0.0–0.1)
Basophils Relative: 1 %
Eosinophils Absolute: 0.2 10*3/uL (ref 0.0–0.5)
Eosinophils Relative: 3 %
HCT: 40.1 % (ref 39.0–52.0)
Hemoglobin: 13.5 g/dL (ref 13.0–17.0)
Immature Granulocytes: 0 %
Lymphocytes Relative: 39 %
Lymphs Abs: 3.1 10*3/uL (ref 0.7–4.0)
MCH: 28.5 pg (ref 26.0–34.0)
MCHC: 33.7 g/dL (ref 30.0–36.0)
MCV: 84.8 fL (ref 80.0–100.0)
Monocytes Absolute: 1 10*3/uL (ref 0.1–1.0)
Monocytes Relative: 12 %
Neutro Abs: 3.7 10*3/uL (ref 1.7–7.7)
Neutrophils Relative %: 45 %
Platelets: 191 10*3/uL (ref 150–400)
RBC: 4.73 MIL/uL (ref 4.22–5.81)
RDW: 15 % (ref 11.5–15.5)
WBC: 8 10*3/uL (ref 4.0–10.5)
nRBC: 0 % (ref 0.0–0.2)

## 2022-11-22 LAB — COMPREHENSIVE METABOLIC PANEL
ALT: 40 U/L (ref 0–44)
AST: 34 U/L (ref 15–41)
Albumin: 3.5 g/dL (ref 3.5–5.0)
Alkaline Phosphatase: 67 U/L (ref 38–126)
Anion gap: 9 (ref 5–15)
BUN: 13 mg/dL (ref 6–20)
CO2: 21 mmol/L — ABNORMAL LOW (ref 22–32)
Calcium: 8.7 mg/dL — ABNORMAL LOW (ref 8.9–10.3)
Chloride: 108 mmol/L (ref 98–111)
Creatinine, Ser: 1.12 mg/dL (ref 0.61–1.24)
GFR, Estimated: >60 mL/min (ref >60)
Glucose, Bld: 114 mg/dL — ABNORMAL HIGH (ref 70–99)
Potassium: 3.6 mmol/L (ref 3.5–5.1)
Sodium: 138 mmol/L (ref 135–145)
Total Bilirubin: 0.6 mg/dL (ref 0.3–1.2)
Total Protein: 6.4 g/dL — ABNORMAL LOW (ref 6.5–8.1)

## 2022-11-22 LAB — TROPONIN I (HIGH SENSITIVITY)
Troponin I (High Sensitivity): 17 ng/L (ref <18)
Troponin I (High Sensitivity): 14 ng/L (ref <18)

## 2022-11-22 MED ORDER — METHOCARBAMOL 500 MG PO TABS: 500.0000 mg | ORAL_TABLET | Freq: Two times a day (BID) | ORAL | 0 refills | Status: DC

## 2022-11-22 NOTE — ED Notes (Signed)
Pt alert and oriented in triage, ambulatory to bathroom and back.

## 2022-11-22 NOTE — ED Provider Notes (Signed)
[name] Provider Note   CSN: [id number] Arrival date & time: 11/22/22  [id number]     History  Chief Complaint  Patient presents with   fall down stairs    Benjamin Abbott is a 62 y.o. male who presents for evaluation after fall down a flight of metal stairs at home while walking his dog.  Endorses head trauma, initially reported that he was anticoagulated but after discussion with the patient and chart review he is on losartan for blood pressure but no anticoagulation.  No history of anticoagulation.  No LOC nausea vomiting blurry double vision.  Pain in the neck and the head.  Patient is acutely intoxicated secondary to alcohol and cocaine use prior to arrival.  History of asthma and hypertension.  No anticoagulation.   HPI     Home Medications Prior to Admission medications   Medication Sig Start Date End Date Taking? Authorizing Provider  methocarbamol (ROBAXIN) 500 MG tablet Take 1 tablet (500 mg total) by mouth 2 (two) times daily. 11/22/22  Yes Shawnte Winton R, PA-C  albuterol (PROVENTIL HFA;VENTOLIN HFA) 108 (90 Base) MCG/ACT inhaler Inhale 2 puffs into the lungs every 6 (six) hours as needed for wheezing or shortness of breath. 02/28/18   Diallo, Earna Coder, MD  albuterol (PROVENTIL) (2.5 MG/3ML) 0.083% nebulizer solution Take 2.5 mg by nebulization every 6 (six) hours as needed for wheezing or shortness of breath.    [provider]  amLODipine (NORVASC) 10 MG tablet TAKE ONE TABLET BY MOUTH DAILY 02/11/19   Diallo, Earna Coder, MD  celecoxib (CELEBREX) 200 MG capsule Take 200 mg by mouth 2 (two) times daily. 07/25/19   [provider]  cetirizine (ZYRTEC) 10 MG tablet Take 10 mg by mouth daily.    [provider]  Cholecalciferol (VITAMIN D) 50 MCG (2000 UT) tablet Take 2,000 Units by mouth daily.    [provider]  clotrimazole-betamethasone (LOTRISONE) cream Apply 1 application topically 2  (two) times daily. 07/01/21   Trula Slade, DPM  cyclobenzaprine (FLEXERIL) 10 MG tablet Take 10 mg by mouth at bedtime as needed for muscle spasms.    [provider]  famotidine (PEPCID) 20 MG tablet Take 20 mg by mouth 2 (two) times daily.    [provider]  fluticasone (FLONASE) 50 MCG/ACT nasal spray Place 1 spray into both nostrils daily as needed for allergies. 02/09/22   [provider]  Fluticasone-Salmeterol (ADVAIR) 250-50 MCG/DOSE AEPB Inhale 1 puff into the lungs 2 (two) times daily. 02/28/18   Diallo, Earna Coder, MD  gabapentin (NEURONTIN) 300 MG capsule Take 1 capsule (300 mg total) by mouth 3 (three) times daily. 06/08/17   Recardo Evangelist, PA-C  hydrOXYzine (ATARAX) 25 MG tablet Take 25 mg by mouth at bedtime.    [provider]  lidocaine (LIDODERM) 5 % Place 1 patch onto the skin daily. Remove & Discard patch within 12 hours or as directed by MD Patient taking differently: Place 1 patch onto the skin daily as needed (pain). Remove & Discard patch within 12 hours or as directed by MD 11/16/16   Randal Buba, April, MD  losartan (COZAAR) 25 MG tablet Take 1 tablet (25 mg total) by mouth daily. 05/16/22   Janina Mayo, MD  losartan (COZAAR) 25 MG tablet Take 25 mg by mouth daily. Pt has not started received script on 05/17/22 per pt.    [provider]  meloxicam (MOBIC) 15 MG tablet Take 1 tablet (  15 mg total) by mouth daily. 06/08/17   Recardo Evangelist, PA-C  montelukast (SINGULAIR) 10 MG tablet Take 10 mg by mouth daily as needed (allergies).    [provider]  omeprazole (PRILOSEC) 40 MG capsule Take 40 mg by mouth 2 (two) times daily. 07/25/19   [provider]  ondansetron (ZOFRAN) 4 MG tablet Take 4 mg by mouth 2 (two) times daily as needed for nausea/vomiting. 04/07/22   [provider]  ondansetron (ZOFRAN) 4 MG tablet Take 1 tablet (4 mg total) by mouth every 8 (eight) hours as needed for nausea or  vomiting. 06/03/22   Nicholes Stairs, MD  oxyCODONE-acetaminophen (PERCOCET) 7.5-325 MG tablet Take 1 tablet by mouth every 4 (four) hours as needed for severe pain. 06/03/22 06/03/23  Nicholes Stairs, MD  triamcinolone cream (KENALOG) 0.1 % Apply 1 Application topically 2 (two) times daily. 04/07/22   [provider]      Allergies    Patient has no known allergies.    Review of Systems   Review of Systems  Musculoskeletal:  Positive for myalgias.  Skin:  Negative for wound.  Neurological:  Positive for headaches.    Physical Exam Updated Vital Signs BP 125/86 (BP Location: Right Arm)   Pulse 67   Temp 98.3 F (36.8 C) (Oral)   Resp 18   SpO2 96%  Physical Exam Vitals and nursing note reviewed.  Constitutional:      Appearance: He is not ill-appearing or toxic-appearing.  HENT:     Head: Normocephalic and atraumatic.     Nose: Nose normal.     Mouth/Throat:     Mouth: Mucous membranes are moist.     Pharynx: Oropharynx is clear. Uvula midline. No oropharyngeal exudate or posterior oropharyngeal erythema.  Eyes:     General:        Right eye: No discharge.        Left eye: No discharge.     Extraocular Movements: Extraocular movements intact.     Conjunctiva/sclera: Conjunctivae normal.     Pupils: Pupils are equal, round, and reactive to light.  Neck:     Trachea: Trachea and phonation normal.     Comments: Placed in c-collar due to report of falling down approximately 12 stairs Cardiovascular:     Rate and Rhythm: Normal rate and regular rhythm.     Pulses: Normal pulses.     Heart sounds: Normal heart sounds. No murmur heard. Pulmonary:     Effort: Pulmonary effort is normal. No tachypnea, bradypnea, accessory muscle usage, prolonged expiration or respiratory distress.     Breath sounds: Normal breath sounds. No wheezing or rales.  Chest:     Chest wall: No mass, lacerations, deformity, swelling, tenderness, crepitus or edema.  Abdominal:      General: Bowel sounds are normal. There is no distension.     Palpations: Abdomen is soft.     Tenderness: There is no abdominal tenderness. There is no right CVA tenderness, left CVA tenderness, guarding or rebound.  Musculoskeletal:        General: No deformity.     Cervical back: Normal range of motion and neck supple.     Right lower leg: No edema.     Left lower leg: No edema.  Lymphadenopathy:     Cervical: No cervical adenopathy.  Skin:    General: Skin is warm and dry.     Capillary Refill: Capillary refill takes less than 2 seconds.  Neurological:  General: No focal deficit present.     Mental Status: He is alert and oriented to person, place, and time. Mental status is at baseline.     GCS: GCS eye subscore is 4. GCS verbal subscore is 5. GCS motor subscore is 6.     Gait: Gait is intact.  Psychiatric:        Mood and Affect: Mood normal.     ED Results / Procedures / Treatments   Labs (all labs ordered are listed, but only abnormal results are displayed) Labs Reviewed  COMPREHENSIVE METABOLIC PANEL - Abnormal; Notable for the following components:      Result Value   CO2 21 (*)    Glucose, Bld 114 (*)    Calcium 8.7 (*)    Total Protein 6.4 (*)    All other components within normal limits  CBC WITH DIFFERENTIAL/PLATELET  RAPID URINE DRUG SCREEN, HOSP PERFORMED  TROPONIN I (HIGH SENSITIVITY)  TROPONIN I (HIGH SENSITIVITY)    EKG None  Radiology DG Pelvis 1-2 Views  Result Date: 11/22/2022 CLINICAL DATA:  Status post fall. EXAM: PELVIS - 1-2 VIEW COMPARISON:  None Available. FINDINGS: There is no evidence of pelvic fracture or diastasis. No pelvic bone lesions are seen. Ill-defined surgical sutures are seen overlying the midline of the lower pelvis. IMPRESSION: No acute osseous abnormality. Electronically Signed   By: Virgina Norfolk M.D.   On: 11/22/2022 03:13   DG Forearm Right  Result Date: 11/22/2022 CLINICAL DATA:  Status post fall. EXAM: RIGHT  FOREARM - 2 VIEW COMPARISON:  None Available. FINDINGS: There is no evidence of fracture or other focal bone lesions. Soft tissues are unremarkable. IMPRESSION: Negative. Electronically Signed   By: Virgina Norfolk M.D.   On: 11/22/2022 03:12   DG Chest 2 View  Result Date: 11/22/2022 CLINICAL DATA:  Status post fall. EXAM: CHEST - 2 VIEW COMPARISON:  March 03, 2016 FINDINGS: The heart size and mediastinal contours are within normal limits. Both lungs are clear. Chronic tenth and eleventh left rib fractures are seen. IMPRESSION: 1. No active cardiopulmonary disease. 2. Chronic tenth and eleventh left rib fractures. Electronically Signed   By: Virgina Norfolk M.D.   On: 11/22/2022 03:12   CT Cervical Spine Wo Contrast  Result Date: 11/22/2022 CLINICAL DATA:  Status post fall. EXAM: CT CERVICAL SPINE WITHOUT CONTRAST TECHNIQUE: Multidetector CT imaging of the cervical spine was performed without intravenous contrast. Multiplanar CT image reconstructions were also generated. RADIATION DOSE REDUCTION: This exam was performed according to the departmental dose-optimization program which includes automated exposure control, adjustment of the mA and/or kV according to patient size and/or use of iterative reconstruction technique. COMPARISON:  None Available. FINDINGS: Alignment: There is mild reversal of the normal cervical spine lordosis. Skull base and vertebrae: No acute fracture. No primary bone lesion or focal pathologic process. Soft tissues and spinal canal: No prevertebral fluid or swelling. No visible canal hematoma. Disc levels: Mild endplate sclerosis and mild anterior osteophyte formation are seen at the level of C5-C6 and C6-C7. Mild intervertebral disc space narrowing is seen at the levels of C5-C6 and C6-C7. Normal bilateral multilevel facet joints are noted. Upper chest: Bilateral upper lobe emphysematous lung disease is seen with mild posterior right apical scarring and/or atelectasis. A cluster  of 3 mm noncalcified lung nodules is seen within the left apex. Other: None. IMPRESSION: 1. No acute fracture or subluxation in the cervical spine. 2. Mild degenerative changes at the levels of C5-C6 and C6-C7. 3.  Bilateral upper lobe emphysematous lung disease with mild posterior right apical scarring and/or atelectasis. 4. Cluster of 3 mm noncalcified left apical lung nodules. Correlation with dedicated nonemergent chest CT is recommended. Emphysema (ICD10-J43.9). Electronically Signed   By: Virgina Norfolk M.D.   On: 11/22/2022 03:09   CT HEAD WO CONTRAST (5MM)  Result Date: 11/22/2022 CLINICAL DATA:  Status post fall. EXAM: CT HEAD WITHOUT CONTRAST TECHNIQUE: Contiguous axial images were obtained from the base of the skull through the vertex without intravenous contrast. RADIATION DOSE REDUCTION: This exam was performed according to the departmental dose-optimization program which includes automated exposure control, adjustment of the mA and/or kV according to patient size and/or use of iterative reconstruction technique. COMPARISON:  None Available. FINDINGS: Brain: No evidence of acute infarction, hemorrhage, hydrocephalus, extra-axial collection or mass lesion/mass effect. Vascular: No hyperdense vessel or unexpected calcification. Skull: Normal. Negative for fracture or focal lesion. Sinuses/Orbits: No acute finding. Other: None. IMPRESSION: No acute intracranial pathology. Electronically Signed   By: Virgina Norfolk M.D.   On: 11/22/2022 03:04    Procedures Procedures    Medications Ordered in ED Medications - No data to display  ED Course/ Medical Decision Making/ A&P                             Medical Decision Making 62 year old male who presents with concern for fall down flight of stairs.  No LOC.  Hypertensive on intake, vitals otherwise normal.  Cardiopulmonary abdominal exams are benign.  Patient with GCS of 15, no evidence of trauma on physical exam.  PERRL, EOMI, placed in  C-spine for precaution given mechanism of injury.  Amount and/or Complexity of Data Reviewed Labs: ordered.    Details: CBC without leukocytosis or anemia, CMP unremarkable, troponin negative x 2.   Radiology: ordered.    Details:  CT head negative for acute cranial abnormality and CT C-spine negative for acute fracture or subluxation.  There are some left apical lung nodules which should be reevaluated with nonemergent CT in the future, discussed with patient. Plain films of the pelvis, chest, right forearm unremarkable, visualized by this provider.   Risk Prescription drug management.   Physical exam overall reassuring as well as workup.  No evidence of acute traumatic injury patient's fall.  GCS remains 15, patient ambulatory in the ED, well-appearing.  No further workup warranted in the ER at this time.  Clinical concern for emergent underlying injury that would warrant further ED workup or inpatient management is exceedingly Low.  Suhaib voiced understanding of his medical evaluation and treatment plan. Each of their questions answered to their expressed satisfaction.  Return precautions were given.  Patient is well-appearing, stable, and was discharged in good condition.  This chart was dictated using voice recognition software, Dragon. Despite the best efforts of this provider to proofread and correct errors, errors may still occur which can change documentation meaning.   Final Clinical Impression(s) / ED Diagnoses Final diagnoses:  Fall, initial encounter    Rx / DC Orders ED Discharge Orders          Ordered    methocarbamol (ROBAXIN) 500 MG tablet  2 times daily        11/22/22 0529              Randee Huston, Gypsy Balsam, PA-C 11/22/22 [id number]    Ripley Fraise, MD 11/22/22 [phone]

## 2022-11-22 NOTE — ED Provider Triage Note (Signed)
Emergency Medicine Provider Triage Evaluation Note  Benjamin Abbott , a 62 y.o. male  was evaluated in triage.  Pt complains of head and neck pain after fall down flight of metal steps tonight. No LOC,N/V/blurry/double vision.  Patient endorses alcohol and cocaine use tonight.  Marijuana use as well.  Initially said he was anticoagulated, however after further discussion medication is losartan for blood pressure.  No documentation or patient report of anticoagulation at this time.  Review of Systems  Positive: As above Negative: As above  Physical Exam  BP (!) 150/130 (BP Location: Right Arm)   Pulse 62   Temp 98.6 F (37 C)   Resp 18   SpO2 97%  Gen:   Awake, no distress   Resp:  Normal effort  MSK:   Moves extremities without difficulty  Other:  Head is atraumatic normocephalic, PERRL, EOMI.  Patient does appear acutely intoxicated.  No evidence of bruising or deformity in the chest or abdomen.  Moving all extremity spontaneously without difficulty.  Patient c-collar for C-spine precautions.  Medical Decision Making  Medically screening exam initiated at 2:42 AM.  Appropriate orders placed.  Benjamin Abbott was informed that the remainder of the evaluation will be completed by another provider, this initial triage assessment does not replace that evaluation, and the importance of remaining in the ED until their evaluation is complete.  No indication for activation of Level 2 trauma at this time.   This chart was dictated using voice recognition software, Dragon. Despite the best efforts of this provider to proofread and correct errors, errors may still occur which can change documentation meaning.    Benjamin Darling, PA-C 11/22/22 [phone]

## 2022-11-22 NOTE — ED Triage Notes (Addendum)
Pt reports he fell down 15 stairs at his home tonight, reports no LOC but states "I saw tweet bird."  Pt states he is on blood thinners.  Pt is ambulatory in triage.  Pt states he hit his head on the metal steps.  Pt c/o neck and back pain, C-collar placed for safety.  After much review, it was discovered that pt is not in fact on blood thinners.  He does drink alcohol (two beers and two shots tonight), smoked marijuana and crack cocaine tonight.

## 2022-11-22 NOTE — Discharge Instructions (Addendum)
You are seen in the ER today after a fall.  Physical exam and imaging was reassuring.  The muscles in your neck and back are in what is called spasm, meaning they are inappropriately tightened up.  This can be quite painful.  To help with your pain you may take Tylenol and / or NSAID medication (such as ibuprofen or naproxen) to help with your pain.  Additionally, you have been prescribed a muscle relaxer called Robaxin to help relieve some of the muscle spasm.  Please be advised that this medication may make you very sleepy, so you should not drive or operate heavy machinery while you are taking it.  You may also utilize topical pain relief such as Biofreeze, IcyHot, or topical lidocaine patches.  I also recommend that you apply heat to the area, such as a hot shower or heating pad, and follow heat application with massage of the muscles that are most tight.  Please return to the emergency department if you develop any numbness/tingling/weakness in your arms or legs, any difficulty urinating, or urinary incontinence chest pain, shortness of breath, abdominal pain, nausea or vomiting that does not stop, or any other new severe symptoms.

## 2023-04-17 ENCOUNTER — Ambulatory Visit: Payer: 59 | Admitting: Physician Assistant

## 2023-04-21 ENCOUNTER — Ambulatory Visit: Payer: 59 | Admitting: Physician Assistant

## 2024-04-24 ENCOUNTER — Other Ambulatory Visit: Payer: Self-pay | Admitting: Registered Nurse

## 2024-04-24 DIAGNOSIS — M7989 Other specified soft tissue disorders: Secondary | ICD-10-CM

## 2024-04-25 ENCOUNTER — Other Ambulatory Visit

## 2024-04-26 ENCOUNTER — Ambulatory Visit
Admission: RE | Admit: 2024-04-26 | Discharge: 2024-04-26 | Disposition: A | Discharge status: Home / Self Care | Source: Ambulatory Visit | Attending: Registered Nurse | Admitting: Registered Nurse

## 2024-04-26 DIAGNOSIS — M7989 Other specified soft tissue disorders: Secondary | ICD-10-CM

## 2024-06-12 ENCOUNTER — Other Ambulatory Visit: Payer: Self-pay | Admitting: Registered Nurse

## 2024-06-12 DIAGNOSIS — R2232 Localized swelling, mass and lump, left upper limb: Secondary | ICD-10-CM

## 2024-06-17 ENCOUNTER — Ambulatory Visit
Admission: RE | Admit: 2024-06-17 | Discharge: 2024-06-17 | Disposition: A | Discharge status: Home / Self Care | Source: Ambulatory Visit | Attending: Registered Nurse | Admitting: Registered Nurse

## 2024-06-17 ENCOUNTER — Other Ambulatory Visit: Payer: Self-pay | Admitting: Registered Nurse

## 2024-06-17 DIAGNOSIS — R2232 Localized swelling, mass and lump, left upper limb: Secondary | ICD-10-CM

## 2024-06-17 MED ORDER — GADOPICLENOL 0.5 MMOL/ML IV SOLN
9.0000 mL | Freq: Once | INTRAVENOUS | Status: AC | PRN
  Administered 2024-06-17: 9 mL via INTRAVENOUS

## 2024-07-01 NOTE — Progress Notes (Signed)
 Patient Name: Benjamin Abbott MR#: [id number]   Subjective:   HPI (07/01/2024):  History of Present Illness The patient is a 63 year old male who presents for left shoulder pain.  He has been experiencing persistent pain in his left shoulder, which is exacerbated by any form of movement. The discomfort is so severe that it prevents him from lying on the affected side. He reports no weakness in the shoulder but confirms that lifting it intensifies the pain. An MRI scan was performed, but he has not yet discussed the results with his healthcare provider. He has not sought any medication for this issue.   BMI Readings from Last 1 Encounters:  07/01/24 28.06 kg/m   Objective:  Physical Exam:  Right shoulder: Skin intact, no lesions, fullness of the tricep is noted No significant atrophy or asymmetry PROM R L  Flexion 160 160  ER 60 60  IR T10 T10  Strength R L  Abd 5 5  ER 5 5  IR 5 5  Positive: Neer and Hawkins impingement. Pain with empty can test. Biceps TTP. Negative: AC TTP. ER lag. SILT Ax/rad/med/ulnar + motor grip/EPL/IO Hand WWP, palpable radial pulse  Diagnostic Studies:   X-rays of the left shoulder reviewed and interpreted, demonstrating no significant degenerative changes or humeral head migration.  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 male with left shoulder rotator cuff based pain  Medical Decision Making:  We discussed the spectrum of rotator cuff based pain as well as treatment options.  He was interested in subacromial steroid injection today.  Happy to see him back again as needed if his symptoms persist or return  Plan of care:   Left shoulder subacromial injection  Large joint arthrocentesis: L subacromial bursa on 07/01/2024 10:30 AMIndications: pain Details: 21 G needle, posterior approach Medications: 8 mL BUPivacaine  HCl 0.25 % (2.5 mg/mL); 40 mg triamcinolone acetonide  40 mg/mL Outcome: tolerated well, no immediate complications Consent was given by the patient. Immediately prior to procedure a time out was called to verify the correct patient, procedure, equipment, support staff and site/side marked as required. Patient was prepped and draped in the usual sterile fashion.

## 2024-07-21 ENCOUNTER — Emergency Department (HOSPITAL_BASED_OUTPATIENT_CLINIC_OR_DEPARTMENT_OTHER): Admitting: Radiology

## 2024-07-21 ENCOUNTER — Emergency Department (HOSPITAL_BASED_OUTPATIENT_CLINIC_OR_DEPARTMENT_OTHER)

## 2024-07-21 ENCOUNTER — Other Ambulatory Visit: Payer: Self-pay

## 2024-07-21 ENCOUNTER — Inpatient Hospital Stay (HOSPITAL_BASED_OUTPATIENT_CLINIC_OR_DEPARTMENT_OTHER)
Admission: EM | Admit: 2024-07-21 | Discharge: 2024-08-01 | DRG: 286 | Disposition: A | Discharge status: Home / Self Care | Attending: Internal Medicine | Admitting: Internal Medicine

## 2024-07-21 DIAGNOSIS — I509 Heart failure, unspecified: Secondary | ICD-10-CM

## 2024-07-21 DIAGNOSIS — I5082 Biventricular heart failure: Secondary | ICD-10-CM | POA: Diagnosis present

## 2024-07-21 DIAGNOSIS — R188 Other ascites: Secondary | ICD-10-CM | POA: Diagnosis not present

## 2024-07-21 DIAGNOSIS — Z791 Long term (current) use of non-steroidal anti-inflammatories (NSAID): Secondary | ICD-10-CM

## 2024-07-21 DIAGNOSIS — Z6827 Body mass index (BMI) 27.0-27.9, adult: Secondary | ICD-10-CM

## 2024-07-21 DIAGNOSIS — Z85038 Personal history of other malignant neoplasm of large intestine: Secondary | ICD-10-CM

## 2024-07-21 DIAGNOSIS — G8929 Other chronic pain: Secondary | ICD-10-CM | POA: Diagnosis present

## 2024-07-21 DIAGNOSIS — I5021 Acute systolic (congestive) heart failure: Secondary | ICD-10-CM | POA: Diagnosis present

## 2024-07-21 DIAGNOSIS — I428 Other cardiomyopathies: Secondary | ICD-10-CM | POA: Diagnosis present

## 2024-07-21 DIAGNOSIS — I110 Hypertensive heart disease with heart failure: Secondary | ICD-10-CM | POA: Diagnosis not present

## 2024-07-21 DIAGNOSIS — Z7151 Drug abuse counseling and surveillance of drug abuser: Secondary | ICD-10-CM

## 2024-07-21 DIAGNOSIS — K7460 Unspecified cirrhosis of liver: Secondary | ICD-10-CM | POA: Diagnosis present

## 2024-07-21 DIAGNOSIS — Z9049 Acquired absence of other specified parts of digestive tract: Secondary | ICD-10-CM

## 2024-07-21 DIAGNOSIS — Z87891 Personal history of nicotine dependence: Secondary | ICD-10-CM

## 2024-07-21 DIAGNOSIS — Z7141 Alcohol abuse counseling and surveillance of alcoholic: Secondary | ICD-10-CM

## 2024-07-21 DIAGNOSIS — E8770 Fluid overload, unspecified: Secondary | ICD-10-CM | POA: Diagnosis present

## 2024-07-21 DIAGNOSIS — Z91041 Radiographic dye allergy status: Secondary | ICD-10-CM

## 2024-07-21 DIAGNOSIS — E669 Obesity, unspecified: Secondary | ICD-10-CM | POA: Diagnosis present

## 2024-07-21 DIAGNOSIS — Z79899 Other long term (current) drug therapy: Secondary | ICD-10-CM

## 2024-07-21 DIAGNOSIS — F17200 Nicotine dependence, unspecified, uncomplicated: Secondary | ICD-10-CM | POA: Diagnosis present

## 2024-07-21 DIAGNOSIS — K219 Gastro-esophageal reflux disease without esophagitis: Secondary | ICD-10-CM | POA: Diagnosis present

## 2024-07-21 DIAGNOSIS — F1210 Cannabis abuse, uncomplicated: Secondary | ICD-10-CM | POA: Diagnosis present

## 2024-07-21 DIAGNOSIS — I480 Paroxysmal atrial fibrillation: Secondary | ICD-10-CM | POA: Diagnosis present

## 2024-07-21 DIAGNOSIS — Z7951 Long term (current) use of inhaled steroids: Secondary | ICD-10-CM

## 2024-07-21 DIAGNOSIS — I4891 Unspecified atrial fibrillation: Principal | ICD-10-CM | POA: Insufficient documentation

## 2024-07-21 DIAGNOSIS — I5043 Acute on chronic combined systolic (congestive) and diastolic (congestive) heart failure: Secondary | ICD-10-CM

## 2024-07-21 DIAGNOSIS — R7989 Other specified abnormal findings of blood chemistry: Secondary | ICD-10-CM | POA: Diagnosis present

## 2024-07-21 DIAGNOSIS — G629 Polyneuropathy, unspecified: Secondary | ICD-10-CM | POA: Diagnosis present

## 2024-07-21 DIAGNOSIS — N179 Acute kidney failure, unspecified: Secondary | ICD-10-CM | POA: Diagnosis present

## 2024-07-21 DIAGNOSIS — I2510 Atherosclerotic heart disease of native coronary artery without angina pectoris: Secondary | ICD-10-CM | POA: Diagnosis present

## 2024-07-21 DIAGNOSIS — D179 Benign lipomatous neoplasm, unspecified: Secondary | ICD-10-CM | POA: Diagnosis present

## 2024-07-21 DIAGNOSIS — J4489 Other specified chronic obstructive pulmonary disease: Secondary | ICD-10-CM | POA: Diagnosis present

## 2024-07-21 DIAGNOSIS — J45909 Unspecified asthma, uncomplicated: Secondary | ICD-10-CM | POA: Diagnosis present

## 2024-07-21 DIAGNOSIS — Z7901 Long term (current) use of anticoagulants: Secondary | ICD-10-CM

## 2024-07-21 DIAGNOSIS — I2489 Other forms of acute ischemic heart disease: Secondary | ICD-10-CM | POA: Diagnosis present

## 2024-07-21 DIAGNOSIS — I4819 Other persistent atrial fibrillation: Secondary | ICD-10-CM | POA: Diagnosis present

## 2024-07-21 DIAGNOSIS — I10 Essential (primary) hypertension: Secondary | ICD-10-CM | POA: Diagnosis present

## 2024-07-21 DIAGNOSIS — Z8249 Family history of ischemic heart disease and other diseases of the circulatory system: Secondary | ICD-10-CM

## 2024-07-21 DIAGNOSIS — Z7984 Long term (current) use of oral hypoglycemic drugs: Secondary | ICD-10-CM

## 2024-07-21 LAB — URINALYSIS, ROUTINE W REFLEX MICROSCOPIC
Bacteria, UA: NONE SEEN
Bilirubin Urine: NEGATIVE
Glucose, UA: NEGATIVE mg/dL
Hgb urine dipstick: NEGATIVE
Ketones, ur: NEGATIVE mg/dL
Leukocytes,Ua: NEGATIVE
Nitrite: NEGATIVE
Protein, ur: 100 mg/dL — AB
Specific Gravity, Urine: 1.027 (ref 1.005–1.030)
pH: 5.5 (ref 5.0–8.0)

## 2024-07-21 LAB — COMPREHENSIVE METABOLIC PANEL WITH GFR
ALT: 76 U/L — ABNORMAL HIGH (ref 0–44)
AST: 44 U/L — ABNORMAL HIGH (ref 15–41)
Albumin: 3.6 g/dL (ref 3.5–5.0)
Alkaline Phosphatase: 76 U/L (ref 38–126)
Anion gap: 8 (ref 5–15)
BUN: 23 mg/dL (ref 8–23)
CO2: 21 mmol/L — ABNORMAL LOW (ref 22–32)
Calcium: 9 mg/dL (ref 8.9–10.3)
Chloride: 111 mmol/L (ref 98–111)
Creatinine, Ser: 1.24 mg/dL (ref 0.61–1.24)
GFR, Estimated: >60 mL/min (ref >60)
Glucose, Bld: 89 mg/dL (ref 70–99)
Potassium: 4.3 mmol/L (ref 3.5–5.1)
Sodium: 139 mmol/L (ref 135–145)
Total Bilirubin: 0.7 mg/dL (ref 0.0–1.2)
Total Protein: 5.9 g/dL — ABNORMAL LOW (ref 6.5–8.1)

## 2024-07-21 LAB — CBC WITH DIFFERENTIAL/PLATELET
Abs Immature Granulocytes: 0.03 K/uL (ref 0.00–0.07)
Basophils Absolute: 0 K/uL (ref 0.0–0.1)
Basophils Relative: 0 %
Eosinophils Absolute: 0.3 K/uL (ref 0.0–0.5)
Eosinophils Relative: 4 %
HCT: 38.5 % — ABNORMAL LOW (ref 39.0–52.0)
Hemoglobin: 12.6 g/dL — ABNORMAL LOW (ref 13.0–17.0)
Immature Granulocytes: 0 %
Lymphocytes Relative: 25 %
Lymphs Abs: 2 K/uL (ref 0.7–4.0)
MCH: 27.9 pg (ref 26.0–34.0)
MCHC: 32.7 g/dL (ref 30.0–36.0)
MCV: 85.2 fL (ref 80.0–100.0)
Monocytes Absolute: 0.8 K/uL (ref 0.1–1.0)
Monocytes Relative: 10 %
Neutro Abs: 5 K/uL (ref 1.7–7.7)
Neutrophils Relative %: 61 %
Platelets: 187 K/uL (ref 150–400)
RBC: 4.52 MIL/uL (ref 4.22–5.81)
RDW: 16.5 % — ABNORMAL HIGH (ref 11.5–15.5)
WBC: 8.2 K/uL (ref 4.0–10.5)
nRBC: 0 % (ref 0.0–0.2)

## 2024-07-21 LAB — TROPONIN T, HIGH SENSITIVITY
Troponin T High Sensitivity: 35 ng/L — ABNORMAL HIGH (ref 0–19)
Troponin T High Sensitivity: 38 ng/L — ABNORMAL HIGH (ref 0–19)

## 2024-07-21 LAB — LIPASE, BLOOD: Lipase: 15 U/L (ref 11–51)

## 2024-07-21 LAB — MAGNESIUM: Magnesium: 2 mg/dL (ref 1.7–2.4)

## 2024-07-21 LAB — PRO BRAIN NATRIURETIC PEPTIDE: Pro Brain Natriuretic Peptide: 2285 pg/mL — ABNORMAL HIGH (ref <300.0)

## 2024-07-21 MED ORDER — IOHEXOL 300 MG/ML  SOLN
100.0000 mL | Freq: Once | INTRAMUSCULAR | Status: AC | PRN
  Administered 2024-07-21: 100 mL via INTRAVENOUS

## 2024-07-21 MED ORDER — METOPROLOL TARTRATE 25 MG PO TABS
25.0000 mg | ORAL_TABLET | Freq: Once | ORAL | Status: AC
  Administered 2024-07-22: 25 mg via ORAL
  Filled 2024-07-21: qty 1

## 2024-07-21 MED ORDER — FUROSEMIDE 10 MG/ML IJ SOLN
40.0000 mg | Freq: Once | INTRAMUSCULAR | Status: AC
  Administered 2024-07-21: 40 mg via INTRAVENOUS
  Filled 2024-07-21: qty 4

## 2024-07-21 MED ORDER — APIXABAN 2.5 MG PO TABS
5.0000 mg | ORAL_TABLET | Freq: Two times a day (BID) | ORAL | Status: DC
  Administered 2024-07-22 – 2024-07-25 (×8): 5 mg via ORAL
  Filled 2024-07-21 (×8): qty 2

## 2024-07-21 NOTE — ED Triage Notes (Signed)
 C/o abd pain, distention, and bilateral leg swelling x 2 weeks. NAD.

## 2024-07-21 NOTE — ED Provider Notes (Signed)
 [place] EMERGENCY DEPARTMENT AT [facility] Provider Note   CSN: [id number] Arrival date & time: 07/21/24  1242     Patient presents with: Abdominal Pain   Benjamin Abbott is a 63 y.o. male.   Patient with history of hypertension, history of colon cancer status post surgery, colostomy and takedown --presents to the emergency department for evaluation of leg swelling bilaterally, abdominal pain on the left side and abdominal distention.  Patient states that symptoms have been present for about 2 weeks.  He reports shortness of breath as well, worse with exertion.  States that he becomes short of breath walking across the room.  He is able to lie flat.  No fevers or cough.  Normal bowel movements without diarrhea.  No vomiting.  Patient reports current alcohol use approximately 2 times per week.  States that I slowed down about a year ago.       Prior to Admission medications   Medication Sig Start Date End Date Taking? Authorizing Provider  albuterol  (PROVENTIL  HFA;VENTOLIN  HFA) 108 (90 Base) MCG/ACT inhaler Inhale 2 puffs into the lungs every 6 (six) hours as needed for wheezing or shortness of breath. 02/28/18   Diallo, Irving, MD  albuterol  (PROVENTIL ) (2.5 MG/3ML) 0.083% nebulizer solution Take 2.5 mg by nebulization every 6 (six) hours as needed for wheezing or shortness of breath.    [provider]  amLODipine  (NORVASC ) 10 MG tablet TAKE ONE TABLET BY MOUTH DAILY 02/11/19   Diallo, Irving, MD  celecoxib (CELEBREX) 200 MG capsule Take 200 mg by mouth 2 (two) times daily. 07/25/19   [provider]  cetirizine (ZYRTEC) 10 MG tablet Take 10 mg by mouth daily.    [provider]  Cholecalciferol (VITAMIN D) 50 MCG (2000 UT) tablet Take 2,000 Units by mouth daily.    [provider]  clotrimazole -betamethasone  (LOTRISONE ) cream Apply 1 application topically 2 (two) times daily. 07/01/21   Gershon Donnice SAUNDERS, DPM  cyclobenzaprine   (FLEXERIL ) 10 MG tablet Take 10 mg by mouth at bedtime as needed for muscle spasms.    [provider]  famotidine (PEPCID) 20 MG tablet Take 20 mg by mouth 2 (two) times daily.    [provider]  fluticasone  (FLONASE) 50 MCG/ACT nasal spray Place 1 spray into both nostrils daily as needed for allergies. 02/09/22   [provider]  Fluticasone -Salmeterol (ADVAIR) 250-50 MCG/DOSE AEPB Inhale 1 puff into the lungs 2 (two) times daily. 02/28/18   Diallo, Irving, MD  gabapentin  (NEURONTIN ) 300 MG capsule Take 1 capsule (300 mg total) by mouth 3 (three) times daily. 06/08/17   Odis Burnard Jansky, PA-C  hydrOXYzine (ATARAX) 25 MG tablet Take 25 mg by mouth at bedtime.    [provider]  lidocaine  (LIDODERM ) 5 % Place 1 patch onto the skin daily. Remove & Discard patch within 12 hours or as directed by MD Patient taking differently: Place 1 patch onto the skin daily as needed (pain). Remove & Discard patch within 12 hours or as directed by MD 11/16/16   Nettie, April, MD  losartan  (COZAAR ) 25 MG tablet Take 1 tablet (25 mg total) by mouth daily. 05/16/22   Alvan Ronal BRAVO, MD  losartan  (COZAAR ) 25 MG tablet Take 25 mg by mouth daily. Pt has not started received script on 05/17/22 per pt.    [provider]  meloxicam  (MOBIC ) 15 MG tablet Take 1 tablet (15 mg total) by mouth daily. 06/08/17   Odis Burnard Jansky, PA-C  methocarbamol  (  ROBAXIN ) 500 MG tablet Take 1 tablet (500 mg total) by mouth 2 (two) times daily. 11/22/22   Sponseller, Pleasant R, PA-C  montelukast (SINGULAIR) 10 MG tablet Take 10 mg by mouth daily as needed (allergies).    [provider]  omeprazole (PRILOSEC) 40 MG capsule Take 40 mg by mouth 2 (two) times daily. 07/25/19   [provider]  ondansetron  (ZOFRAN ) 4 MG tablet Take 4 mg by mouth 2 (two) times daily as needed for nausea/vomiting. 04/07/22   [provider]  ondansetron  (ZOFRAN ) 4 MG tablet Take 1 tablet (4 mg  total) by mouth every 8 (eight) hours as needed for nausea or vomiting. 06/03/22   Sharl Selinda Dover, MD  triamcinolone cream (KENALOG) 0.1 % Apply 1 Application topically 2 (two) times daily. 04/07/22   [provider]    Allergies: Gadolinium derivatives    Review of Systems  Updated Vital Signs BP (!) 146/97   Pulse 75   Temp 98.2 F (36.8 C)   Resp 20   SpO2 96%   Physical Exam Vitals and nursing note reviewed.  Constitutional:      General: He is not in acute distress.    Appearance: He is well-developed.  HENT:     Head: Normocephalic and atraumatic.  Eyes:     General:        Right eye: No discharge.        Left eye: No discharge.     Conjunctiva/sclera: Conjunctivae normal.  Neck:     Vascular: JVD present.  Cardiovascular:     Rate and Rhythm: Normal rate. Rhythm irregular.     Heart sounds: Normal heart sounds.  Pulmonary:     Effort: Pulmonary effort is normal.     Breath sounds: Normal breath sounds.  Abdominal:     General: There is distension.     Palpations: Abdomen is soft.     Tenderness: There is abdominal tenderness in the left lower quadrant. There is no guarding or rebound. Negative signs include Murphy's sign and McBurney's sign.     Comments: Surgical scars noted to the abdomen in the midline, and previous colostomy.  Genitourinary:    Comments: No scrotal swelling Musculoskeletal:     Cervical back: Normal range of motion and neck supple.     Right lower leg: Edema present.     Left lower leg: Edema present.  Skin:    General: Skin is warm and dry.  Neurological:     Mental Status: He is alert.     (all labs ordered are listed, but only abnormal results are displayed) Labs Reviewed  CBC WITH DIFFERENTIAL/PLATELET - Abnormal; Notable for the following components:      Result Value   Hemoglobin 12.6 (*)    HCT 38.5 (*)    RDW 16.5 (*)    All other components within normal limits  COMPREHENSIVE METABOLIC PANEL WITH GFR -  Abnormal; Notable for the following components:   CO2 21 (*)    Total Protein 5.9 (*)    AST 44 (*)    ALT 76 (*)    All other components within normal limits  URINALYSIS, ROUTINE W REFLEX MICROSCOPIC - Abnormal; Notable for the following components:   Protein, ur 100 (*)    All other components within normal limits  PRO BRAIN NATRIURETIC PEPTIDE - Abnormal; Notable for the following components:   Pro Brain Natriuretic Peptide 2,285.0 (*)    All other components within normal limits  TROPONIN  T, HIGH SENSITIVITY - Abnormal; Notable for the following components:   Troponin T High Sensitivity 35 (*)    All other components within normal limits  LIPASE, BLOOD  MAGNESIUM    EKG: EKG Interpretation Date/Time:  Sunday July 21 2024 12:53:09 EDT Ventricular Rate:  121 PR Interval:    QRS Duration:  91 QT Interval:  339 QTC Calculation: 481 R Axis:   213  Text Interpretation: Atrial fibrillation Ventricular premature complex Markedly posterior QRS axis Consider anterior infarct When compared with ecg dated 05/03/2022, atrial fibrillation has replaced sinus rhythm Confirmed by Geroldine Berg ([id number]) on 07/21/2024 12:57:21 PM  Radiology: [facility] Chest Port 1 View Result Date: 07/21/2024 EXAM: 1 VIEW(S) XRAY OF THE CHEST 07/21/2024 02:25:52 PM COMPARISON: None available. CLINICAL HISTORY: SOB (shortness of breath) [id number] FINDINGS: LUNGS AND PLEURA: No focal pulmonary opacity. No pulmonary edema. No pleural effusion. No pneumothorax. HEART AND MEDIASTINUM: Central venous congestion. Enlarged cardiac shadow. BONES AND SOFT TISSUES: No acute osseous abnormality. IMPRESSION: 1. Cardiomegaly and central venous congestion. 2. Cannot exclude pericardial effusion Electronically signed by: Norleen Boxer MD 07/21/2024 03:00 PM EDT RP Workstation: [id number]     Procedures   Medications Ordered in the ED  iohexol  (OMNIPAQUE ) 300 MG/ML solution 100 mL (100 mLs Intravenous Contrast Given 07/21/24 1524)   furosemide (LASIX) injection 40 mg (40 mg Intravenous Given 07/21/24 1545)    ED Course  Patient seen and examined. History obtained directly from patient.   Labs/EKG: Ordered CBC, CMP, lipase, UA, BNP, troponin.  EKG personally reviewed and interpreted as above.  Appears to be new onset atrial fibrillation as I cannot see that patient has a history of the same.  Imaging: Ordered chest x-ray, will likely need CT imaging of the abdomen as well  Medications/Fluids: None ordered  Most recent vital signs reviewed and are as follows: BP (!) 146/97   Pulse 75   Temp 98.2 F (36.8 C)   Resp 20   SpO2 96%   Initial impression: Patient with shortness of breath, leg and abdominal swelling, left-sided abdominal pain.  3:43 PM Reassessment performed. Patient appears stable.  Labs personally reviewed and interpreted including: CBC with hemoglobin 12.6 otherwise unremarkable; CMP minimally elevated LFTs, normal electrolytes including potassium; lipase normal; UA without signs of infection; BNP 2285; troponin elevated at 35 likely due to heart failure.  Imaging personally visualized and interpreted including: Chest x-ray with cardiomegaly and central venous congestion, cannot exclude pericardial effusion.    EMERGENCY DEPARTMENT US  CARDIAC EXAM Study: Limited Ultrasound of the Heart and Pericardium  INDICATIONS:Dyspnea Multiple views of the heart and pericardium were obtained in real-time with a multi-frequency probe.  PERFORMED AB:Fbdzoq IMAGES ARCHIVED?: Yes LIMITATIONS:  None VIEWS USED: Subcostal 4 chamber, Parasternal long axis, Parasternal short axis, Apical 4 chamber , and Inferior Vena Cava INTERPRETATION: Cardiac activity present, Pericardial effusioin absent, Decreased contractility, and IVC dilated  Reviewed pertinent lab work and imaging with patient at bedside. Questions answered.   Most current vital signs reviewed and are as follows: BP (!) 133/105   Pulse 87   Temp  98.2 F (36.8 C)   Resp 19   SpO2 95%   Plan: Will give a dose of Lasix, awaiting CT.  Will likely give dose of Lovenox.  Discussed admission for new onset heart failure, A-fib.  4:29 PM patient updated on CT results.  Clarified alcohol use history.  He denies previous history of liver problems.  He has had good urinary output thus far from dose  of Lasix.  5:26 PM Discussed case with Dr. Shona, [facility], who will admit.                                    Medical Decision Making Amount and/or Complexity of Data Reviewed Labs: ordered. Radiology: ordered.  Risk Prescription drug management. Decision regarding hospitalization.   Patient presents today with fluid overload including lower extremity swelling and abdominal swelling.  Some evidence of cirrhosis on CT imaging of the abdomen and pelvis.  Patient also has JVD and elevated BNP and cannot rule out element of CHF especially due to new onset atrial fibrillation.  Patient will be admitted for further evaluation.     Final diagnoses:  Atrial fibrillation, unspecified type (HCC)  Congestive heart failure, unspecified HF chronicity, unspecified heart failure type [facility])  Other ascites    ED Discharge Orders     None          Desiderio Chew, PA-C 07/21/24 1727    Geroldine Berg, MD 07/23/24 [phone]

## 2024-07-21 NOTE — Progress Notes (Signed)
 PHARMACY - ANTICOAGULATION CONSULT NOTE  Pharmacy Consult for apixaban Indication: atrial fibrillation  Allergies  Allergen Reactions   Gadolinium Derivatives Shortness Of Breath    Bronchospasms-Pt asthmatic and had albuterol  inhaler, which he used and it broke the spasms. 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,RN    Patient Measurements: Height: 5' 9 (175.3 cm) Weight: 86.2 kg (190 lb) IBW/kg (Calculated) : 70.7 HEPARIN DW (KG): 86.2  Vital Signs: Temp: 98.9 F (37.2 C) (10/26 1917) BP: 117/92 (10/26 2100) Pulse Rate: 78 (10/26 2100)  Labs: Recent Labs    07/21/24 1424  HGB 12.6*  HCT 38.5*  PLT 187  CREATININE 1.24    Estimated Creatinine Clearance: 68 mL/min (by C-G formula based on SCr of 1.24 mg/dL).   Medical History: Past Medical History:  Diagnosis Date   Arthritis    Asthma    Back pain    Chronic pain    Colon cancer (HCC)    COPD (chronic obstructive pulmonary disease) (HCC)    Dyspnea    with exertion   GERD (gastroesophageal reflux disease)    Hypertension    Knee pain    Neuropathy    Obesity    Sleep apnea    cpap    Assessment: 63yo male c/o abdominal pain, SOB, and BLE swelling, found to be in Afib with concern for CHF >> to begin apixaban.  Plan:  Apixaban 5mg  PO BID.  Marvetta Dauphin, PharmD, BCPS  07/21/2024,11:20 PM

## 2024-07-22 ENCOUNTER — Inpatient Hospital Stay (HOSPITAL_COMMUNITY)

## 2024-07-22 DIAGNOSIS — E8770 Fluid overload, unspecified: Secondary | ICD-10-CM | POA: Diagnosis present

## 2024-07-22 DIAGNOSIS — J4489 Other specified chronic obstructive pulmonary disease: Secondary | ICD-10-CM | POA: Diagnosis present

## 2024-07-22 DIAGNOSIS — I5021 Acute systolic (congestive) heart failure: Secondary | ICD-10-CM | POA: Diagnosis present

## 2024-07-22 DIAGNOSIS — Z7984 Long term (current) use of oral hypoglycemic drugs: Secondary | ICD-10-CM | POA: Diagnosis not present

## 2024-07-22 DIAGNOSIS — R601 Generalized edema: Secondary | ICD-10-CM

## 2024-07-22 DIAGNOSIS — I340 Nonrheumatic mitral (valve) insufficiency: Secondary | ICD-10-CM | POA: Diagnosis not present

## 2024-07-22 DIAGNOSIS — D179 Benign lipomatous neoplasm, unspecified: Secondary | ICD-10-CM | POA: Diagnosis present

## 2024-07-22 DIAGNOSIS — Z7951 Long term (current) use of inhaled steroids: Secondary | ICD-10-CM | POA: Diagnosis not present

## 2024-07-22 DIAGNOSIS — G629 Polyneuropathy, unspecified: Secondary | ICD-10-CM | POA: Diagnosis present

## 2024-07-22 DIAGNOSIS — G8929 Other chronic pain: Secondary | ICD-10-CM | POA: Diagnosis present

## 2024-07-22 DIAGNOSIS — K219 Gastro-esophageal reflux disease without esophagitis: Secondary | ICD-10-CM | POA: Diagnosis present

## 2024-07-22 DIAGNOSIS — K7031 Alcoholic cirrhosis of liver with ascites: Secondary | ICD-10-CM | POA: Diagnosis not present

## 2024-07-22 DIAGNOSIS — I509 Heart failure, unspecified: Secondary | ICD-10-CM | POA: Diagnosis not present

## 2024-07-22 DIAGNOSIS — I2510 Atherosclerotic heart disease of native coronary artery without angina pectoris: Secondary | ICD-10-CM | POA: Diagnosis present

## 2024-07-22 DIAGNOSIS — F1210 Cannabis abuse, uncomplicated: Secondary | ICD-10-CM | POA: Diagnosis present

## 2024-07-22 DIAGNOSIS — Z79899 Other long term (current) drug therapy: Secondary | ICD-10-CM | POA: Diagnosis not present

## 2024-07-22 DIAGNOSIS — K7460 Unspecified cirrhosis of liver: Secondary | ICD-10-CM | POA: Diagnosis present

## 2024-07-22 DIAGNOSIS — Z91041 Radiographic dye allergy status: Secondary | ICD-10-CM | POA: Diagnosis not present

## 2024-07-22 DIAGNOSIS — I4891 Unspecified atrial fibrillation: Secondary | ICD-10-CM | POA: Diagnosis not present

## 2024-07-22 DIAGNOSIS — I428 Other cardiomyopathies: Secondary | ICD-10-CM | POA: Diagnosis present

## 2024-07-22 DIAGNOSIS — I2489 Other forms of acute ischemic heart disease: Secondary | ICD-10-CM | POA: Diagnosis present

## 2024-07-22 DIAGNOSIS — E669 Obesity, unspecified: Secondary | ICD-10-CM | POA: Diagnosis present

## 2024-07-22 DIAGNOSIS — I5082 Biventricular heart failure: Secondary | ICD-10-CM | POA: Diagnosis present

## 2024-07-22 DIAGNOSIS — I4819 Other persistent atrial fibrillation: Secondary | ICD-10-CM | POA: Diagnosis present

## 2024-07-22 DIAGNOSIS — I351 Nonrheumatic aortic (valve) insufficiency: Secondary | ICD-10-CM | POA: Diagnosis not present

## 2024-07-22 DIAGNOSIS — Z87891 Personal history of nicotine dependence: Secondary | ICD-10-CM | POA: Diagnosis not present

## 2024-07-22 DIAGNOSIS — Z8249 Family history of ischemic heart disease and other diseases of the circulatory system: Secondary | ICD-10-CM | POA: Diagnosis not present

## 2024-07-22 DIAGNOSIS — I480 Paroxysmal atrial fibrillation: Secondary | ICD-10-CM | POA: Diagnosis present

## 2024-07-22 DIAGNOSIS — R188 Other ascites: Secondary | ICD-10-CM | POA: Diagnosis present

## 2024-07-22 DIAGNOSIS — I110 Hypertensive heart disease with heart failure: Secondary | ICD-10-CM | POA: Diagnosis present

## 2024-07-22 DIAGNOSIS — N179 Acute kidney failure, unspecified: Secondary | ICD-10-CM | POA: Diagnosis present

## 2024-07-22 DIAGNOSIS — I5023 Acute on chronic systolic (congestive) heart failure: Secondary | ICD-10-CM | POA: Diagnosis not present

## 2024-07-22 DIAGNOSIS — I5043 Acute on chronic combined systolic (congestive) and diastolic (congestive) heart failure: Secondary | ICD-10-CM | POA: Diagnosis not present

## 2024-07-22 DIAGNOSIS — Z7901 Long term (current) use of anticoagulants: Secondary | ICD-10-CM | POA: Diagnosis not present

## 2024-07-22 DIAGNOSIS — R7989 Other specified abnormal findings of blood chemistry: Secondary | ICD-10-CM | POA: Diagnosis present

## 2024-07-22 LAB — COMPREHENSIVE METABOLIC PANEL WITH GFR
ALT: 78 U/L — ABNORMAL HIGH (ref 0–44)
AST: 46 U/L — ABNORMAL HIGH (ref 15–41)
Albumin: 3.2 g/dL — ABNORMAL LOW (ref 3.5–5.0)
Alkaline Phosphatase: 69 U/L (ref 38–126)
Anion gap: 10 (ref 5–15)
BUN: 18 mg/dL (ref 8–23)
CO2: 21 mmol/L — ABNORMAL LOW (ref 22–32)
Calcium: 8.7 mg/dL — ABNORMAL LOW (ref 8.9–10.3)
Chloride: 106 mmol/L (ref 98–111)
Creatinine, Ser: 1.2 mg/dL (ref 0.61–1.24)
GFR, Estimated: >60 mL/min (ref >60)
Glucose, Bld: 102 mg/dL — ABNORMAL HIGH (ref 70–99)
Potassium: 4.3 mmol/L (ref 3.5–5.1)
Sodium: 137 mmol/L (ref 135–145)
Total Bilirubin: 1.3 mg/dL — ABNORMAL HIGH (ref 0.0–1.2)
Total Protein: 6 g/dL — ABNORMAL LOW (ref 6.5–8.1)

## 2024-07-22 LAB — CBC WITH DIFFERENTIAL/PLATELET
Abs Immature Granulocytes: 0.02 K/uL (ref 0.00–0.07)
Basophils Absolute: 0.1 K/uL (ref 0.0–0.1)
Basophils Relative: 1 %
Eosinophils Absolute: 0.3 K/uL (ref 0.0–0.5)
Eosinophils Relative: 3 %
HCT: 41.9 % (ref 39.0–52.0)
Hemoglobin: 14 g/dL (ref 13.0–17.0)
Immature Granulocytes: 0 %
Lymphocytes Relative: 24 %
Lymphs Abs: 2 K/uL (ref 0.7–4.0)
MCH: 27.8 pg (ref 26.0–34.0)
MCHC: 33.4 g/dL (ref 30.0–36.0)
MCV: 83.3 fL (ref 80.0–100.0)
Monocytes Absolute: 0.8 K/uL (ref 0.1–1.0)
Monocytes Relative: 9 %
Neutro Abs: 5.3 K/uL (ref 1.7–7.7)
Neutrophils Relative %: 63 %
Platelets: 214 K/uL (ref 150–400)
RBC: 5.03 MIL/uL (ref 4.22–5.81)
RDW: 16.6 % — ABNORMAL HIGH (ref 11.5–15.5)
WBC: 8.5 K/uL (ref 4.0–10.5)
nRBC: 0 % (ref 0.0–0.2)

## 2024-07-22 LAB — MAGNESIUM: Magnesium: 1.9 mg/dL (ref 1.7–2.4)

## 2024-07-22 LAB — BRAIN NATRIURETIC PEPTIDE: B Natriuretic Peptide: 1405 pg/mL — ABNORMAL HIGH (ref 0.0–100.0)

## 2024-07-22 LAB — HIV ANTIBODY (ROUTINE TESTING W REFLEX): HIV Screen 4th Generation wRfx: NONREACTIVE

## 2024-07-22 MED ORDER — LORATADINE 10 MG PO TABS
10.0000 mg | ORAL_TABLET | Freq: Every day | ORAL | Status: DC
  Administered 2024-07-22 – 2024-08-01 (×11): 10 mg via ORAL
  Filled 2024-07-22 (×11): qty 1

## 2024-07-22 MED ORDER — PANTOPRAZOLE SODIUM 40 MG PO TBEC
40.0000 mg | DELAYED_RELEASE_TABLET | Freq: Every day | ORAL | Status: DC
Start: 2024-07-22 — End: 2024-08-01
  Administered 2024-07-22 – 2024-08-01 (×11): 40 mg via ORAL
  Filled 2024-07-22 (×11): qty 1

## 2024-07-22 MED ORDER — ACETAMINOPHEN 325 MG PO TABS
650.0000 mg | ORAL_TABLET | ORAL | Status: DC | PRN
  Administered 2024-07-24 – 2024-07-28 (×2): 650 mg via ORAL
  Filled 2024-07-22 (×2): qty 2

## 2024-07-22 MED ORDER — HYDROXYZINE HCL 25 MG PO TABS
25.0000 mg | ORAL_TABLET | Freq: Every day | ORAL | Status: DC
  Administered 2024-07-22 – 2024-07-31 (×10): 25 mg via ORAL
  Filled 2024-07-22 (×10): qty 1

## 2024-07-22 MED ORDER — ALBUTEROL SULFATE HFA 108 (90 BASE) MCG/ACT IN AERS: 2.0000 | INHALATION_SPRAY | Freq: Four times a day (QID) | RESPIRATORY_TRACT | Status: DC | PRN

## 2024-07-22 MED ORDER — GABAPENTIN 300 MG PO CAPS
300.0000 mg | ORAL_CAPSULE | Freq: Three times a day (TID) | ORAL | Status: AC
Start: 2024-07-22 — End: ?
  Administered 2024-07-22 – 2024-08-01 (×29): 300 mg via ORAL
  Filled 2024-07-22 (×29): qty 1

## 2024-07-22 MED ORDER — ALBUTEROL SULFATE (2.5 MG/3ML) 0.083% IN NEBU: 2.5000 mg | INHALATION_SOLUTION | Freq: Two times a day (BID) | RESPIRATORY_TRACT | Status: DC

## 2024-07-22 MED ORDER — ONDANSETRON HCL 4 MG PO TABS: 4.0000 mg | ORAL_TABLET | Freq: Two times a day (BID) | ORAL | Status: DC | PRN

## 2024-07-22 MED ORDER — ALBUTEROL SULFATE (2.5 MG/3ML) 0.083% IN NEBU
2.5000 mg | INHALATION_SOLUTION | Freq: Four times a day (QID) | RESPIRATORY_TRACT | Status: DC | PRN
Start: 2024-07-22 — End: 2024-07-22
  Administered 2024-07-22 (×2): 2.5 mg via RESPIRATORY_TRACT
  Filled 2024-07-22 (×2): qty 3

## 2024-07-22 MED ORDER — METOPROLOL TARTRATE 25 MG PO TABS
25.0000 mg | ORAL_TABLET | Freq: Two times a day (BID) | ORAL | Status: DC
  Administered 2024-07-22 – 2024-07-25 (×6): 25 mg via ORAL
  Filled 2024-07-22 (×6): qty 1

## 2024-07-22 MED ORDER — METHOCARBAMOL 500 MG PO TABS
500.0000 mg | ORAL_TABLET | Freq: Two times a day (BID) | ORAL | Status: DC
  Administered 2024-07-22 – 2024-08-01 (×20): 500 mg via ORAL
  Filled 2024-07-22 (×20): qty 1

## 2024-07-22 MED ORDER — FUROSEMIDE 10 MG/ML IJ SOLN
40.0000 mg | Freq: Two times a day (BID) | INTRAMUSCULAR | Status: DC
  Administered 2024-07-22 – 2024-07-25 (×6): 40 mg via INTRAVENOUS
  Filled 2024-07-22 (×6): qty 4

## 2024-07-22 MED ORDER — SODIUM CHLORIDE 0.9% FLUSH: 3.0000 mL | INTRAVENOUS | Status: DC | PRN

## 2024-07-22 MED ORDER — SODIUM CHLORIDE 0.9% FLUSH
3.0000 mL | Freq: Two times a day (BID) | INTRAVENOUS | Status: DC
  Administered 2024-07-22 – 2024-08-01 (×15): 3 mL via INTRAVENOUS

## 2024-07-22 MED ORDER — ONDANSETRON HCL 4 MG/2ML IJ SOLN
4.0000 mg | Freq: Four times a day (QID) | INTRAMUSCULAR | Status: DC | PRN
Start: 2024-07-22 — End: 2024-07-26

## 2024-07-22 MED ORDER — VITAMIN D 25 MCG (1000 UNIT) PO TABS
2000.0000 [IU] | ORAL_TABLET | Freq: Every day | ORAL | Status: DC
  Administered 2024-07-22 – 2024-08-01 (×11): 2000 [IU] via ORAL
  Filled 2024-07-22 (×11): qty 2

## 2024-07-22 MED ORDER — FLUTICASONE FUROATE-VILANTEROL 200-25 MCG/ACT IN AEPB
1.0000 | INHALATION_SPRAY | Freq: Every day | RESPIRATORY_TRACT | Status: DC
  Administered 2024-07-23 – 2024-07-31 (×9): 1 via RESPIRATORY_TRACT
  Filled 2024-07-22: qty 28

## 2024-07-22 MED ORDER — ENOXAPARIN SODIUM 40 MG/0.4ML IJ SOSY: 40.0000 mg | PREFILLED_SYRINGE | INTRAMUSCULAR | Status: DC

## 2024-07-22 MED ORDER — FAMOTIDINE 20 MG PO TABS
20.0000 mg | ORAL_TABLET | Freq: Two times a day (BID) | ORAL | Status: DC
  Administered 2024-07-22 – 2024-07-31 (×19): 20 mg via ORAL
  Filled 2024-07-22 (×19): qty 1

## 2024-07-22 MED ORDER — SODIUM CHLORIDE 0.9 % IV SOLN: 250.0000 mL | INTRAVENOUS | Status: AC | PRN

## 2024-07-22 MED ORDER — FLUTICASONE PROPIONATE 50 MCG/ACT NA SUSP
1.0000 | Freq: Every day | NASAL | Status: DC | PRN
Start: 2024-07-22 — End: 2024-08-01

## 2024-07-22 MED ORDER — LOSARTAN POTASSIUM 25 MG PO TABS
25.0000 mg | ORAL_TABLET | Freq: Every day | ORAL | Status: DC
Start: 2024-07-22 — End: 2024-07-26
  Administered 2024-07-22 – 2024-07-26 (×5): 25 mg via ORAL
  Filled 2024-07-22 (×5): qty 1

## 2024-07-22 MED ORDER — MONTELUKAST SODIUM 10 MG PO TABS: 10.0000 mg | ORAL_TABLET | Freq: Every day | ORAL | Status: DC | PRN

## 2024-07-22 MED ORDER — ALBUTEROL SULFATE (2.5 MG/3ML) 0.083% IN NEBU
2.5000 mg | INHALATION_SOLUTION | Freq: Four times a day (QID) | RESPIRATORY_TRACT | Status: DC | PRN
  Administered 2024-07-23 (×2): 2.5 mg via RESPIRATORY_TRACT
  Filled 2024-07-22 (×2): qty 3

## 2024-07-22 NOTE — Assessment & Plan Note (Signed)
 BNP is elevated at 2000, CXR demonstrates pulmonary vascular congestion, and the patient has lower extremity edema. There is no echo available as of yet.

## 2024-07-22 NOTE — Assessment & Plan Note (Signed)
 Patient with pulmonary vascular congestion, lower extremity edema and ascites on presentation to the ED. He states that all of this started in the last week. The patient is receiving lasix 40 mg bid IV for diuresis.

## 2024-07-22 NOTE — ED Notes (Signed)
 [facility] called for transport @15 :10.  Spoke with Luke.

## 2024-07-22 NOTE — Assessment & Plan Note (Signed)
 Patient states that he no longer smokes tobacco. He does smoke pot.

## 2024-07-22 NOTE — H&P (Signed)
 History and Physical    Patient: Benjamin Abbott FMW:[id number] DOB: 14-Oct-1960 DOA: 07/21/2024 DOS: the patient was seen and examined on 07/22/2024 PCP: Leontine Cramp, NP  Patient coming from: [facility]  Chief Complaint:  Chief Complaint  Patient presents with   Abdominal Pain   HPI: Benjamin Abbott is a 63 y.o. male with medical history significant for COPD, Colon Cancer remotely, hypertension, history of tob abuse and history of heavy alcohol abuse.The patient states that he was in his usual state of health until about 2 weeks ago when he received a dose of MRI contrast for a study for his left upper extremity irregularity that turned out to be a lipoma. When he was administered the dye, the patient states that he developed tightness and heaviness in his chest, shortness of breath, and a feeling that his throat was closing up. Afterwards he states that he was sent home. A few days later the patient noticed that his abdomen was bigger and that he was getting full earlier. He states that he cannot finish a meal. He also states that he began to feel short of breath with activity. He states that his abdomen became large. He also noted swelling in his legs.   The patient went to the ED at [facility]. The patient was found to have a BNP of 2000, a new finding of atrial fibrillation with rate control, and pulmonary vascular congestion. He also was found to have a protuberant abdomen with swelling in his legs bilaterally.  CXR demonstrated pulmonary vascular congestion, CT of the abdomen and pelvis demonstrated ascites and hepatic cirrhosis.  The patient was transferred to [facility] for admission for work up by cardiology. Review of Systems: As mentioned in the history of present illness. All other systems reviewed and are negative. Past Medical History:  Diagnosis Date   Arthritis    Asthma    Back pain    Chronic pain    Colon cancer (HCC)    COPD (chronic obstructive pulmonary disease) (HCC)     Dyspnea    with exertion   GERD (gastroesophageal reflux disease)    Hypertension    Knee pain    Neuropathy    Obesity    Sleep apnea    cpap   Past Surgical History:  Procedure Laterality Date   ARTHROSCOPY WITH ANTERIOR CRUCIATE LIGAMENT (ACL) REPAIR WITH ANTERIOR TIBILIAS GRAFT Right    BOWEL RESECTION     KNEE ARTHROSCOPY WITH LATERAL MENISECTOMY Left 06/03/2022   Procedure: KNEE ARTHROSCOPY WITH PARTIAL  LATERAL MENISECTOMY;  Surgeon: Sharl Selinda Dover, MD;  Location: [facility];  Service: Orthopedics;  Laterality: Left;   Social History:  reports that he quit smoking about 4 years ago. His smoking use included cigarettes. He started smoking about 40 years ago. He has a 18.2 pack-year smoking history. He has never used smokeless tobacco. He reports current alcohol use of about 3.0 standard drinks of alcohol per week. He reports current drug use. Drug: Marijuana.  Allergies  Allergen Reactions   Gadolinium Derivatives Shortness Of Breath    Bronchospasms-Pt asthmatic and had albuterol  inhaler, which he used and it broke the spasms. Will need 13 hour prep for furture MRI lillie 06/17/24-AG,RN    Family History  Problem Relation Age of Onset   Heart attack Mother    Heart attack Father     Prior to Admission medications   Medication Sig Start Date End Date Taking? Authorizing Provider  albuterol  (PROVENTIL  HFA;VENTOLIN  HFA) 108 (90 Base) MCG/ACT  inhaler Inhale 2 puffs into the lungs every 6 (six) hours as needed for wheezing or shortness of breath. 02/28/18   Diallo, Irving, MD  albuterol  (PROVENTIL ) (2.5 MG/3ML) 0.083% nebulizer solution Take 2.5 mg by nebulization every 6 (six) hours as needed for wheezing or shortness of breath.    [provider]  amLODipine  (NORVASC ) 10 MG tablet TAKE ONE TABLET BY MOUTH DAILY 02/11/19   Diallo, Irving, MD  celecoxib (CELEBREX) 200 MG capsule Take 200 mg by mouth 2 (two) times daily. 07/25/19   [provider]   cetirizine (ZYRTEC) 10 MG tablet Take 10 mg by mouth daily.    [provider]  Cholecalciferol (VITAMIN D) 50 MCG (2000 UT) tablet Take 2,000 Units by mouth daily.    [provider]  clotrimazole -betamethasone  (LOTRISONE ) cream Apply 1 application topically 2 (two) times daily. 07/01/21   Gershon Donnice SAUNDERS, DPM  cyclobenzaprine  (FLEXERIL ) 10 MG tablet Take 10 mg by mouth at bedtime as needed for muscle spasms.    [provider]  famotidine (PEPCID) 20 MG tablet Take 20 mg by mouth 2 (two) times daily.    [provider]  fluticasone  (FLONASE) 50 MCG/ACT nasal spray Place 1 spray into both nostrils daily as needed for allergies. 02/09/22   [provider]  Fluticasone -Salmeterol (ADVAIR) 250-50 MCG/DOSE AEPB Inhale 1 puff into the lungs 2 (two) times daily. 02/28/18   Diallo, Irving, MD  gabapentin  (NEURONTIN ) 300 MG capsule Take 1 capsule (300 mg total) by mouth 3 (three) times daily. 06/08/17   Odis Burnard Jansky, PA-C  hydrOXYzine (ATARAX) 25 MG tablet Take 25 mg by mouth at bedtime.    [provider]  lidocaine  (LIDODERM ) 5 % Place 1 patch onto the skin daily. Remove & Discard patch within 12 hours or as directed by MD Patient taking differently: Place 1 patch onto the skin daily as needed (pain). Remove & Discard patch within 12 hours or as directed by MD 11/16/16   Nettie, April, MD  losartan  (COZAAR ) 25 MG tablet Take 1 tablet (25 mg total) by mouth daily. 05/16/22   Alvan Ronal BRAVO, MD  losartan  (COZAAR ) 25 MG tablet Take 25 mg by mouth daily. Pt has not started received script on 05/17/22 per pt.    [provider]  meloxicam  (MOBIC ) 15 MG tablet Take 1 tablet (15 mg total) by mouth daily. 06/08/17   Odis Burnard Jansky, PA-C  methocarbamol  (ROBAXIN ) 500 MG tablet Take 1 tablet (500 mg total) by mouth 2 (two) times daily. 11/22/22   Sponseller, Pleasant R, PA-C  montelukast (SINGULAIR) 10 MG tablet Take 10 mg by mouth daily as needed  (allergies).    [provider]  omeprazole (PRILOSEC) 40 MG capsule Take 40 mg by mouth 2 (two) times daily. 07/25/19   [provider]  ondansetron  (ZOFRAN ) 4 MG tablet Take 4 mg by mouth 2 (two) times daily as needed for nausea/vomiting. 04/07/22   [provider]  ondansetron  (ZOFRAN ) 4 MG tablet Take 1 tablet (4 mg total) by mouth every 8 (eight) hours as needed for nausea or vomiting. 06/03/22   Sharl Selinda Dover, MD  triamcinolone cream (KENALOG) 0.1 % Apply 1 Application topically 2 (two) times daily. 04/07/22   [provider]    Physical Exam: Vitals:   07/22/24 1300 07/22/24 1400 07/22/24 1500 07/22/24 1518  BP: (!) 143/108 (!) 126/113 (!) 137/113   Pulse: (!) 119 89 99 (!) 111  Resp: 20 20 19  (!) 25  Temp:    97.9 F (36.6 C)  TempSrc:    Oral  SpO2: 99% 100% 100% 100%  Weight:      Height:       Exam:  Constitutional:  The patient is awake, alert, and oriented x 3. No acute distress. Eyes:  pupils and irises appear normal Normal lids and conjunctivae ENMT:  grossly normal hearing  Lips appear normal external ears, nose appear normal Oropharynx: mucosa, tongue,posterior pharynx appear normal Neck:  neck appears normal, no masses, normal ROM, supple no thyromegaly Positive 10 CM JVD at 45 degrees. Respiratory:  No increased work of breathing. No wheezes, rales, or rhonchi No tactile fremitus Cardiovascular:  Regular rate and rhythm No murmurs, ectopy, or gallups. No lateral PMI. No thrills. Abdomen:  Abdomen is distended, not soft, and protuberant There is a fluid wave. No hernias or masses can be appreciated. Hepatomegaly can be appreciated. Distant bowel sounds.  Musculoskeletal:  The patient has a softball sized lipoma on his left upper extremity. There is 1-2 pitting edema of the lower extremities bilaterally. There is a positive homan's in the posterior aspect of the patient's left lower extremity. Skin:  No  rashes, lesions, ulcers palpation of skin: no induration or nodules Neurologic:  CN 2-12 intact Sensation all 4 extremities intact Psychiatric:  Mental status Mood, affect appropriate Orientation to person, place, time  judgment and insight appear intact  Data Reviewed:  pending  Assessment and Plan: Volume overload Patient with pulmonary vascular congestion, lower extremity edema and ascites on presentation to the ED. He states that all of this started in the last week.   Essential hypertension Blood pressure upon presentation was elevated at 152/108. The patient is on losartan , metoprolol, and amlodipine  as outpatient. Amlodipine  has been held.   Acute exacerbation of CHF (congestive heart failure) (HCC) BNP is elevated at 2000, CXR demonstrates pulmonary vascular congestion, and the patient has lower extremity edema. There is no echo available as of yet.  Tobacco dependence Patient states that he no longer smokes tobacco. He does smoke pot.  Asthma Noted. No wheezing presently. As needed albuterol  is available.  Paroxysmal atrial fibrillation (HCC) New finding of atrial fibrillation. Rate is controlled.   Hepatic cirrhosis (HCC) Finding on CT of the abdomen and pelvis. The patient states that he had been a heavy drinker up until a hear ago. He now states that he only drinks a couple of times a week. CT identifies a cirrhotic liver and ascites.     Advance Care Planning:   Code Status: Full Code   Consults: Heart failure team  Family Communication: None available  Severity of Illness: The appropriate patient status for this patient is INPATIENT. Inpatient status is judged to be reasonable and necessary in order to provide the required intensity of service to ensure the patient's safety. The patient's presenting symptoms, physical exam findings, and initial radiographic and laboratory data in the context of their chronic comorbidities is felt to place them at high risk  for further clinical deterioration. Furthermore, it is not anticipated that the patient will be medically stable for discharge from the hospital within 2 midnights of admission.   * I certify that at the point of admission it is my clinical judgment that the patient will require inpatient hospital care spanning beyond 2 midnights from the point of admission due to high intensity of service, high risk for further deterioration and high frequency of surveillance required.*  Author: Jamichael Knotts, DO 07/22/2024 5:37 PM  For on call review [email or web address].

## 2024-07-22 NOTE — Significant Event (Signed)
 Pt is A-Fib RVR 120s, B/p 160s over 100s. EKG completed, MD made aware. And now at Bedside.

## 2024-07-22 NOTE — ED Provider Notes (Signed)
 Emergency Medicine Observation Re-evaluation Note  Benjamin Abbott is a 63 y.o. male, seen on rounds today.  Pt initially presented to the ED for complaints of Abdominal Pain Currently, the patient is resting in the room.  Physical Exam  BP (!) 142/127 (BP Location: Left Arm)   Pulse (!) 51   Temp (!) 97.5 F (36.4 C) (Oral)   Resp (!) 22   Ht 5' 9 (1.753 m)   Wt 86.2 kg   SpO2 100%   BMI 28.06 kg/m  Physical Exam General: resting comfortably, NAD Lungs: normal WOB Psych: currently calm and resting  ED Course / MDM  EKG:EKG Interpretation Date/Time:  Sunday July 21 2024 12:53:09 EDT Ventricular Rate:  121 PR Interval:    QRS Duration:  91 QT Interval:  339 QTC Calculation: 481 R Axis:   213  Text Interpretation: Atrial fibrillation Ventricular premature complex Markedly posterior QRS axis Consider anterior infarct When compared with ecg dated 05/03/2022, atrial fibrillation has replaced sinus rhythm Confirmed by Geroldine Berg ([id number]) on 07/21/2024 12:57:21 PM  I have reviewed the labs performed to date as well as medications administered while in observation.  Recent changes in the last 24 hours include none.  Plan  Current plan is for admission to hospitalist for atrial fibrillation, CHF exacerbation and transfer.  Stable at this time.    Bari Roxie HERO, [place] 07/22/24 9244

## 2024-07-22 NOTE — Assessment & Plan Note (Signed)
 New finding of atrial fibrillation. Rate is controlled.

## 2024-07-22 NOTE — Assessment & Plan Note (Signed)
 Finding on CT of the abdomen and pelvis. The patient states that he had been a heavy drinker up until a hear ago. He now states that he only drinks a couple of times a week. CT identifies a cirrhotic liver and minimal ascites.

## 2024-07-22 NOTE — Assessment & Plan Note (Signed)
 Blood pressure upon presentation was elevated at 152/108. The patient is on losartan , metoprolol, and amlodipine  as outpatient. Amlodipine  has been held.

## 2024-07-22 NOTE — Assessment & Plan Note (Signed)
 Noted. No wheezing presently. As needed albuterol  is available.

## 2024-07-23 ENCOUNTER — Other Ambulatory Visit (HOSPITAL_COMMUNITY): Payer: Self-pay

## 2024-07-23 ENCOUNTER — Telehealth (HOSPITAL_COMMUNITY): Payer: Self-pay | Admitting: Pharmacy Technician

## 2024-07-23 ENCOUNTER — Inpatient Hospital Stay (HOSPITAL_COMMUNITY)

## 2024-07-23 DIAGNOSIS — I509 Heart failure, unspecified: Secondary | ICD-10-CM | POA: Diagnosis not present

## 2024-07-23 DIAGNOSIS — I5023 Acute on chronic systolic (congestive) heart failure: Secondary | ICD-10-CM | POA: Diagnosis not present

## 2024-07-23 LAB — BASIC METABOLIC PANEL WITH GFR
Anion gap: 8 (ref 5–15)
BUN: 20 mg/dL (ref 8–23)
CO2: 22 mmol/L (ref 22–32)
Calcium: 8.8 mg/dL — ABNORMAL LOW (ref 8.9–10.3)
Chloride: 107 mmol/L (ref 98–111)
Creatinine, Ser: 1.16 mg/dL (ref 0.61–1.24)
GFR, Estimated: >60 mL/min (ref >60)
Glucose, Bld: 97 mg/dL (ref 70–99)
Potassium: 3.7 mmol/L (ref 3.5–5.1)
Sodium: 137 mmol/L (ref 135–145)

## 2024-07-23 LAB — ECHOCARDIOGRAM COMPLETE
AR max vel: 1.38 cm2
AV Area VTI: 1.07 cm2
AV Area mean vel: 1.24 cm2
AV Mean grad: 5 mmHg
AV Peak grad: 9.1 mmHg
Ao pk vel: 1.51 m/s
Calc EF: 24.3 %
Height: 69 in
S' Lateral: 5.3 cm
Single Plane A2C EF: 15.6 %
Single Plane A4C EF: 34.8 %
Weight: 3195.79 [oz_av]

## 2024-07-23 MED ORDER — PERFLUTREN LIPID MICROSPHERE
1.0000 mL | INTRAVENOUS | Status: AC | PRN
  Administered 2024-07-23: 2 mL via INTRAVENOUS

## 2024-07-23 NOTE — Plan of Care (Signed)
  Problem: Education: Goal: Knowledge of General Education information will improve Description: Including pain rating scale, medication(s)/side effects and non-pharmacologic comfort measures Outcome: Not Progressing   Problem: Clinical Measurements: Goal: Diagnostic test results will improve Outcome: Not Progressing   Problem: Coping: Goal: Level of anxiety will decrease Outcome: Not Progressing   Problem: Health Behavior/Discharge Planning: Goal: Ability to manage health-related needs will improve Outcome: Progressing   Problem: Clinical Measurements: Goal: Ability to maintain clinical measurements within normal limits will improve Outcome: Progressing Goal: Will remain free from infection Outcome: Progressing Goal: Respiratory complications will improve Outcome: Progressing Goal: Cardiovascular complication will be avoided Outcome: Progressing   Problem: Activity: Goal: Risk for activity intolerance will decrease Outcome: Progressing   Problem: Nutrition: Goal: Adequate nutrition will be maintained Outcome: Progressing   Problem: Elimination: Goal: Will not experience complications related to bowel motility Outcome: Progressing Goal: Will not experience complications related to urinary retention Outcome: Progressing   Problem: Pain Managment: Goal: General experience of comfort will improve and/or be controlled Outcome: Progressing   Problem: Safety: Goal: Ability to remain free from injury will improve Outcome: Progressing   Problem: Skin Integrity: Goal: Risk for impaired skin integrity will decrease Outcome: Progressing   Problem: Education: Goal: Ability to demonstrate management of disease process will improve Outcome: Progressing Goal: Ability to verbalize understanding of medication therapies will improve Outcome: Progressing Goal: Individualized Educational Video(s) Outcome: Progressing   Problem: Activity: Goal: Capacity to carry out  activities will improve Outcome: Progressing   Problem: Cardiac: Goal: Ability to achieve and maintain adequate cardiopulmonary perfusion will improve Outcome: Progressing

## 2024-07-23 NOTE — Progress Notes (Signed)
  Echocardiogram 2D Echocardiogram has been performed.  Norleen LELON Amour 07/23/2024, 3:32 PM

## 2024-07-23 NOTE — Plan of Care (Signed)

## 2024-07-23 NOTE — Discharge Instructions (Addendum)
 Information on my medicine - ELIQUIS (apixaban)  This medication education was reviewed with me or my healthcare representative as part of my discharge preparation.    Why was Eliquis prescribed for you? Eliquis was prescribed for you to reduce the risk of a blood clot forming that can cause a stroke if you have a medical condition called atrial fibrillation (a type of irregular heartbeat).  What do You need to know about Eliquis ? Take your Eliquis TWICE DAILY - one tablet in the morning and one tablet in the evening with or without food. If you have difficulty swallowing the tablet whole please discuss with your pharmacist how to take the medication safely.  Take Eliquis exactly as prescribed by your doctor and DO NOT stop taking Eliquis without talking to the doctor who prescribed the medication.  Stopping may increase your risk of developing a stroke.  Refill your prescription before you run out.  After discharge, you should have regular check-up appointments with your healthcare provider that is prescribing your Eliquis.  In the future your dose may need to be changed if your kidney function or weight changes by a significant amount or as you get older.  What do you do if you miss a dose? If you miss a dose, take it as soon as you remember on the same day and resume taking twice daily.  Do not take more than one dose of ELIQUIS at the same time to make up a missed dose.  Important Safety Information A possible side effect of Eliquis is bleeding. You should call your healthcare provider right away if you experience any of the following: Bleeding from an injury or your nose that does not stop. Unusual colored urine (red or dark brown) or unusual colored stools (red or black). Unusual bruising for unknown reasons. A serious fall or if you hit your head (even if there is no bleeding).  Some medicines may interact with Eliquis and might increase your risk of bleeding or clotting  while on Eliquis. To help avoid this, consult your healthcare provider or pharmacist prior to using any new prescription or non-prescription medications, including herbals, vitamins, non-steroidal anti-inflammatory drugs (NSAIDs) and supplements.  This website has more information on Eliquis (apixaban): http://www.eliquis.com/eliquis/home  ======================================  Atrial Fibrillation    Atrial fibrillation is a type of heartbeat that is irregular or fast. If you have this condition, your heart beats without any order. This makes it hard for your heart to pump blood in a normal way. Atrial fibrillation may come and go, or it may become a long-lasting problem. If this condition is not treated, it can put you at higher risk for stroke, heart failure, and other heart problems.  What are the causes? This condition may be caused by diseases that damage the heart. They include: High blood pressure. Heart failure. Heart valve disease. Heart surgery. Other causes include: Diabetes. Thyroid  disease. Being overweight. Kidney disease. Sometimes the cause is not known.  What increases the risk? You are more likely to develop this condition if: You are older. You smoke. You exercise often and very hard. You have a family history of this condition. You are a man. You use drugs. You drink a lot of alcohol. You have lung conditions, such as emphysema, pneumonia, or COPD. You have sleep apnea.  What are the signs or symptoms? Common symptoms of this condition include: A feeling that your heart is beating very fast. Chest pain or discomfort. Feeling short of breath. Suddenly feeling  light-headed or weak. Getting tired easily during activity. Fainting. Sweating. In some cases, there are no symptoms.  How is this treated? Treatment for this condition depends on underlying conditions and how you feel when you have atrial fibrillation. They include: Medicines to: Prevent  blood clots. Treat heart rate or heart rhythm problems. Using devices, such as a pacemaker, to correct heart rhythm problems. Doing surgery to remove the part of the heart that sends bad signals. Closing an area where clots can form in the heart (left atrial appendage). In some cases, your doctor will treat other underlying conditions.  Follow these instructions at home:  Medicines Take over-the-counter and prescription medicines only as told by your doctor. Do not take any new medicines without first talking to your doctor. If you are taking blood thinners: Talk with your doctor before you take any medicines that have aspirin or NSAIDs, such as ibuprofen , in them. Take your medicine exactly as told by your doctor. Take it at the same time each day. Avoid activities that could hurt or bruise you. Follow instructions about how to prevent falls. Wear a bracelet that says you are taking blood thinners. Or, carry a card that lists what medicines you take. Lifestyle         Do not use any products that have nicotine or tobacco in them. These include cigarettes, e-cigarettes, and chewing tobacco. If you need help quitting, ask your doctor. Eat heart-healthy foods. Talk with your doctor about the right eating plan for you. Exercise regularly as told by your doctor. Do not drink alcohol. Lose weight if you are overweight. Do not use drugs, including cannabis.  General instructions If you have a condition that causes breathing to stop for a short period of time (apnea), treat it as told by your doctor. Keep a healthy weight. Do not use diet pills unless your doctor says they are safe for you. Diet pills may make heart problems worse. Keep all follow-up visits as told by your doctor. This is important.  Contact a doctor if: You notice a change in the speed, rhythm, or strength of your heartbeat. You are taking a blood-thinning medicine and you get more bruising. You get tired more easily  when you move or exercise. You have a sudden change in weight.  Get help right away if:    You have pain in your chest or your belly (abdomen). You have trouble breathing. You have side effects of blood thinners, such as blood in your vomit, poop (stool), or pee (urine), or bleeding that cannot stop. You have any signs of a stroke. BE FAST is an easy way to remember the main warning signs: B - Balance. Signs are dizziness, sudden trouble walking, or loss of balance. E - Eyes. Signs are trouble seeing or a change in how you see. F - Face. Signs are sudden weakness or loss of feeling in the face, or the face or eyelid drooping on one side. A - Arms. Signs are weakness or loss of feeling in an arm. This happens suddenly and usually on one side of the body. S - Speech. Signs are sudden trouble speaking, slurred speech, or trouble understanding what people say. T - Time. Time to call emergency services. Write down what time symptoms started. You have other signs of a stroke, such as: A sudden, very bad headache with no known cause. Feeling like you may vomit (nausea). Vomiting. A seizure.  These symptoms may be an emergency. Do not wait to  see if the symptoms will go away. Get medical help right away. Call your local emergency services (911 in the [place] Do not drive yourself to the hospital. Summary Atrial fibrillation is a type of heartbeat that is irregular or fast. You are at higher risk of this condition if you smoke, are older, have diabetes, or are overweight. Follow your doctor's instructions about medicines, diet, exercise, and follow-up visits. Get help right away if you have signs or symptoms of a stroke. Get help right away if you cannot catch your breath, or you have chest pain or discomfort. This information is not intended to replace advice given to you by your health care provider. Make sure you discuss any questions you have with your health care provider. Document  Revised: 03/06/2019 Document Reviewed: 03/06/2019 Elsevier Patient Education  2020 [employer].   ________________________________________________________________________________________________  [place]: Flora Deck Department of Health: Call [facility] and Mobility Services at [phone] for details. attractionguides.es  -[employer] [place]: [employer] [place] is the [employer]'s shared-ride transportation service for eligible riders who have a disability that prevents them from riding the fixed route bus. Call [phone]. [employer] [place] riders must pay a fare of $1.50 per trip, or may purchase a 10-ride punch card for $14.00 ($1.40 per ride) or a 40-ride punch card for $48.00 ($1.20 per ride).  -The [employer] rideshare transportation service is provided for senior citizens (60+) who live independently within [place] city limits and are unable to drive or have limited access to transportation. Call [phone] to schedule an appointment.  -[place] [employer]: For Medicare or Medicaid recipients call [phone]?[facility], wheelchair fleeta, and ambulatory quotes available.   MEDICAID TRANSPORTATION: -If you have a Medicaid blue card or pink card and have no other means for transportation to doctor's offices, clinics, dentists, hospitals, and other health related trip needs.  -Transportation services are available to all [place] locations. Trips to [employer] and [place] are provided in association with [employer]. -Services are provided between 6:00AM and 9:00PM Monday-Friday. -Call [phone] to schedule a trip or request further information.

## 2024-07-23 NOTE — Telephone Encounter (Signed)
 Patient Product/process development scientist completed.    The patient is insured through [facility]. Patient has Medicare and is not eligible for a copay card, but may be able to apply for patient assistance or Medicare RX Payment Plan (Patient Must reach out to their plan, if eligible for payment plan), if available.    Ran test claim for Eliquis 5 mg and the current 30 day co-pay is $0.00.   This test claim was processed through [facility]- copay amounts may vary at other pharmacies due to pharmacy/plan contracts, or as the patient moves through the different stages of their insurance plan.     Reyes Sharps, CPHT Pharmacy Technician Patient Advocate Specialist Lead [facility] Patient Advocate Team Direct Number: [phone]  Fax: [phone]

## 2024-07-24 ENCOUNTER — Telehealth (HOSPITAL_COMMUNITY): Payer: Self-pay | Admitting: Pharmacy Technician

## 2024-07-24 ENCOUNTER — Encounter (HOSPITAL_COMMUNITY): Payer: Self-pay | Admitting: Internal Medicine

## 2024-07-24 ENCOUNTER — Inpatient Hospital Stay (HOSPITAL_COMMUNITY)

## 2024-07-24 ENCOUNTER — Other Ambulatory Visit (HOSPITAL_COMMUNITY): Payer: Self-pay

## 2024-07-24 DIAGNOSIS — I5023 Acute on chronic systolic (congestive) heart failure: Secondary | ICD-10-CM

## 2024-07-24 LAB — CBC WITH DIFFERENTIAL/PLATELET
Abs Immature Granulocytes: 0.03 K/uL (ref 0.00–0.07)
Basophils Absolute: 0 K/uL (ref 0.0–0.1)
Basophils Relative: 0 %
Eosinophils Absolute: 0.3 K/uL (ref 0.0–0.5)
Eosinophils Relative: 3 %
HCT: 40.1 % (ref 39.0–52.0)
Hemoglobin: 13.5 g/dL (ref 13.0–17.0)
Immature Granulocytes: 0 %
Lymphocytes Relative: 27 %
Lymphs Abs: 2.2 K/uL (ref 0.7–4.0)
MCH: 28 pg (ref 26.0–34.0)
MCHC: 33.7 g/dL (ref 30.0–36.0)
MCV: 83.2 fL (ref 80.0–100.0)
Monocytes Absolute: 0.8 K/uL (ref 0.1–1.0)
Monocytes Relative: 11 %
Neutro Abs: 4.7 K/uL (ref 1.7–7.7)
Neutrophils Relative %: 59 %
Platelets: 199 K/uL (ref 150–400)
RBC: 4.82 MIL/uL (ref 4.22–5.81)
RDW: 15.8 % — ABNORMAL HIGH (ref 11.5–15.5)
WBC: 8 K/uL (ref 4.0–10.5)
nRBC: 0 % (ref 0.0–0.2)

## 2024-07-24 LAB — BASIC METABOLIC PANEL WITH GFR
Anion gap: 8 (ref 5–15)
BUN: 20 mg/dL (ref 8–23)
CO2: 24 mmol/L (ref 22–32)
Calcium: 8.7 mg/dL — ABNORMAL LOW (ref 8.9–10.3)
Chloride: 103 mmol/L (ref 98–111)
Creatinine, Ser: 1.35 mg/dL — ABNORMAL HIGH (ref 0.61–1.24)
GFR, Estimated: 60 mL/min — ABNORMAL LOW (ref >60)
Glucose, Bld: 120 mg/dL — ABNORMAL HIGH (ref 70–99)
Potassium: 3.4 mmol/L — ABNORMAL LOW (ref 3.5–5.1)
Sodium: 135 mmol/L (ref 135–145)

## 2024-07-24 MED ORDER — SPIRONOLACTONE 25 MG PO TABS
25.0000 mg | ORAL_TABLET | Freq: Every day | ORAL | Status: DC
  Administered 2024-07-24 – 2024-07-26 (×3): 25 mg via ORAL
  Filled 2024-07-24 (×3): qty 1

## 2024-07-24 MED ORDER — NALOXONE HCL 0.4 MG/ML IJ SOLN: 0.4000 mg | INTRAMUSCULAR | Status: DC | PRN

## 2024-07-24 MED ORDER — HYDROMORPHONE HCL 1 MG/ML IJ SOLN
0.5000 mg | INTRAMUSCULAR | Status: DC | PRN
  Administered 2024-07-24 – 2024-07-29 (×5): 0.5 mg via INTRAVENOUS
  Filled 2024-07-24 (×5): qty 1

## 2024-07-24 MED ORDER — EMPAGLIFLOZIN 10 MG PO TABS
10.0000 mg | ORAL_TABLET | Freq: Every day | ORAL | Status: DC
  Administered 2024-07-24 – 2024-07-25 (×2): 10 mg via ORAL
  Filled 2024-07-24 (×2): qty 1

## 2024-07-24 NOTE — Progress Notes (Signed)
 Heart Failure Nurse Navigator Progress Note  PCP: Leontine Cramp, NP PCP-Cardiologist: Alvan Admission Diagnosis: Atrial Fibrillation, Congestive heart failure, Ascites Admitted from: Home  Presentation:   Arlon Dandy presented with bilateral leg swelling, abdomen pain with distention.New  BP 146/97, HR 75, BNP 1,405, Pro  BNP 2,285, CXR demonstrated pulmonary vascular congestion, CT of the abdomen and pelvis demonstrated ascites and hepatic cirrhosis. patient states that he had been a heavy drinker up until a hear ago. He now states that he only drinks a couple of times a week .  Patient was educated on the sign and symptoms of heart failure, daily weights, when to call his doctor or go to the ED. Diet/ fluid restrictions and taking all his medications as prescribed, and attending all medical appointments. Patient verbalized his understanding of all education. A HF TOC appointment was scheduled for 08/02/2024 @ 9:45 am.   ECHO/ LVEF: 25-30% New  Clinical Course:  Past Medical History:  Diagnosis Date   Arthritis    Asthma    Back pain    Chronic pain    Colon cancer (HCC)    COPD (chronic obstructive pulmonary disease) (HCC)    Dyspnea    with exertion   GERD (gastroesophageal reflux disease)    Hypertension    Knee pain    Neuropathy    Obesity    Sleep apnea    cpap     Social History   Socioeconomic History   Marital status: Single    Spouse name: Not on file   Number of children: Not on file   Years of education: Not on file   Highest education level: Not on file  Occupational History   Not on file  Tobacco Use   Smoking status: Former    Current packs/day: 0.00    Average packs/day: 0.5 packs/day for 36.5 years (18.2 ttl pk-yrs)    Types: Cigarettes    Start date: 47    Quit date: 03/18/2020    Years since quitting: 4.3   Smokeless tobacco: Never  Vaping Use   Vaping status: Never Used  Substance and Sexual Activity   Alcohol use: Yes    Alcohol/week:  3.0 standard drinks of alcohol    Types: 3 Cans of beer per week    Comment: occ beer   Drug use: Yes    Types: Marijuana    Comment: daily   Sexual activity: Yes  Other Topics Concern   Not on file  Social History Narrative   Not on file   Social Drivers of Health   Financial Resource Strain: Not on file  Food Insecurity: No Food Insecurity (07/22/2024)   Hunger Vital Sign    Worried About Running Out of Food in the Last Year: Never true    Ran Out of Food in the Last Year: Never true  Transportation Needs: No Transportation Needs (07/22/2024)   PRAPARE - Administrator, Civil Service (Medical): No    Lack of Transportation (Non-Medical): No  Physical Activity: Not on file  Stress: Not on file  Social Connections: Unknown (11/08/2022)   Received from [facility]   Social Network    Social Network: Not on file   Education Assessment and Provision:  Detailed education and instructions provided on heart failure disease management including the following:  Signs and symptoms of Heart Failure When to call the physician Importance of daily weights Low sodium diet Fluid restriction Medication management Anticipated future follow-up appointments  Patient education  given on each of the above topics.  Patient acknowledges understanding via teach back method and acceptance of all instructions.  Education Materials:  Living Better With Heart Failure Booklet, HF zone tool, & Daily Weight Tracker Tool.  Patient has scale at home: yes Patient has pill box at home: No, will buy one     High Risk Criteria for Readmission and/or Poor Patient Outcomes: Heart failure hospital admissions (last 6 months): 1  No Show rate: 18 % Difficult social situation: No, Lives alone Demonstrates medication adherence: Yes Primary Language: English Literacy level: Reading, writing, and comprehension  Barriers of Care:   New HF Diet/ fluid restrictions ( fluid amount, salt) Daily  weights  Considerations/Referrals:   Referral made to Heart Failure Pharmacist Stewardship: Yes Referral made to Heart Failure CSW/NCM TOC: NA Referral made to Heart & Vascular TOC clinic: Yes, 08/02/2024 @ 9;45 am  Items for Follow-up on DC/TOC: Continued HF education Diet/ fluid restrictions/ daily weights   Stephane Haddock, BSN, RN Heart Failure Teacher, Adult Education Only

## 2024-07-24 NOTE — Telephone Encounter (Signed)
 Patient Product/process Development Scientist completed.    The patient is insured through [facility]. Patient has Medicare and is not eligible for a copay card, but may be able to apply for patient assistance or Medicare RX Payment Plan (Patient Must reach out to their plan, if eligible for payment plan), if available.    Ran test claim for Entresto 24-26 mg and the current 30 day co-pay is $0.00.  Ran test claim for Farxgia 10 mg and the current 30 day co-pay is $0.00.  Ran test claim for Jardiance 10 mg and the current 30 day co-pay is $0.00.  This test claim was processed through [facility]- copay amounts may vary at other pharmacies due to pharmacy/plan contracts, or as the patient moves through the different stages of their insurance plan.     Reyes Sharps, CPHT Pharmacy Technician Patient Advocate Specialist Lead [facility] Patient Advocate Team Direct Number: [phone]  Fax: [phone]

## 2024-07-24 NOTE — Progress Notes (Signed)
 TRH night cross cover note:   I was notified by the patient's RN that the patient is complaining of new onset sharp in the left portion of his abdomen a/w sensation of feelings bloated and mildly distended.  He subsequently received a dose of prn acetaminophen , but without any significant improvement in his abdominal discomfort.  I subsequently ordered plain film of the abdomen, prn IV Dilaudid  as well as a prn simethicone.    Eva Pore, DO Hospitalist

## 2024-07-24 NOTE — Plan of Care (Signed)
  Problem: Education: Goal: Knowledge of General Education information will improve Description: Including pain rating scale, medication(s)/side effects and non-pharmacologic comfort measures Outcome: Progressing   Problem: Clinical Measurements: Goal: Ability to maintain clinical measurements within normal limits will improve Outcome: Progressing   Problem: Clinical Measurements: Goal: Will remain free from infection Outcome: Progressing   Problem: Activity: Goal: Risk for activity intolerance will decrease Outcome: Progressing   Problem: Nutrition: Goal: Adequate nutrition will be maintained Outcome: Progressing   Problem: Safety: Goal: Ability to remain free from injury will improve Outcome: Progressing   Problem: Pain Managment: Goal: General experience of comfort will improve and/or be controlled Outcome: Progressing   Problem: Skin Integrity: Goal: Risk for impaired skin integrity will decrease Outcome: Progressing

## 2024-07-24 NOTE — Plan of Care (Signed)
   Problem: Education: Goal: Knowledge of General Education information will improve Description Including pain rating scale, medication(s)/side effects and non-pharmacologic comfort measures Outcome: Progressing

## 2024-07-24 NOTE — Progress Notes (Signed)
   Heart Failure Stewardship Pharmacist Progress Note   PCP: Leontine Cramp, NP PCP-Cardiologist: Alvan Ronal BRAVO, MD (Inactive)    HPI:  63 yo M with PMH of HTN, colon cancer, COPD, history of tobacco use, and history of heavy alcohol use.   Presented to the ED on 10/26 with shortness of breath, LE edema, abdominal pain and distention for two weeks. CXR with cardiomegaly and central venous congestion. BNP [id number]. Found to be in rate controlled afib. ECHO 10/28 with LVEF 25-30%, RWMA, mild LVH, RV normal, moderate to severe MR, severely dilated pulmonary artery.   States that he is still short of breath. Reports orthopnea and chest tightness. States his LE edema is improving but is having cramping. Reviewed HF GDMT and goals of therapy.   Current HF Medications: Diuretic: furosemide 40 mg IV BID Beta Blocker: metoprolol tartrate 25 mg BID ACE/ARB/ARNI: losartan  25 mg daily MRA: spironolactone 25 mg daily SGLT2i: Jardiance 10 mg daily  Prior to admission HF Medications: ACE/ARB/ARNI: losartan  50 mg daily  Pertinent Lab Values: Serum creatinine 1.35, BUN 20, Potassium 3.4, Sodium 135, BNP 1405, Magnesium 1.9  Vital Signs: Weight: 190 lbs (admission weight: 199 lbs) Blood pressure: 100-140/90s  Heart rate: 90s  I/O: net -4L yesterday; net -13.3L since admission  Medication Assistance / Insurance Benefits Check: Does the patient have prescription insurance?  Yes Type of insurance plan: UHC Medicare + Medicaid  Outpatient Pharmacy:  Prior to admission outpatient pharmacy: [facility] Is the patient willing to use [facility] TOC pharmacy at discharge? Yes Is the patient willing to transition their outpatient pharmacy to utilize a [facility] outpatient pharmacy?   No   Assessment: 1. Acute systolic CHF (LVEF 25-30%), due to unknown etiology - recommend ischemic evaluation. NYHA class III symptoms. - Continue furosemide 40 mg IV BID. Strict I/Os and daily weights. Keep K>4 and  Mg>2. Recommend adding KCl 40 mEq x 1. - Continue metoprolol tartrate 25 mg BID - will need to transition to metoprolol succinate prior to discharge.  - Continue losartan  25 mg daily - would transition to Entresto 24/26 mg BID if BP remains elevated - Agree with adding spironolactone 25 mg daily - Agree with adding Jardiance 10 mg daily   Plan: 1) Medication changes recommended at this time: - Transition to metoprolol succinate prior to discharge - Transition losartan  to Entresto if BP remains elevated - Add KCl 40 mEq x 1  2) Patient assistance: - Entresto copay $0 - Farxiga/Jardiance copay $0  3)  Education  - Patient has been educated on current HF medications and potential additions to HF medication regimen - Patient verbalizes understanding that over the next few months, these medication doses may change and more medications may be added to optimize HF regimen - Patient has been educated on basic disease state pathophysiology and goals of therapy   Duwaine Plant, PharmD, BCPS Heart Failure Stewardship Pharmacist Phone [phone]

## 2024-07-25 ENCOUNTER — Encounter (HOSPITAL_COMMUNITY): Payer: Self-pay | Admitting: Internal Medicine

## 2024-07-25 DIAGNOSIS — I5021 Acute systolic (congestive) heart failure: Secondary | ICD-10-CM

## 2024-07-25 DIAGNOSIS — I2489 Other forms of acute ischemic heart disease: Secondary | ICD-10-CM

## 2024-07-25 DIAGNOSIS — I4819 Other persistent atrial fibrillation: Secondary | ICD-10-CM

## 2024-07-25 DIAGNOSIS — I5023 Acute on chronic systolic (congestive) heart failure: Secondary | ICD-10-CM | POA: Diagnosis not present

## 2024-07-25 DIAGNOSIS — K7031 Alcoholic cirrhosis of liver with ascites: Secondary | ICD-10-CM | POA: Diagnosis not present

## 2024-07-25 DIAGNOSIS — I4891 Unspecified atrial fibrillation: Secondary | ICD-10-CM | POA: Insufficient documentation

## 2024-07-25 LAB — BASIC METABOLIC PANEL WITH GFR
Anion gap: 12 (ref 5–15)
BUN: 19 mg/dL (ref 8–23)
CO2: 24 mmol/L (ref 22–32)
Calcium: 8.8 mg/dL — ABNORMAL LOW (ref 8.9–10.3)
Chloride: 101 mmol/L (ref 98–111)
Creatinine, Ser: 1.39 mg/dL — ABNORMAL HIGH (ref 0.61–1.24)
GFR, Estimated: 58 mL/min — ABNORMAL LOW (ref >60)
Glucose, Bld: 141 mg/dL — ABNORMAL HIGH (ref 70–99)
Potassium: 3.6 mmol/L (ref 3.5–5.1)
Sodium: 137 mmol/L (ref 135–145)

## 2024-07-25 MED ORDER — DIGOXIN 125 MCG PO TABS
0.1250 mg | ORAL_TABLET | Freq: Every day | ORAL | Status: DC
  Administered 2024-07-25 – 2024-08-01 (×8): 0.125 mg via ORAL
  Filled 2024-07-25 (×8): qty 1

## 2024-07-25 MED ORDER — SIMETHICONE 80 MG PO CHEW
80.0000 mg | CHEWABLE_TABLET | Freq: Four times a day (QID) | ORAL | Status: DC | PRN
  Administered 2024-07-25 (×2): 80 mg via ORAL
  Filled 2024-07-25 (×2): qty 1

## 2024-07-25 MED ORDER — METOPROLOL SUCCINATE ER 25 MG PO TB24
25.0000 mg | ORAL_TABLET | Freq: Two times a day (BID) | ORAL | Status: DC
Start: 2024-07-25 — End: 2024-07-25

## 2024-07-25 MED ORDER — SODIUM CHLORIDE 0.9 % IV SOLN: INTRAVENOUS | Status: DC

## 2024-07-25 MED ORDER — FUROSEMIDE 40 MG PO TABS
40.0000 mg | ORAL_TABLET | Freq: Every day | ORAL | Status: DC
  Administered 2024-07-26: 40 mg via ORAL
  Filled 2024-07-25: qty 1

## 2024-07-25 MED ORDER — METOPROLOL SUCCINATE ER 25 MG PO TB24
25.0000 mg | ORAL_TABLET | Freq: Every day | ORAL | Status: DC
  Administered 2024-07-26: 25 mg via ORAL
  Filled 2024-07-25: qty 1

## 2024-07-25 MED ORDER — HEPARIN (PORCINE) 25000 UT/250ML-% IV SOLN
1250.0000 [IU]/h | INTRAVENOUS | Status: DC
  Administered 2024-07-25: 1250 [IU]/h via INTRAVENOUS
  Filled 2024-07-25: qty 250

## 2024-07-25 NOTE — Progress Notes (Signed)
 Progress Note   Patient: Benjamin Abbott FMW:[id number] DOB: 04-Apr-1961 DOA: 07/21/2024     3 DOS: the patient was seen and examined on 07/25/2024   Brief hospital course: The patient is a 63 yr old man who presented to the ED at [facility] for complaints of abdominal bloated and swelling in h is lower extremities as well as shortness of breath. In the ED the patient was found to have a BNP of 2000, a new finding of atrial fibrillation with rate control, and pulmonary vascular congestion. He also was found to have a protuberant abdomen with swelling in his legs bilaterally.   CXR demonstrated pulmonary vascular congestion, CT of the abdomen and pelvis demonstrated ascites and hepatic cirrhosis.  The patient was transferred to [facility] for admission. Echocatdiogram was obtained that demonstrated severe systolic CHF with EF of 25%. The heart failure team has been consulted and the patient has been started on GDMT. He is being diuresed.  Assessment and Plan: Volume overload Patient with pulmonary vascular congestion, lower extremity edema and ascites on presentation to the ED. He states that all of this started in the last week. The patient is receiving lasix 40 mg bid IV for diuresis.  Essential hypertension Blood pressure upon presentation was elevated at 152/108. The patient is on losartan , metoprolol, and amlodipine  as outpatient. Amlodipine  has been held.   Acute exacerbation of CHF (congestive heart failure) (HCC) BNP is elevated at 2000, CXR demonstrates pulmonary vascular congestion, and the patient has lower extremity edema. Echocardiogram has been performed and has demonstrated an EF of 25%. The heart failure team has been consulted.   Tobacco dependence Patient states that he no longer smokes tobacco. He does smoke pot.  Asthma Noted. No wheezing presently. As needed albuterol  is available.  Paroxysmal atrial fibrillation (HCC) New finding of atrial fibrillation. Rate is controlled.    Hepatic cirrhosis (HCC) Finding on CT of the abdomen and pelvis. The patient states that he had been a heavy drinker up until a hear ago. He now states that he only drinks a couple of times a week. CT identifies a cirrhotic liver and minimal ascites.        Subjective: The patient states that he is feeling somewhat better.  Physical Exam: Vitals:   07/24/24 2239 07/25/24 0029 07/25/24 0433 07/25/24 0451  BP: 130/88 (!) 108/91 (!) 163/130 (!) 124/93  Pulse:  89 72   Resp: 15 18 20    Temp:  97.6 F (36.4 C) 97.8 F (36.6 C)   TempSrc:  Oral Oral   SpO2:  100% 94%   Weight:   85.8 kg   Height:       Exam:  Constitutional:  The patient is awake, alert, and oriented x 3. No acute distress. Eyes:  pupils and irises appear normal Normal lids and conjunctivae ENMT:  grossly normal hearing  Lips appear normal external ears, nose appear normal Oropharynx: mucosa, tongue,posterior pharynx appear normal Neck:  neck appears normal, no masses, normal ROM, supple no thyromegaly Respiratory:  No increased work of breathing. No wheezes, rales, or rhonchi No tactile fremitus Cardiovascular:  Regular rate and rhythm No murmurs, ectopy, or gallups. No lateral PMI. No thrills. Abdomen:  Abdomen is soft, non-tender, non-distended No hernias, masses, or organomegaly Normoactive bowel sounds.  Musculoskeletal:  No cyanosis, clubbing Positive for lower extremity edema Skin:  No rashes, lesions, ulcers palpation of skin: no induration or nodules Neurologic:  CN 2-12 intact Sensation all 4 extremities intact Psychiatric:  Mental status  Mood, affect appropriate Orientation to person, place, time  judgment and insight appear intact  Data Reviewed:  CBC, BMP  Family Communication: None available  Disposition: Status is: Inpatient Remains inpatient appropriate because: new diagnosis of severe CHF requiring subspecialty consult and volume management  Planned Discharge  Destination: Home    Time spent: 34 minutes  Author: Merelyn Klump, DO 07/24/2024 4:32 PM  For on call review [email or web address].

## 2024-07-25 NOTE — Progress Notes (Signed)
 Progress Note   Patient: Benjamin Abbott FMW:[id number] DOB: 30-Jan-1961 DOA: 07/21/2024     3 DOS: the patient was seen and examined on 07/25/2024   Brief hospital course: The patient is a 63 yr old man who presented to the ED at [facility] for complaints of abdominal bloated and swelling in h is lower extremities as well as shortness of breath. In the ED the patient was found to have a BNP of 2000, a new finding of atrial fibrillation with rate control, and pulmonary vascular congestion. He also was found to have a protuberant abdomen with swelling in his legs bilaterally.   CXR demonstrated pulmonary vascular congestion, CT of the abdomen and pelvis demonstrated ascites and hepatic cirrhosis.  The patient was transferred to [facility] for admission. Echocatdiogram was obtained that demonstrated severe systolic CHF with EF of 25%. The heart failure team has been consulted and the patient has been started on GDMT. He is being diuresed.  Assessment and Plan: Volume overload Patient with pulmonary vascular congestion, lower extremity edema and ascites on presentation to the ED. He states that all of this started in the last week. The patient is receiving lasix 40 mg bid IV for diuresis.  Essential hypertension Blood pressure upon presentation was elevated at 152/108. The patient is on losartan , metoprolol, and amlodipine  as outpatient. Amlodipine  has been held.   Acute exacerbation of CHF (congestive heart failure) (HCC) BNP is elevated at 2000, CXR demonstrates pulmonary vascular congestion, and the patient has lower extremity edema. Echocardiogram has been performed and has demonstrated an EF of 25%. The heart failure team has been consulted.   Tobacco dependence Patient states that he no longer smokes tobacco. He does smoke pot.  Asthma Noted. No wheezing presently. As needed albuterol  is available.  Paroxysmal atrial fibrillation (HCC) New finding of atrial fibrillation. Rate is controlled.    Hepatic cirrhosis (HCC) Finding on CT of the abdomen and pelvis. The patient states that he had been a heavy drinker up until a hear ago. He now states that he only drinks a couple of times a week. CT identifies a cirrhotic liver and minimal ascites.        Subjective: The patient states that he is feeling somewhat better.  Physical Exam: Vitals:   07/24/24 2239 07/25/24 0029 07/25/24 0433 07/25/24 0451  BP: 130/88 (!) 108/91 (!) 163/130 (!) 124/93  Pulse:  89 72   Resp: 15 18 20    Temp:  97.6 F (36.4 C) 97.8 F (36.6 C)   TempSrc:  Oral Oral   SpO2:  100% 94%   Weight:   85.8 kg   Height:       Exam:  Constitutional:  The patient is awake, alert, and oriented x 3. No acute distress. Eyes:  pupils and irises appear normal Normal lids and conjunctivae ENMT:  grossly normal hearing  Lips appear normal external ears, nose appear normal Oropharynx: mucosa, tongue,posterior pharynx appear normal Neck:  neck appears normal, no masses, normal ROM, supple no thyromegaly Respiratory:  No increased work of breathing. No wheezes, rales, or rhonchi No tactile fremitus Cardiovascular:  Regular rate and rhythm No murmurs, ectopy, or gallups. No lateral PMI. No thrills. Abdomen:  Abdomen is soft, non-tender, non-distended No hernias, masses, or organomegaly Normoactive bowel sounds.  Musculoskeletal:  No cyanosis, clubbing Positive for lower extremity edema Skin:  No rashes, lesions, ulcers palpation of skin: no induration or nodules Neurologic:  CN 2-12 intact Sensation all 4 extremities intact Psychiatric:  Mental status  Mood, affect appropriate Orientation to person, place, time  judgment and insight appear intact  Data Reviewed:  CBC, BMP  Family Communication: None available  Disposition: Status is: Inpatient Remains inpatient appropriate because: new diagnosis of severe CHF requiring subspecialty consult and volume management  Planned Discharge  Destination: Home    Time spent: 38 minutes  Author: Velisa Regnier, DO 07/23/2024 7:12 PM  For on call review [email or web address].

## 2024-07-25 NOTE — Progress Notes (Signed)
 Progress Note   Patient: Benjamin Abbott FMW:[id number] DOB: 12-May-1961 DOA: 07/21/2024     3 DOS: the patient was seen and examined on 07/25/2024   Brief hospital course: The patient is a 63 yr old man who presented to the ED at [facility] for complaints of abdominal bloated and swelling in h is lower extremities as well as shortness of breath. In the ED the patient was found to have a BNP of 2000, a new finding of atrial fibrillation with rate control, and pulmonary vascular congestion. He also was found to have a protuberant abdomen with swelling in his legs bilaterally.   CXR demonstrated pulmonary vascular congestion, CT of the abdomen and pelvis demonstrated ascites and hepatic cirrhosis.  The patient was transferred to [facility] for admission. Echocatdiogram was obtained that demonstrated severe systolic CHF with EF of 25%. The heart failure team has been consulted and the patient has been started on GDMT. He is being diuresed.  Cardiology was consulted. They have recommended a right and a left heart catheterization, but want the patient to undergo EGD first to rule out any potential issue with anticoagulation or antithrombotics should they become necessary. Dr. Rosalie will see the patient.  Assessment and Plan: Volume overload Patient with pulmonary vascular congestion, lower extremity edema and ascites on presentation to the ED. He states that all of this started in the last week. The patient now appears euvolemic. The recommendation from cardiology is to hold lasix.  Essential hypertension Blood pressure upon presentation was elevated at 152/108. The patient is on losartan , metoprolol, and amlodipine  as outpatient. Amlodipine  has been held.   Acute exacerbation of CHF (congestive heart failure) (HCC) BNP is elevated at 2000, CXR demonstrates pulmonary vascular congestion, and the patient has lower extremity edema. Echocardiogram has been performed and has demonstrated an EF of 25%. The heart  failure team has been consulted. Recommendation is to hold further diuresis. They plan to do a right and left heart catheterization, but want and EGD performed first to rule out any bleeding source that would preclude the use of anticoagulation or antithrombotics should they become necessary.  Tobacco dependence Patient states that he no longer smokes tobacco. He does smoke pot.  Asthma Noted. No wheezing presently. As needed albuterol  is available.  Paroxysmal atrial fibrillation (HCC) New finding of atrial fibrillation. Rate is controlled. New onset. Cardiology is considering TEE and cardioversion.   Hepatic cirrhosis (HCC) Finding on CT of the abdomen and pelvis. The patient states that he had been a heavy drinker up until a hear ago. He now states that he only drinks a couple of times a week. CT identifies a cirrhotic liver and minimal ascites.       Subjective: The patient states that he is feeling somewhat better.  Physical Exam: Vitals:   07/25/24 0739 07/25/24 1137 07/25/24 1405 07/25/24 1621  BP: 114/89 (!) 117/91  102/66  Pulse: 80 94 94 94  Resp: 20 20  18   Temp: 97.9 F (36.6 C) 97.8 F (36.6 C)  97.6 F (36.4 C)  TempSrc: Oral Oral  Oral  SpO2: 97% 98%  100%  Weight:      Height:       Exam:  Constitutional:  The patient is awake, alert, and oriented x 3. No acute distress. Eyes:  pupils and irises appear normal Normal lids and conjunctivae ENMT:  grossly normal hearing  Lips appear normal external ears, nose appear normal Oropharynx: mucosa, tongue,posterior pharynx appear normal Neck:  neck appears normal,  no masses, normal ROM, supple no thyromegaly Respiratory:  No increased work of breathing. No wheezes, rales, or rhonchi No tactile fremitus Cardiovascular:  Regular rate and rhythm No murmurs, ectopy, or gallups. No lateral PMI. No thrills. Abdomen:  Abdomen is soft, non-tender, non-distended No hernias, masses, or  organomegaly Normoactive bowel sounds.  Musculoskeletal:  No cyanosis, clubbing Positive for lower extremity edema Skin:  No rashes, lesions, ulcers palpation of skin: no induration or nodules Neurologic:  CN 2-12 intact Sensation all 4 extremities intact Psychiatric:  Mental status Mood, affect appropriate Orientation to person, place, time  judgment and insight appear intact  Data Reviewed:  CBC, BMP  Family Communication: None available  Disposition: Status is: Inpatient Remains inpatient appropriate because: new diagnosis of severe CHF requiring subspecialty consult and volume management  Planned Discharge Destination: Home    Time spent: 34 minutes  Author: Taylormarie Register, DO 07/25/2024 4:48 PM  For on call review [email or web address].

## 2024-07-25 NOTE — Consult Note (Signed)
 Reason for Consult: Cirrhosis on CT Referring Physician: Hospital team  Benjamin Abbott is an 63 y.o. male.  HPI: Patient seen at the request of cardiology and the hospital team for a preprocedure endoscopy to rule out varices there was significant portal gastropathy although he has a significant alcohol history he has not had any previous liver problems and his family history is negative for any liver disease but he is cut way back in the last few years and drinking and had a colonoscopy in 23 but has never had an endoscopy and he really has no GI symptoms and is not on any blood thinners  Past Medical History:  Diagnosis Date   Arthritis    Asthma    Back pain    Chronic pain    Colon cancer (HCC)    COPD (chronic obstructive pulmonary disease) (HCC)    Dyspnea    with exertion   GERD (gastroesophageal reflux disease)    Hypertension    Knee pain    Neuropathy    Obesity    Sleep apnea    cpap    Past Surgical History:  Procedure Laterality Date   ARTHROSCOPY WITH ANTERIOR CRUCIATE LIGAMENT (ACL) REPAIR WITH ANTERIOR TIBILIAS GRAFT Right    BOWEL RESECTION     KNEE ARTHROSCOPY WITH LATERAL MENISECTOMY Left 06/03/2022   Procedure: KNEE ARTHROSCOPY WITH PARTIAL  LATERAL MENISECTOMY;  Surgeon: Sharl Selinda Dover, MD;  Location: [facility];  Service: Orthopedics;  Laterality: Left;    Family History  Problem Relation Age of Onset   Heart attack Mother    Heart attack Father     Social History:  reports that he quit smoking about 4 years ago. His smoking use included cigarettes. He started smoking about 40 years ago. He has a 18.2 pack-year smoking history. He has never used smokeless tobacco. He reports current alcohol use of about 3.0 standard drinks of alcohol per week. He reports current drug use. Drug: Marijuana.  Allergies:  Allergies  Allergen Reactions   Gadolinium Derivatives Shortness Of Breath    Bronchospasms-Pt asthmatic and had albuterol  inhaler, which he used and  it broke the spasms. 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,RN    Medications: I have reviewed the patient's current medications.  Results for orders placed or performed during the hospital encounter of 07/21/24 (from the past 48 hours)  CBC with Differential/Platelet     Status: Abnormal   Collection Time: 07/24/24  2:19 AM  Result Value Ref Range   WBC 8.0 4.0 - 10.5 K/uL   RBC 4.82 4.22 - 5.81 MIL/uL   Hemoglobin 13.5 13.0 - 17.0 g/dL   HCT 59.8 60.9 - 47.9 %   MCV 83.2 80.0 - 100.0 fL   MCH 28.0 26.0 - 34.0 pg   MCHC 33.7 30.0 - 36.0 g/dL   RDW 84.1 (H) 88.4 - 84.4 %   Platelets 199 150 - 400 K/uL   nRBC 0.0 0.0 - 0.2 %   Neutrophils Relative % 59 %   Neutro Abs 4.7 1.7 - 7.7 K/uL   Lymphocytes Relative 27 %   Lymphs Abs 2.2 0.7 - 4.0 K/uL   Monocytes Relative 11 %   Monocytes Absolute 0.8 0.1 - 1.0 K/uL   Eosinophils Relative 3 %   Eosinophils Absolute 0.3 0.0 - 0.5 K/uL   Basophils Relative 0 %   Basophils Absolute 0.0 0.0 - 0.1 K/uL   Immature Granulocytes 0 %   Abs Immature Granulocytes  0.03 0.00 - 0.07 K/uL    Comment: Performed at [facility], [address], [place], [place] [address]  Basic metabolic panel     Status: Abnormal   Collection Time: 07/24/24  2:19 AM  Result Value Ref Range   Sodium 135 135 - 145 mmol/L   Potassium 3.4 (L) 3.5 - 5.1 mmol/L   Chloride 103 98 - 111 mmol/L   CO2 24 22 - 32 mmol/L   Glucose, Bld 120 (H) 70 - 99 mg/dL    Comment: Glucose reference range applies only to samples taken after fasting for at least 8 hours.   BUN 20 8 - 23 mg/dL   Creatinine, Ser 8.64 (H) 0.61 - 1.24 mg/dL   Calcium 8.7 (L) 8.9 - 10.3 mg/dL   GFR, Estimated 60 (L) >60 mL/min    Comment: (NOTE) Calculated using the CKD-EPI Creatinine Equation (2021)    Anion gap 8 5 - 15    Comment: Performed at [facility], [address] [place], [place] [address]  Basic metabolic panel     Status: Abnormal   Collection Time:  07/25/24  2:22 AM  Result Value Ref Range   Sodium 137 135 - 145 mmol/L   Potassium 3.6 3.5 - 5.1 mmol/L   Chloride 101 98 - 111 mmol/L   CO2 24 22 - 32 mmol/L   Glucose, Bld 141 (H) 70 - 99 mg/dL    Comment: Glucose reference range applies only to samples taken after fasting for at least 8 hours.   BUN 19 8 - 23 mg/dL   Creatinine, Ser 8.60 (H) 0.61 - 1.24 mg/dL   Calcium 8.8 (L) 8.9 - 10.3 mg/dL   GFR, Estimated 58 (L) >60 mL/min    Comment: (NOTE) Calculated using the CKD-EPI Creatinine Equation (2021)    Anion gap 12 5 - 15    Comment: Performed at [facility], [address] [place], [place] [address]    DG Abd 1 View Result Date: 07/24/2024 EXAM: 1 VIEW XRAY OF THE ABDOMEN 07/24/2024 11:38:06 PM COMPARISON: CT 07/21/2024 CLINICAL HISTORY: Abdominal pain, distention FINDINGS: BOWEL: Significant gaseous distension of the stomach and bowel loops which appear to predominantly reflect small bowel. Cannot exclude distal small bowel obstruction. SOFT TISSUES: No opaque urinary calculi. BONES: No acute osseous abnormality. IMPRESSION: 1. Gaseous distension of the stomach and predominantly small bowel concerning for distal small bowel obstruction. Electronically signed by: Franky Crease MD 07/24/2024 11:44 PM EDT RP Workstation: [id number]    ROS negative except above Blood pressure (!) 117/91, pulse 94, temperature 97.8 F (36.6 C), temperature source Oral, resp. rate 20, height 5' 9 (1.753 m), weight 85.8 kg, SpO2 98%. Physical Exam vital signs stable afebrile no acute distress abdomen is soft maybe a touch of ascites nontender CT reviewed no obvious varices mentioned LFTs minimally increased no INR but platelet count okay  Assessment/Plan: Cirrhosis on CT scan Plan: Risk benefits methods of endoscopy and we compared to the colonoscopy has had and will proceed tomorrow with anesthesia assistance just to rule out significant endoscopic liver disease  Benjamin Abbott E 07/25/2024,  3:54 PM

## 2024-07-25 NOTE — Anesthesia Preprocedure Evaluation (Signed)
 Anesthesia Evaluation  Patient identified by MRN, date of birth, ID band Patient awake    Reviewed: Allergy & Precautions, NPO status , Patient's Chart, lab work & pertinent test results  Airway Mallampati: II  TM Distance: >3 FB Neck ROM: Full    Dental no notable dental hx. (+) Teeth Intact   Pulmonary asthma , Patient abstained from smoking., former smoker   Pulmonary exam normal breath sounds clear to auscultation       Cardiovascular hypertension, (-) angina +CHF  (-) Past MI Normal cardiovascular exam Rhythm:Regular Rate:Normal  07/23/2024 TTE  1. Left ventricular ejection fraction, by estimation, is 25 to 30%. The  left ventricle has severely decreased function. The left ventricle  demonstrates regional wall motion abnormalities (see scoring  diagram/findings for description). The left  ventricular internal cavity size was moderately dilated. There is mild  left ventricular hypertrophy. Left ventricular diastolic parameters are  indeterminate. LAD akinesis with no LV thrombus.   2. Right ventricular systolic function is normal. The right ventricular  size is mildly enlarged. There is normal pulmonary artery systolic  pressure.   3. Left atrial size was mildly dilated.   4. Right atrial size was moderately dilated.   5. The mitral valve is normal in structure. Moderate to severe mitral  valve regurgitation. No evidence of mitral stenosis.   6. Tricuspid valve regurgitation is moderate to severe.   7. The aortic valve is tricuspid. Aortic valve regurgitation is trivial.  No aortic stenosis is present.   8. Severely dilated pulmonary artery.   9. The inferior vena cava is normal in size with greater than 50%  respiratory variability, suggesting right atrial pressure of 3 mmHg.      Neuro/Psych    GI/Hepatic ,GERD  ,,(+) Cirrhosis         Endo/Other    Renal/GU Lab Results                   K                         3.6                 07/25/2024                    CREATININE               1.39 (H)            07/25/2024                          Musculoskeletal   Abdominal   Peds  Hematology Lab Results      Component                Value               Date                      WBC                      8.0                 07/24/2024                HGB                      13.5  07/24/2024                HCT                      40.1                07/24/2024                MCV                      83.2                07/24/2024                PLT                      199                 07/24/2024              Anesthesia Other Findings   Reproductive/Obstetrics                              Anesthesia Physical Anesthesia Plan  ASA: 4  Anesthesia Plan: MAC   Post-op Pain Management: Minimal or no pain anticipated   Induction: Intravenous  PONV Risk Score and Plan: Treatment may vary due to age or medical condition and Propofol  infusion  Airway Management Planned: Natural Airway and Nasal Cannula  Additional Equipment: None  Intra-op Plan:   Post-operative Plan:   Informed Consent: I have reviewed the patients History and Physical, chart, labs and discussed the procedure including the risks, benefits and alternatives for the proposed anesthesia with the patient or authorized representative who has indicated his/her understanding and acceptance.     Dental advisory given  Plan Discussed with: CRNA and Surgeon  Anesthesia Plan Comments: (EGD for Cirrhosis)         Anesthesia Quick Evaluation

## 2024-07-25 NOTE — Progress Notes (Signed)
   Heart Failure Stewardship Pharmacist Progress Note   PCP: Leontine Cramp, NP PCP-Cardiologist: Alvan Ronal BRAVO, MD (Inactive)    HPI:  63 yo M with PMH of HTN, colon cancer, COPD, history of tobacco use, and history of heavy alcohol use.   Presented to the ED on 10/26 with shortness of breath, LE edema, abdominal pain and distention for two weeks. CXR with cardiomegaly and central venous congestion. BNP [id number]. Found to be in rate controlled afib. ECHO 10/28 with LVEF 25-30%, RWMA, mild LVH, RV normal, moderate to severe MR, severely dilated pulmonary artery.   Reports chest tightness and shortness of breath. LE edema improved. Still has abdominal distention. Cardiology consulted today. Continued HF education with diet/fluids and medications.   Current HF Medications: Diuretic: furosemide 40 mg IV BID Beta Blocker: metoprolol tartrate 25 mg BID ACE/ARB/ARNI: losartan  25 mg daily MRA: spironolactone 25 mg daily SGLT2i: Jardiance 10 mg daily  Prior to admission HF Medications: ACE/ARB/ARNI: losartan  50 mg daily  Pertinent Lab Values: Serum creatinine 1.39, BUN 19, Potassium 3.6, Sodium 137, BNP 1405, Magnesium 1.9  Vital Signs: Weight: 189 lbs (admission weight: 199 lbs) Blood pressure: 110-120/80s  Heart rate: 80-90s  I/O: net -4.1L yesterday; net -15.2L since admission  Medication Assistance / Insurance Benefits Check: Does the patient have prescription insurance?  Yes Type of insurance plan: UHC Medicare + Medicaid  Outpatient Pharmacy:  Prior to admission outpatient pharmacy: [facility] Is the patient willing to use [facility] TOC pharmacy at discharge? Yes Is the patient willing to transition their outpatient pharmacy to utilize a [facility] outpatient pharmacy?   No   Assessment: 1. Acute systolic CHF (LVEF 25-30%), due to unknown etiology - recommend ischemic evaluation. NYHA class III symptoms. - Continue furosemide 40 mg IV BID. Strict I/Os and daily weights.  Keep K>4 and Mg>2. Recommend adding KCl 40 mEq x 1. - Continue metoprolol tartrate 25 mg BID - will need to transition to metoprolol succinate prior to discharge.  - On losartan  25 mg daily - consider transition to Entresto 24/26 mg BID - Continue spironolactone 25 mg daily - Continue Jardiance 10 mg daily   Plan: 1) Medication changes recommended at this time: - Transition to metoprolol succinate prior to discharge - Transition losartan  to Entresto 24/26 mg BID - Add KCl 40 mEq x 1  2) Patient assistance: - Entresto copay $0 - Farxiga/Jardiance copay $0  3)  Education  - Patient has been educated on current HF medications and potential additions to HF medication regimen - Patient verbalizes understanding that over the next few months, these medication doses may change and more medications may be added to optimize HF regimen - Patient has been educated on basic disease state pathophysiology and goals of therapy   Duwaine Plant, PharmD, BCPS Heart Failure Stewardship Pharmacist Phone [phone]

## 2024-07-25 NOTE — TOC CM/SW Note (Signed)
 Transition of Care [facility]) - Inpatient Brief Assessment   Patient Details  Name: Areli Jowett MRN: [id number] Date of Birth: [birth year]  Transition of [facility]) CM/SW Contact:    Waddell Barnie Rama, RN Phone Number: 07/25/2024, 2:08 PM   Clinical Narrative: From home alone,  has PCP and insurance on file, states has no HH services in place at this time or DME at home.  States a friend will transport them home at [employer], states gets medications from [employer]  Pta self ambulatory.   There are no ICM  needs identified  at this time.  Please place consult for ICM needs.  Co pay for entresto and bernadine will be zero dollars per pharmacy.   Transition of Care Asessment: Insurance and Status: Insurance coverage has been reviewed Patient has primary care physician: Yes Home environment has been reviewed: home alone Prior level of function:: indep Prior/Current Home Services: No current home services Social Drivers of Health Review: SDOH reviewed no interventions necessary Readmission risk has been reviewed: Yes Transition of care needs: no transition of care needs at this time

## 2024-07-25 NOTE — Progress Notes (Signed)
 PHARMACY - ANTICOAGULATION CONSULT NOTE  Pharmacy Consult for heparin Indication: atrial fibrillation  Allergies  Allergen Reactions   Gadolinium Derivatives Shortness Of Breath    Bronchospasms-Pt asthmatic and had albuterol  inhaler, which he used and it broke the spasms. 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,RN    Patient Measurements: Height: 5' 9 (175.3 cm) Weight: 85.8 kg (189 lb 1.6 oz) IBW/kg (Calculated) : 70.7 HEPARIN DW (KG): 86.2  Vital Signs: Temp: 97.8 F (36.6 C) (10/30 1137) Temp Source: Oral (10/30 1137) BP: 117/91 (10/30 1137) Pulse Rate: 94 (10/30 1137)  Labs: Recent Labs    07/22/24 1844 07/23/24 0221 07/24/24 0219 07/25/24 0222  HGB 14.0  --  13.5  --   HCT 41.9  --  40.1  --   PLT 214  --  199  --   CREATININE 1.20 1.16 1.35* 1.39*    Estimated Creatinine Clearance: 60.5 mL/min (A) (by C-G formula based on SCr of 1.39 mg/dL (H)).   Medical History: Past Medical History:  Diagnosis Date   Arthritis    Asthma    Back pain    Chronic pain    Colon cancer (HCC)    COPD (chronic obstructive pulmonary disease) (HCC)    Dyspnea    with exertion   GERD (gastroesophageal reflux disease)    Hypertension    Knee pain    Neuropathy    Obesity    Sleep apnea    cpap      Assessment: 20 yoM admitted with new CHF. Pt on apixaban PTA for hx AF, will switch to IV heparin with need for cath. Last dose of apixaban was ~0915 10/30, will need to dose via aPTTs initially.  Goal of Therapy:  Heparin level 0.3-0.7 units/ml aPTT 66-102 seconds Monitor platelets by anticoagulation protocol: Yes   Plan:  Heparin 1250 units/h no bolus at 2130 Check 6h heparin level, aPTT  Ozell Jamaica, PharmD, BCPS, [facility] Clinical Pharmacist [phone] Please check AMION for all [facility] Pharmacy numbers 07/25/2024

## 2024-07-25 NOTE — Consult Note (Addendum)
 Cardiology Consultation   Patient ID: Benjamin Abbott MRN: [id number]; DOB: 05/22/1961  Admit date: 07/21/2024 Date of Consult: 07/25/2024  PCP:  Leontine Cramp, NP   [facility] Providers Cardiologist:  Darryle ONEIDA Decent, MD      Patient Profile: Benjamin Abbott is a 63 y.o. male with a hx of  marijuana abuse, alcohol abuse, hypertension, COPD, OSA, and colon cancer s/p colostomy who is being seen 07/25/2024 for the evaluation of heart failure at the request of Ava Swayze.  History of Present Illness: Benjamin Abbott is a 63 year old male with prior cardiac history listed below  The patient was seen by Dr. Ronal Ross on 04/2022 for a preoperative risk assessment.  During that visit the patient's blood pressure was elevated and he was started on losartan .  Patient had previously had EKG inferior lateral T wave inversions.  But the patient denied chest pain, dyspnea on exertion or worsening shortness of breath so no further ischemic evaluation was done.  On 10/2022 patient was hospitalized for a fall.  The patient was acutely intoxicated with alcohol, cocaine, and marijuana at that visit.  Presented to the emergency department on 07/21/24 complaining of abdominal pain and bilateral lower extremity edema for the past 2 weeks.  EKG was done and showed the patient was in atrial fibrillation.  On interview the patient reported that he started getting shortness of breath, dyspnea on exertion, chest tightness and lower extremity edema that started about the 24 September.  Reported that he also has a chest tightness or pressure that is worse with exertion.  Used to take his dog for a walk and go for about 2 miles 14 times a week.  Continues to be unable to walk several feet without becoming short of breath and have chest tightness.  Reports that his lower extremity edema has improved significantly.  Continues to have abdominal distention.  States that he drinks about 2X40s of liquor a week.  Smokes  cannabis but denies nicotine use.  Stated that he quit smoking cigarettes 1 year ago.  Denies cocaine abuse or other illicit substance use.  Labs showed elevated proBNP of 1405, elevated high-sensitivity troponins 35 > 38, negative HIV, potassium of 3.6, sodium of 137, A creatinine of 1.39, hypocalcemia of 8.8, WBC count of 8, and hemoglobin of 13.5.  Patient has not had a UDS.  CT abdomen and pelvis showed coronary calcifications, liver contour suggestive of cirrhosis, small to moderate abdominal ascites, bilateral pleural effusions, small pericardial effusion, postsurgical changes in colon.  Echocardiogram on 07/23/2024 showed a reduced LVEF of 25 to 30%, in the inferior and lateral segment akinesis (felt to be in LAD distribution), mild LVH, normal RV systolic function, mildly dilated left atrium, moderately dilated right atrium, moderate to severe MR, moderate to severe TR, normal pulmonary artery systolic pressure, and IVC with greater than 50% respiratory variability.  EKG showed atrial fibrillation with RVR and a ventricular rate of 118, and LVH.   Past Medical History:  Diagnosis Date   Arthritis    Asthma    Back pain    Chronic pain    Colon cancer (HCC)    COPD (chronic obstructive pulmonary disease) (HCC)    Dyspnea    with exertion   GERD (gastroesophageal reflux disease)    Hypertension    Knee pain    Neuropathy    Obesity    Sleep apnea    cpap    Past Surgical History:  Procedure Laterality Date   ARTHROSCOPY  WITH ANTERIOR CRUCIATE LIGAMENT (ACL) REPAIR WITH ANTERIOR TIBILIAS GRAFT Right    BOWEL RESECTION     KNEE ARTHROSCOPY WITH LATERAL MENISECTOMY Left 06/03/2022   Procedure: KNEE ARTHROSCOPY WITH PARTIAL  LATERAL MENISECTOMY;  Surgeon: Sharl Selinda Dover, MD;  Location: [facility];  Service: Orthopedics;  Laterality: Left;     Home Medications:  Prior to Admission medications   Medication Sig Start Date End Date Taking? Authorizing Provider  albuterol   (PROVENTIL  HFA;VENTOLIN  HFA) 108 (90 Base) MCG/ACT inhaler Inhale 2 puffs into the lungs every 6 (six) hours as needed for wheezing or shortness of breath. 02/28/18  Yes Diallo, Abdoulaye, MD  albuterol  (PROVENTIL ) (2.5 MG/3ML) 0.083% nebulizer solution Take 2.5 mg by nebulization every 6 (six) hours as needed for wheezing or shortness of breath.   Yes [provider]  amLODipine  (NORVASC ) 10 MG tablet TAKE ONE TABLET BY MOUTH DAILY 02/11/19  Yes Diallo, Abdoulaye, MD  celecoxib (CELEBREX) 200 MG capsule Take 200 mg by mouth 2 (two) times daily. 07/25/19  Yes [provider]  cetirizine (ZYRTEC) 10 MG tablet Take 10 mg by mouth daily.   Yes [provider]  Cholecalciferol (VITAMIN D) 50 MCG (2000 UT) tablet Take 2,000 Units by mouth daily.   Yes [provider]  cyclobenzaprine  (FLEXERIL ) 10 MG tablet Take 10 mg by mouth at bedtime as needed for muscle spasms.   Yes [provider]  fluticasone  (FLONASE) 50 MCG/ACT nasal spray Place 1 spray into both nostrils daily as needed for allergies. 02/09/22  Yes [provider]  Fluticasone -Salmeterol (ADVAIR) 250-50 MCG/DOSE AEPB Inhale 1 puff into the lungs 2 (two) times daily. 02/28/18  Yes Diallo, Abdoulaye, MD  gabapentin  (NEURONTIN ) 300 MG capsule Take 1 capsule (300 mg total) by mouth 3 (three) times daily. 06/08/17  Yes Odis Burnard Jansky, PA-C  hydrOXYzine (ATARAX) 50 MG tablet Take 50 mg by mouth at bedtime.   Yes [provider]  lidocaine  (LIDODERM ) 5 % Place 1 patch onto the skin daily. Remove & Discard patch within 12 hours or as directed by MD Patient taking differently: Place 1 patch onto the skin daily as needed (pain). Remove & Discard patch within 12 hours or as directed by MD 11/16/16  Yes Palumbo, April, MD  losartan  (COZAAR ) 50 MG tablet Take 50 mg by mouth daily.   Yes [provider]  meloxicam  (MOBIC ) 15 MG tablet Take 1 tablet (15 mg total) by mouth daily. 06/08/17  Yes  Odis Burnard Jansky, PA-C  montelukast (SINGULAIR) 10 MG tablet Take 10 mg by mouth daily as needed (allergies).   Yes [provider]  ondansetron  (ZOFRAN ) 4 MG tablet Take 1 tablet (4 mg total) by mouth every 8 (eight) hours as needed for nausea or vomiting. 06/03/22  Yes Sharl Selinda Dover, MD  triamcinolone cream (KENALOG) 0.1 % Apply 1 Application topically daily as needed (for rash). 04/07/22  Yes [provider]  hydrOXYzine (ATARAX) 25 MG tablet Take 25 mg by mouth at bedtime. Patient not taking: Reported on 07/22/2024    [provider]  losartan  (COZAAR ) 25 MG tablet Take 1 tablet (25 mg total) by mouth daily. Patient not taking: Reported on 07/22/2024 05/16/22   Alvan Ronal BRAVO, MD  losartan  (COZAAR ) 25 MG tablet Take 25 mg by mouth daily. Pt has not started received script on 05/17/22 per pt. Patient not taking: Reported on 07/22/2024    [provider]  ondansetron  (ZOFRAN ) 4 MG tablet Take 4 mg by mouth  2 (two) times daily as needed for nausea/vomiting. Patient not taking: Reported on 07/22/2024 04/07/22   [provider]    Scheduled Meds:  cholecalciferol  2,000 Units Oral Daily   digoxin  0.125 mg Oral Daily   empagliflozin  10 mg Oral Daily   famotidine  20 mg Oral BID   fluticasone  furoate-vilanterol  1 puff Inhalation Daily   [START ON 07/26/2024] furosemide  40 mg Oral Daily   gabapentin   300 mg Oral TID   hydrOXYzine  25 mg Oral QHS   loratadine  10 mg Oral Daily   losartan   25 mg Oral Daily   methocarbamol   500 mg Oral BID   metoprolol tartrate  25 mg Oral BID   pantoprazole  40 mg Oral Daily   sodium chloride  flush  3 mL Intravenous Q12H   spironolactone  25 mg Oral Daily   Continuous Infusions:  heparin     PRN Meds: acetaminophen , albuterol , fluticasone , HYDROmorphone  (DILAUDID ) injection, montelukast, naLOXone (NARCAN)  injection, ondansetron  (ZOFRAN ) IV, ondansetron , simethicone, sodium chloride  flush  Allergies:     Allergies  Allergen Reactions   Gadolinium Derivatives Shortness Of Breath    Bronchospasms-Pt asthmatic and had albuterol  inhaler, which he used and it broke the spasms. Will need 13 hour prep for furture MRI lillie 06/17/24-AG,RN    Social History:   Social History   Socioeconomic History   Marital status: Single    Spouse name: Not on file   Number of children: 1   Years of education: Not on file   Highest education level: High school graduate  Occupational History   Occupation: Disabilty  Tobacco Use   Smoking status: Former    Current packs/day: 0.00    Average packs/day: 0.5 packs/day for 36.5 years (18.2 ttl pk-yrs)    Types: Cigarettes    Start date: 33    Quit date: 03/18/2020    Years since quitting: 4.3   Smokeless tobacco: Never  Vaping Use   Vaping status: Never Used  Substance and Sexual Activity   Alcohol use: Yes    Alcohol/week: 3.0 standard drinks of alcohol    Types: 3 Cans of beer per week    Comment: occ beer   Drug use: Yes    Types: Marijuana    Comment: daily   Sexual activity: Yes  Other Topics Concern   Not on file  Social History Narrative   Not on file   Social Drivers of Health   Financial Resource Strain: Low Risk  (07/24/2024)   Overall Financial Resource Strain (CARDIA)    Difficulty of Paying Living Expenses: Not very hard  Food Insecurity: No Food Insecurity (07/22/2024)   Hunger Vital Sign    Worried About Running Out of Food in the Last Year: Never true    Ran Out of Food in the Last Year: Never true  Transportation Needs: No Transportation Needs (07/22/2024)   PRAPARE - Administrator, Civil Service (Medical): No    Lack of Transportation (Non-Medical): No  Physical Activity: Not on file  Stress: Not on file  Social Connections: Unknown (11/08/2022)   Received from [facility]   Social Network    Social Network: Not on file  Intimate Partner Violence: Not At Risk (07/22/2024)   Humiliation, Afraid,  Rape, and Kick questionnaire    Fear of Current or Ex-Partner: No    Emotionally Abused: No    Physically Abused: No    Sexually Abused: No  Family History:    Family History  Problem Relation Age of Onset   Heart attack Mother    Heart attack Father      ROS:  Please see the history of present illness.   All other ROS reviewed and negative.     Physical Exam/Data: Vitals:   07/25/24 0433 07/25/24 0451 07/25/24 0739 07/25/24 1137  BP: (!) 163/130 (!) 124/93 114/89 (!) 117/91  Pulse: 72  80 94  Resp: 20  20 20   Temp: 97.8 F (36.6 C)  97.9 F (36.6 C) 97.8 F (36.6 C)  TempSrc: Oral  Oral Oral  SpO2: 94%  97% 98%  Weight: 85.8 kg     Height:        Intake/Output Summary (Last 24 hours) at 07/25/2024 1337 Last data filed at 07/25/2024 1255 Gross per 24 hour  Intake 1731 ml  Output 3500 ml  Net -1769 ml      07/25/2024    4:33 AM 07/24/2024    4:13 AM 07/23/2024    5:13 AM  Last 3 Weights  Weight (lbs) 189 lb 1.6 oz 190 lb 1.6 oz 199 lb 11.8 oz  Weight (kg) 85.775 kg 86.229 kg 90.6 kg     Body mass index is 27.93 kg/m.  General:  Well nourished, well developed, in no acute distress.  Alert and oriented on room air HEENT: normal Neck: no JVD Vascular: No carotid bruits; Distal pulses 2+ bilaterally Cardiac:  normal S1, S2; RRR; no murmur  Lungs:  clear to auscultation bilaterally, diffuse wheezing heard throughout the lungs Abd: Abdomen distended, hard, tender to palpation. Ext: no edema Musculoskeletal:  No deformities, BUE and BLE strength normal and equal Skin: warm and dry  Neuro:  no focal abnormalities noted Psych:  Normal affect   EKG:  The EKG was personally reviewed and demonstrates: Showed atrial fibrillation with RVR and a ventricular rate of 118, and LVH. Telemetry:  Telemetry was personally reviewed and demonstrates:  Atrial fibrillation with heart rates in the 90s to 120.  Has had several runs of NSVT that lasted 3 beats but also has  frequent PVCs.  Had a PVC burden of about 10% over the past 8 hours.  Relevant CV Studies: Echocardiogram on 07/23/2024 IMPRESSIONS     1. Left ventricular ejection fraction, by estimation, is 25 to 30%. The  left ventricle has severely decreased function. The left ventricle  demonstrates regional wall motion abnormalities (see scoring  diagram/findings for description). The left  ventricular internal cavity size was moderately dilated. There is mild  left ventricular hypertrophy. Left ventricular diastolic parameters are  indeterminate. LAD akinesis with no LV thrombus.   2. Right ventricular systolic function is normal. The right ventricular  size is mildly enlarged. There is normal pulmonary artery systolic  pressure.   3. Left atrial size was mildly dilated.   4. Right atrial size was moderately dilated.   5. The mitral valve is normal in structure. Moderate to severe mitral  valve regurgitation. No evidence of mitral stenosis.   6. Tricuspid valve regurgitation is moderate to severe.   7. The aortic valve is tricuspid. Aortic valve regurgitation is trivial.  No aortic stenosis is present.   8. Severely dilated pulmonary artery.   9. The inferior vena cava is normal in size with greater than 50%  respiratory variability, suggesting right atrial pressure of 3 mmHg.   Laboratory Data: High Sensitivity Troponin:  No results for input(s): TROPONINIHS in the last 720 hours.  Chemistry Recent Labs  Lab 07/21/24 1424 07/22/24 1844 07/23/24 0221 07/24/24 0219 07/25/24 0222  NA 139 137 137 135 137  K 4.3 4.3 3.7 3.4* 3.6  CL 111 106 107 103 101  CO2 21* 21* 22 24 24   GLUCOSE 89 102* 97 120* 141*  BUN 23 18 20 20 19   CREATININE 1.24 1.20 1.16 1.35* 1.39*  CALCIUM 9.0 8.7* 8.8* 8.7* 8.8*  MG 2.0 1.9  --   --   --   GFRNONAA >60 >60 >60 60* 58*  ANIONGAP 8 10 8 8 12     Recent Labs  Lab 07/21/24 1424 07/22/24 1844  PROT 5.9* 6.0*  ALBUMIN 3.6 3.2*  AST 44* 46*  ALT  76* 78*  ALKPHOS 76 69  BILITOT 0.7 1.3*   Lipids No results for input(s): CHOL, TRIG, HDL, LABVLDL, LDLCALC, CHOLHDL in the last 168 hours.  Hematology Recent Labs  Lab 07/21/24 1424 07/22/24 1844 07/24/24 0219  WBC 8.2 8.5 8.0  RBC 4.52 5.03 4.82  HGB 12.6* 14.0 13.5  HCT 38.5* 41.9 40.1  MCV 85.2 83.3 83.2  MCH 27.9 27.8 28.0  MCHC 32.7 33.4 33.7  RDW 16.5* 16.6* 15.8*  PLT 187 214 199   Thyroid  No results for input(s): TSH, FREET4 in the last 168 hours.  BNP Recent Labs  Lab 07/21/24 1424 07/22/24 1844  BNP  --  1,405.0*  PROBNP 2,285.0*  --     DDimer No results for input(s): DDIMER in the last 168 hours.  Radiology/Studies:  DG Abd 1 View Result Date: 07/24/2024 EXAM: 1 VIEW XRAY OF THE ABDOMEN 07/24/2024 11:38:06 PM COMPARISON: CT 07/21/2024 CLINICAL HISTORY: Abdominal pain, distention FINDINGS: BOWEL: Significant gaseous distension of the stomach and bowel loops which appear to predominantly reflect small bowel. Cannot exclude distal small bowel obstruction. SOFT TISSUES: No opaque urinary calculi. BONES: No acute osseous abnormality. IMPRESSION: 1. Gaseous distension of the stomach and predominantly small bowel concerning for distal small bowel obstruction. Electronically signed by: Franky Crease MD 07/24/2024 11:44 PM EDT RP Workstation: [id number]   ECHOCARDIOGRAM COMPLETE Result Date: 07/23/2024    ECHOCARDIOGRAM REPORT   Patient Name:   Benjamin Abbott Date of Exam: 07/23/2024 Medical Rec #:  [id number]      Height:       69.0 in Accession #:    [id number]     Weight:       199.7 lb Date of Birth:  July 21, 1961       BSA:          2.065 m Patient Age:    63 years       BP:           135/110 mmHg Patient Gender: M              HR:           94 bpm. Exam Location:  Inpatient Procedure: 2D Echo and Intracardiac Opacification Agent (Both Spectral and Color            Flow Doppler were utilized during procedure). Indications:    CHF  History:        Patient  has no prior history of Echocardiogram examinations.                 CHF.  Sonographer:    Norleen Amour Referring Phys: BRIGIDA [facility] IMPRESSIONS  1. Left ventricular ejection fraction, by estimation, is 25 to 30%. The left ventricle has severely decreased function. The left ventricle demonstrates regional  wall motion abnormalities (see scoring diagram/findings for description). The left ventricular internal cavity size was moderately dilated. There is mild left ventricular hypertrophy. Left ventricular diastolic parameters are indeterminate. LAD akinesis with no LV thrombus.  2. Right ventricular systolic function is normal. The right ventricular size is mildly enlarged. There is normal pulmonary artery systolic pressure.  3. Left atrial size was mildly dilated.  4. Right atrial size was moderately dilated.  5. The mitral valve is normal in structure. Moderate to severe mitral valve regurgitation. No evidence of mitral stenosis.  6. Tricuspid valve regurgitation is moderate to severe.  7. The aortic valve is tricuspid. Aortic valve regurgitation is trivial. No aortic stenosis is present.  8. Severely dilated pulmonary artery.  9. The inferior vena cava is normal in size with greater than 50% respiratory variability, suggesting right atrial pressure of 3 mmHg. Comparison(s): No prior Echocardiogram. Conclusion(s)/Recommendation(s): Reaching out to Dr. [name]. FINDINGS  Left Ventricle: Left ventricular ejection fraction, by estimation, is 25 to 30%. The left ventricle has severely decreased function. The left ventricle demonstrates regional wall motion abnormalities. The left ventricular internal cavity size was moderately dilated. There is mild left ventricular hypertrophy. Left ventricular diastolic parameters are indeterminate.  LV Wall Scoring: The apical lateral segment, apical septal segment, apical anterior segment, and apical inferior segment are akinetic. The anterior wall, antero-lateral wall, anterior  septum, inferior wall, posterior wall, mid inferoseptal segment, and basal inferoseptal segment are hypokinetic. LAD akinesis with no LV thrombus. Right Ventricle: The right ventricular size is mildly enlarged. No increase in right ventricular wall thickness. Right ventricular systolic function is normal. There is normal pulmonary artery systolic pressure. The tricuspid regurgitant velocity is 2.64  m/s, and with an assumed right atrial pressure of 3 mmHg, the estimated right ventricular systolic pressure is 30.9 mmHg. Left Atrium: Left atrial size was mildly dilated. Right Atrium: Right atrial size was moderately dilated. Pericardium: There is no evidence of pericardial effusion. The pericardial effusion is circumferential. Mitral Valve: The mitral valve is normal in structure. Moderate to severe mitral valve regurgitation. No evidence of mitral valve stenosis. Tricuspid Valve: The tricuspid valve is normal in structure. Tricuspid valve regurgitation is moderate to severe. No evidence of tricuspid stenosis. Aortic Valve: The aortic valve is tricuspid. Aortic valve regurgitation is trivial. No aortic stenosis is present. Aortic valve mean gradient measures 5.0 mmHg. Aortic valve peak gradient measures 9.1 mmHg. Aortic valve area, by VTI measures 1.07 cm. Pulmonic Valve: The pulmonic valve was normal in structure. Pulmonic valve regurgitation is mild to moderate. Aorta: The aortic root and ascending aorta are structurally normal, with no evidence of dilitation. Pulmonary Artery: The pulmonary artery is severely dilated. Venous: The inferior vena cava is normal in size with greater than 50% respiratory variability, suggesting right atrial pressure of 3 mmHg. IAS/Shunts: The atrial septum is grossly normal.  LEFT VENTRICLE PLAX 2D LVIDd:         6.20 cm      Diastology LVIDs:         5.30 cm      LV e' medial:    7.89 cm/s LV PW:         1.10 cm      LV E/e' medial:  9.6 LV IVS:        1.40 cm      LV e' lateral:    14.40 cm/s LVOT diam:     1.70 cm      LV E/e' lateral: 5.3 LV SV:  28 LV SV Index:   13 LVOT Area:     2.27 cm  LV Volumes (MOD) LV vol d, MOD A2C: 141.0 ml LV vol d, MOD A4C: 178.0 ml LV vol s, MOD A2C: 119.0 ml LV vol s, MOD A4C: 116.0 ml LV SV MOD A2C:     22.0 ml LV SV MOD A4C:     178.0 ml LV SV MOD BP:      38.6 ml RIGHT VENTRICLE RV Basal diam:  4.30 cm RV S prime:     11.20 cm/s TAPSE (M-mode): 1.5 cm LEFT ATRIUM             Index        RIGHT ATRIUM           Index LA diam:        4.40 cm 2.13 cm/m   RA Area:     24.90 cm LA Vol (A2C):   66.1 ml 32.01 ml/m  RA Volume:   88.70 ml  42.96 ml/m LA Vol (A4C):   65.2 ml 31.58 ml/m LA Biplane Vol: 68.9 ml 33.37 ml/m  AORTIC VALVE                     PULMONIC VALVE AV Area (Vmax):    1.38 cm      PV Vmax:       0.58 m/s AV Area (Vmean):   1.24 cm      PV Peak grad:  1.4 mmHg AV Area (VTI):     1.07 cm AV Vmax:           151.00 cm/s AV Vmean:          106.000 cm/s AV VTI:            0.260 m AV Peak Grad:      9.1 mmHg AV Mean Grad:      5.0 mmHg LVOT Vmax:         91.80 cm/s LVOT Vmean:        58.100 cm/s LVOT VTI:          0.122 m LVOT/AV VTI ratio: 0.47  AORTA Ao Root diam: 3.40 cm Ao Asc diam:  3.50 cm MV E velocity: 75.78 cm/s  TRICUSPID VALVE                            TR Peak grad:   27.9 mmHg                            TR Vmax:        264.00 cm/s                             SHUNTS                            Systemic VTI:  0.12 m                            Systemic Diam: 1.70 cm Stanly Leavens MD Electronically signed by Stanly Leavens MD Signature Date/Time: 07/23/2024/4:31:47 PM    Final    US  ASCITES (ABDOMEN LIMITED) Result Date: 07/22/2024 EXAM: LIMITED ABDOMINAL ULTRASOUND FOR ASCITES EVALUATION TECHNIQUE: Limited real-time sonography of all 4 quadrants of the abdomen was  performed for evaluation of ascites. COMPARISON: CT abdomen pelvis 11/01/2021. CLINICAL HISTORY: Ascites. FINDINGS: RIGHT UPPER QUADRANT: At least  small volume simple free fluid ascites. LEFT UPPER QUADRANT: No ascites seen. RIGHT LOWER QUADRANT: At least small volume simple free fluid ascites. LEFT LOWER QUADRANT: No ascites seen. OTHER: Limited visualization of the rest of the abdomen demonstrates no acute abnormality. IMPRESSION: 1. Small-volume simple ascites in the right upper and right lower quadrants. Electronically signed by: Morgane Naveau MD 07/22/2024 09:36 PM EDT RP Workstation: [id number]   CT ABDOMEN PELVIS W CONTRAST Result Date: 07/21/2024 CLINICAL DATA:  Acute abdominal pain. Left lower quadrant pain. No intra atrial fibrillation. Abdominal swelling. EXAM: CT ABDOMEN AND PELVIS WITH CONTRAST TECHNIQUE: Multidetector CT imaging of the abdomen and pelvis was performed using the standard protocol following bolus administration of intravenous contrast. RADIATION DOSE REDUCTION: This exam was performed according to the departmental dose-optimization program which includes automated exposure control, adjustment of the mA and/or kV according to patient size and/or use of iterative reconstruction technique. CONTRAST:  OMNIPAQUE  IOHEXOL  300 MG/ML  SOLN COMPARISON:  Abdominopelvic CT 03/27/2016 FINDINGS: Lower chest: The heart is enlarged. Coronary artery calcifications. Small pericardial effusion. Small bilateral pleural effusions, right greater than left. Hepatobiliary: Nodular hepatic contours suggesting cirrhosis. No focal liver abnormality. There is mild periportal edema. Diffuse gallbladder wall thickening. No calcified gallstone or abnormal gallbladder distension. No biliary dilatation. Pancreas: No ductal dilatation or inflammation. Spleen: No splenomegaly. Splenic cleft posteriorly. No focal abnormality. Adrenals/Urinary Tract: No adrenal nodule. No hydronephrosis or suspicious renal lesion. No renal stone. Partially distended urinary bladder. Stomach/Bowel: Nondistended stomach. No small bowel obstruction or inflammation. Right and  transverse colectomy with anastomosis in the central abdomen. The descending colon is present in the right abdomen. Multiple enteric sutures within the sigmoid colon. Left perianal and perineal scarring at site of previous fluid collection. Vascular/Lymphatic: Aortic atherosclerosis. No aortic aneurysm. 10 mm left external iliac node, chronic. No suspicious lymphadenopathy. Reproductive: Prostate is unremarkable. Other: Small to moderate abdominopelvic ascites. Generalized edema of the subcutaneous and intra-abdominal fat. Postsurgical change of the anterior abdominal wall. Small fat containing left inguinal hernia. Musculoskeletal: There are no acute or suspicious osseous abnormalities. IMPRESSION: 1. Nodular hepatic contours suggesting cirrhosis. Small to moderate abdominopelvic ascites. Generalized edema of the subcutaneous and intra-abdominal fat. 2. Diffuse gallbladder wall thickening, nonspecific in the setting of liver disease. No calcified gallstone or abnormal gallbladder distension. 3. Small bilateral pleural effusions, right greater than left. Small pericardial effusion. 4. Postsurgical change of the colon. No bowel obstruction or inflammation. Aortic Atherosclerosis (ICD10-I70.0). Electronically Signed   By: Andrea Gasman M.D.   On: 07/21/2024 16:01   DG Chest Port 1 View Result Date: 07/21/2024 EXAM: 1 VIEW(S) XRAY OF THE CHEST 07/21/2024 02:25:52 PM COMPARISON: None available. CLINICAL HISTORY: SOB (shortness of breath) [id number] FINDINGS: LUNGS AND PLEURA: No focal pulmonary opacity. No pulmonary edema. No pleural effusion. No pneumothorax. HEART AND MEDIASTINUM: Central venous congestion. Enlarged cardiac shadow. BONES AND SOFT TISSUES: No acute osseous abnormality. IMPRESSION: 1. Cardiomegaly and central venous congestion. 2. Cannot exclude pericardial effusion Electronically signed by: Norleen Boxer MD 07/21/2024 03:00 PM EDT RP Workstation: [id number]     Assessment and Plan: Benjamin Abbott is a 63 y.o. male with a hx of cocaine abuse, marijuana abuse, alcohol abuse, hypertension, COPD, OSA, and colon cancer s/p colostomy in 1999 who is being seen 07/25/2024 for the evaluation of heart failure at the request of Ava Swayze.   Acute systolic heart  failure  LVH Presented to the emergency department for worsening shortness of breath, dyspnea on exertion, and lower extremity edema that have been worsening over the past month.  Denies any prior history of heart failure. Labs showed elevated proBNP of 1405 Echocardiogram on 07/23/2024 showed a reduced LVEF of 25 to 30%, in the inferior and lateral segment akinesis (felt to be in LAD distribution), mild LVH, normal RV systolic function, mildly dilated left atrium, moderately dilated right atrium, moderate to severe MR, moderate to severe TR, normal pulmonary artery systolic pressure, and IVC with greater than 50% respiratory variability. Was started on IV Lasix patient appeared euvolemic on exam Hold IV Lasix. negative HIV, potassium of 3.6 Start oral Lasix 40 mg daily Recommend consulting GI to evaluate bleeding risk prior to cardiac catheterization. Plan for a cardiac catheterization Informed Consent   Shared Decision Making/Informed Consent The risks [stroke (1 in 1000), death (1 in 1000), kidney failure [usually temporary] (1 in 500), bleeding (1 in 200), allergic reaction [possibly serious] (1 in 200)], benefits (diagnostic support and management of coronary artery disease) and alternatives of a cardiac catheterization were discussed in detail with Benjamin Abbott and he is willing to proceed.    GDMT Continue Jardiance 10 mg daily. Continue losartan  25 mg daily. stop metoprolol tartrate 25 mg twice daily. Start metoprolol succinate 25 mg twice daily.   New onset atrial fibrillation CHA2DS2-VASc Score = 3 [CHF History: 1, HTN History: 1, Diabetes History: 0, Stroke History: 0, Vascular Disease History: 1, Age Score: 0, Gender  Score: 0].  Therefore, the patient's annual risk of stroke is 3.2 %.    Suspect that alcohol abuse and OSA are contributing to atrial fibrillation. Heart rates remain slightly elevated in the 90s to 120s on telemetry Stop metoprolol tartrate 25 mg twice daily Start metoprolol succinate 25 mg twice daily Start digoxin 0.125 mg daily. Stop Eliquis Start IV heparin per pharmacy May consider TEE cardioversion   Cirrhosis Suspected secondary to longstanding history of alcohol abuse. CT abdomen showed liver contour suggestive of cirrhosis, small to moderate abdominal ascites. Had ascites on abdominal ultrasound. Consulted GI to evaluate bleeding risk prior to cardiac catheterization.   Hypertension Management per GDMT above.   Alcohol abuse Cannabis abuse States that he drinks about 2X40s of liquor a week. Recommend cessation   CAD Elevated high-sensitivity troponins elevated high-sensitivity troponins 35 > 38. Planning ischemic evaluation as above CT abdomen pelvis did find CAD. Start aspirin 81 mg daily Will hold off starting statin due to concerns for liver cirrhosis.  Order lipid panel and LPA   Colon cancer  s/p colostomy in 1999 Denies any recent concerns with colon cancer  Otherwise management per primary   Risk Assessment/Risk Scores:     [place]  Heart Association (NYHA) Functional Class NYHA Class III  CHA2DS2-VASc Score = 3   This indicates a 3.2% annual risk of stroke. The patient's score is based upon: CHF History: 1 HTN History: 1 Diabetes History: 0 Stroke History: 0 Vascular Disease History: 1 Age Score: 0 Gender Score: 0        For questions or updates, please contact [facility] Please consult [email or web address] for contact info under     Signed, Morse Clause, PA-C  07/25/2024 1:37 PM

## 2024-07-25 NOTE — Plan of Care (Signed)
  Problem: Education: Goal: Knowledge of General Education information will improve Description: Including pain rating scale, medication(s)/side effects and non-pharmacologic comfort measures Outcome: Progressing   Problem: Activity: Goal: Risk for activity intolerance will decrease Outcome: Progressing   Problem: Nutrition: Goal: Adequate nutrition will be maintained Outcome: Progressing   Problem: Activity: Goal: Capacity to carry out activities will improve Outcome: Progressing

## 2024-07-25 NOTE — Progress Notes (Signed)
 Mobility Specialist Progress Note:    07/25/24 1115  Mobility  Activity Ambulated with assistance  Level of Assistance Standby assist, set-up cues, supervision of patient - no hands on  Assistive Device None  Distance Ambulated (ft) 500 ft  Activity Response Tolerated well  Mobility Referral Yes  Mobility visit 1 Mobility  Mobility Specialist Start Time (ACUTE ONLY) 1115  Mobility Specialist Stop Time (ACUTE ONLY) 1123  Mobility Specialist Time Calculation (min) (ACUTE ONLY) 8 min   Received pt ambulating in room agreeable to session in hallway. No c/o any symptoms. Pt moving and ambulating well with small moments of instability such as straying off to the right or such. Returned pt to room w/ all needs met.   Venetia Keel Mobility Specialist Please Neurosurgeon or Rehab Office at [phone]

## 2024-07-25 NOTE — Progress Notes (Signed)
 Mobility Specialist Progress Note:    07/25/24 1458  Mobility  Activity Ambulated independently  Level of Assistance Independent after set-up  Assistive Device None  Distance Ambulated (ft) 200 ft  Activity Response Tolerated well  Mobility Referral Yes  Mobility visit 1 Mobility  Mobility Specialist Start Time (ACUTE ONLY) 1458  Mobility Specialist Stop Time (ACUTE ONLY) 1513  Mobility Specialist Time Calculation (min) (ACUTE ONLY) 15 min   Received pt ambulating in room needing assistance w/ gown agreeable to ambulating. No c/o any symptoms. Pt moving and ambulating well. Returned pt to room w/ all needs met.   Venetia Keel Mobility Specialist Please Neurosurgeon or Rehab Office at [phone]

## 2024-07-26 ENCOUNTER — Inpatient Hospital Stay (HOSPITAL_COMMUNITY): Admitting: Registered Nurse

## 2024-07-26 ENCOUNTER — Encounter (HOSPITAL_COMMUNITY): Payer: Self-pay | Admitting: Internal Medicine

## 2024-07-26 ENCOUNTER — Other Ambulatory Visit: Payer: Self-pay

## 2024-07-26 ENCOUNTER — Encounter (HOSPITAL_COMMUNITY): Admission: EM | Disposition: A | Payer: Self-pay | Source: Home / Self Care | Attending: Internal Medicine

## 2024-07-26 ENCOUNTER — Encounter (HOSPITAL_COMMUNITY)
Admission: EM | Disposition: A | Discharge status: Home / Self Care | Payer: Self-pay | Source: Home / Self Care | Attending: Internal Medicine

## 2024-07-26 ENCOUNTER — Encounter (HOSPITAL_COMMUNITY): Admitting: Registered Nurse

## 2024-07-26 DIAGNOSIS — I4819 Other persistent atrial fibrillation: Secondary | ICD-10-CM | POA: Diagnosis not present

## 2024-07-26 DIAGNOSIS — K7031 Alcoholic cirrhosis of liver with ascites: Secondary | ICD-10-CM | POA: Diagnosis not present

## 2024-07-26 DIAGNOSIS — I5021 Acute systolic (congestive) heart failure: Secondary | ICD-10-CM

## 2024-07-26 DIAGNOSIS — I4891 Unspecified atrial fibrillation: Secondary | ICD-10-CM

## 2024-07-26 DIAGNOSIS — I2510 Atherosclerotic heart disease of native coronary artery without angina pectoris: Secondary | ICD-10-CM | POA: Diagnosis not present

## 2024-07-26 DIAGNOSIS — I110 Hypertensive heart disease with heart failure: Secondary | ICD-10-CM

## 2024-07-26 DIAGNOSIS — Z87891 Personal history of nicotine dependence: Secondary | ICD-10-CM

## 2024-07-26 DIAGNOSIS — K7460 Unspecified cirrhosis of liver: Secondary | ICD-10-CM

## 2024-07-26 DIAGNOSIS — I5082 Biventricular heart failure: Secondary | ICD-10-CM | POA: Diagnosis not present

## 2024-07-26 DIAGNOSIS — I428 Other cardiomyopathies: Secondary | ICD-10-CM

## 2024-07-26 HISTORY — PX: RIGHT/LEFT HEART CATH AND CORONARY ANGIOGRAPHY: CATH118266

## 2024-07-26 HISTORY — PX: ESOPHAGOGASTRODUODENOSCOPY: SHX5428

## 2024-07-26 LAB — LIPID PANEL
Cholesterol: 125 mg/dL (ref 0–200)
HDL: 46 mg/dL (ref >40)
LDL Cholesterol: 64 mg/dL (ref 0–99)
Total CHOL/HDL Ratio: 2.7 ratio
Triglycerides: 77 mg/dL (ref <150)
VLDL: 15 mg/dL (ref 0–40)

## 2024-07-26 LAB — POCT I-STAT EG7
Acid-Base Excess: 0 mmol/L (ref 0.0–2.0)
Acid-Base Excess: 0 mmol/L (ref 0.0–2.0)
Acid-base deficit: 4 mmol/L — ABNORMAL HIGH (ref 0.0–2.0)
Bicarbonate: 26 mmol/L (ref 20.0–28.0)
Bicarbonate: 26.2 mmol/L (ref 20.0–28.0)
Bicarbonate: 22.6 mmol/L (ref 20.0–28.0)
Calcium, Ion: 1.1 mmol/L — ABNORMAL LOW (ref 1.15–1.40)
Calcium, Ion: 1.14 mmol/L — ABNORMAL LOW (ref 1.15–1.40)
Calcium, Ion: 0.98 mmol/L — ABNORMAL LOW (ref 1.15–1.40)
HCT: 41 % (ref 39.0–52.0)
HCT: 42 % (ref 39.0–52.0)
HCT: 40 % (ref 39.0–52.0)
Hemoglobin: 13.9 g/dL (ref 13.0–17.0)
Hemoglobin: 14.3 g/dL (ref 13.0–17.0)
Hemoglobin: 13.6 g/dL (ref 13.0–17.0)
O2 Saturation: 46 %
O2 Saturation: 47 %
O2 Saturation: 46 %
Potassium: 4.1 mmol/L (ref 3.5–5.1)
Potassium: 4.2 mmol/L (ref 3.5–5.1)
Potassium: 3.5 mmol/L (ref 3.5–5.1)
Sodium: 136 mmol/L (ref 135–145)
Sodium: 140 mmol/L (ref 135–145)
Sodium: 128 mmol/L — ABNORMAL LOW (ref 135–145)
TCO2: 27 mmol/L (ref 22–32)
TCO2: 28 mmol/L (ref 22–32)
TCO2: 24 mmol/L (ref 22–32)
pCO2, Ven: 43.8 mmHg — ABNORMAL LOW (ref 44–60)
pCO2, Ven: 46.2 mmHg (ref 44–60)
pCO2, Ven: 47.2 mmHg (ref 44–60)
pH, Ven: 7.362 (ref 7.25–7.43)
pH, Ven: 7.381 (ref 7.25–7.43)
pH, Ven: 7.289 (ref 7.25–7.43)
pO2, Ven: 26 mmHg — CL (ref 32–45)
pO2, Ven: 27 mmHg — CL (ref 32–45)
pO2, Ven: 28 mmHg — CL (ref 32–45)

## 2024-07-26 LAB — BASIC METABOLIC PANEL WITH GFR
Anion gap: 11 (ref 5–15)
BUN: 13 mg/dL (ref 8–23)
CO2: 22 mmol/L (ref 22–32)
Calcium: 8.1 mg/dL — ABNORMAL LOW (ref 8.9–10.3)
Chloride: 104 mmol/L (ref 98–111)
Creatinine, Ser: 1.31 mg/dL — ABNORMAL HIGH (ref 0.61–1.24)
GFR, Estimated: >60 mL/min (ref >60)
Glucose, Bld: 113 mg/dL — ABNORMAL HIGH (ref 70–99)
Potassium: 4 mmol/L (ref 3.5–5.1)
Sodium: 137 mmol/L (ref 135–145)

## 2024-07-26 LAB — POCT I-STAT 7, (LYTES, BLD GAS, ICA,H+H)
Acid-Base Excess: 1 mmol/L (ref 0.0–2.0)
Bicarbonate: 24.9 mmol/L (ref 20.0–28.0)
Calcium, Ion: 1.12 mmol/L — ABNORMAL LOW (ref 1.15–1.40)
HCT: 42 % (ref 39.0–52.0)
Hemoglobin: 14.3 g/dL (ref 13.0–17.0)
O2 Saturation: 93 %
Potassium: 4.3 mmol/L (ref 3.5–5.1)
Sodium: 142 mmol/L (ref 135–145)
TCO2: 26 mmol/L (ref 22–32)
pCO2 arterial: 37.3 mmHg (ref 32–48)
pH, Arterial: 7.432 (ref 7.35–7.45)
pO2, Arterial: 66 mmHg — ABNORMAL LOW (ref 83–108)

## 2024-07-26 LAB — CBC
HCT: 40.1 % (ref 39.0–52.0)
Hemoglobin: 13.4 g/dL (ref 13.0–17.0)
MCH: 28.3 pg (ref 26.0–34.0)
MCHC: 33.4 g/dL (ref 30.0–36.0)
MCV: 84.8 fL (ref 80.0–100.0)
Platelets: 190 K/uL (ref 150–400)
RBC: 4.73 MIL/uL (ref 4.22–5.81)
RDW: 15.9 % — ABNORMAL HIGH (ref 11.5–15.5)
WBC: 8.2 K/uL (ref 4.0–10.5)
nRBC: 0 % (ref 0.0–0.2)

## 2024-07-26 LAB — APTT: aPTT: >200 s (ref 24–36)

## 2024-07-26 LAB — PROTIME-INR
INR: 1.1 (ref 0.8–1.2)
Prothrombin Time: 15.1 s (ref 11.4–15.2)

## 2024-07-26 LAB — LACTIC ACID, PLASMA: Lactic Acid, Venous: 1.9 mmol/L (ref 0.5–1.9)

## 2024-07-26 LAB — TSH: TSH: 2.548 u[IU]/mL (ref 0.350–4.500)

## 2024-07-26 LAB — HEPARIN LEVEL (UNFRACTIONATED): Heparin Unfractionated: >1.1 [IU]/mL — ABNORMAL HIGH (ref 0.30–0.70)

## 2024-07-26 SURGERY — EGD (ESOPHAGOGASTRODUODENOSCOPY)
Anesthesia: Monitor Anesthesia Care

## 2024-07-26 SURGERY — RIGHT/LEFT HEART CATH AND CORONARY ANGIOGRAPHY
Anesthesia: LOCAL

## 2024-07-26 MED ORDER — MIDAZOLAM HCL (PF) 2 MG/2ML IJ SOLN
INTRAMUSCULAR | Status: DC | PRN
  Administered 2024-07-26: 1 mg via INTRAVENOUS

## 2024-07-26 MED ORDER — APIXABAN 5 MG PO TABS
5.0000 mg | ORAL_TABLET | Freq: Two times a day (BID) | ORAL | Status: DC
  Administered 2024-07-27 – 2024-08-01 (×11): 5 mg via ORAL
  Filled 2024-07-26 (×11): qty 1

## 2024-07-26 MED ORDER — VERAPAMIL HCL 2.5 MG/ML IV SOLN
INTRAVENOUS | Status: AC
  Filled 2024-07-26: qty 2

## 2024-07-26 MED ORDER — LIDOCAINE 2% (20 MG/ML) 5 ML SYRINGE
INTRAMUSCULAR | Status: DC | PRN
  Administered 2024-07-26: 100 mg via INTRAVENOUS

## 2024-07-26 MED ORDER — HEPARIN SODIUM (PORCINE) 1000 UNIT/ML IJ SOLN
INTRAMUSCULAR | Status: AC
  Filled 2024-07-26: qty 10

## 2024-07-26 MED ORDER — HYDRALAZINE HCL 20 MG/ML IJ SOLN: 10.0000 mg | INTRAMUSCULAR | Status: AC | PRN

## 2024-07-26 MED ORDER — LABETALOL HCL 5 MG/ML IV SOLN
INTRAVENOUS | Status: DC | PRN
  Administered 2024-07-26 (×2): 2.5 mg via INTRAVENOUS

## 2024-07-26 MED ORDER — FREE WATER
500.0000 mL | Freq: Once | Status: AC
  Administered 2024-07-26: 500 mL via ORAL

## 2024-07-26 MED ORDER — PROPOFOL 10 MG/ML IV BOLUS
INTRAVENOUS | Status: DC | PRN
  Administered 2024-07-26: 20 mg via INTRAVENOUS
  Administered 2024-07-26: 30 mg via INTRAVENOUS
  Administered 2024-07-26: 125 ug/kg/min via INTRAVENOUS

## 2024-07-26 MED ORDER — HEPARIN (PORCINE) 25000 UT/250ML-% IV SOLN
1000.0000 [IU]/h | INTRAVENOUS | Status: DC
  Administered 2024-07-26: 1000 [IU]/h via INTRAVENOUS

## 2024-07-26 MED ORDER — LIDOCAINE HCL (PF) 1 % IJ SOLN
INTRAMUSCULAR | Status: AC
  Filled 2024-07-26: qty 30

## 2024-07-26 MED ORDER — LABETALOL HCL 5 MG/ML IV SOLN: 10.0000 mg | INTRAVENOUS | Status: AC | PRN

## 2024-07-26 MED ORDER — GLYCOPYRROLATE PF 0.2 MG/ML IJ SOSY
PREFILLED_SYRINGE | INTRAMUSCULAR | Status: DC | PRN
  Administered 2024-07-26: .1 mg via INTRAVENOUS

## 2024-07-26 MED ORDER — FENTANYL CITRATE (PF) 100 MCG/2ML IJ SOLN
INTRAMUSCULAR | Status: DC | PRN
  Administered 2024-07-26: 25 ug via INTRAVENOUS

## 2024-07-26 MED ORDER — SODIUM CHLORIDE 0.9% FLUSH: 3.0000 mL | INTRAVENOUS | Status: DC | PRN

## 2024-07-26 MED ORDER — FENTANYL CITRATE (PF) 100 MCG/2ML IJ SOLN
INTRAMUSCULAR | Status: AC
  Filled 2024-07-26: qty 2

## 2024-07-26 MED ORDER — HEPARIN SODIUM (PORCINE) 1000 UNIT/ML IJ SOLN
INTRAMUSCULAR | Status: DC | PRN
  Administered 2024-07-26: 4500 [IU] via INTRAVENOUS

## 2024-07-26 MED ORDER — VERAPAMIL HCL 2.5 MG/ML IV SOLN
INTRAVENOUS | Status: DC | PRN
  Administered 2024-07-26: 10 mL via INTRA_ARTERIAL

## 2024-07-26 MED ORDER — SODIUM CHLORIDE 0.9% FLUSH
3.0000 mL | Freq: Two times a day (BID) | INTRAVENOUS | Status: DC
  Administered 2024-07-26 – 2024-08-01 (×8): 3 mL via INTRAVENOUS

## 2024-07-26 MED ORDER — SODIUM CHLORIDE 0.9% FLUSH
3.0000 mL | Freq: Two times a day (BID) | INTRAVENOUS | Status: DC
  Administered 2024-07-26: 3 mL via INTRAVENOUS

## 2024-07-26 MED ORDER — PHENYLEPHRINE 80 MCG/ML (10ML) SYRINGE FOR IV PUSH (FOR BLOOD PRESSURE SUPPORT)
PREFILLED_SYRINGE | INTRAVENOUS | Status: DC | PRN
Start: 2024-07-26 — End: 2024-07-26
  Administered 2024-07-26 (×2): 160 ug via INTRAVENOUS

## 2024-07-26 MED ORDER — HEPARIN (PORCINE) IN NACL 1000-0.9 UT/500ML-% IV SOLN
INTRAVENOUS | Status: DC | PRN
  Administered 2024-07-26: 1000 mL

## 2024-07-26 MED ORDER — IOHEXOL 350 MG/ML SOLN
INTRAVENOUS | Status: DC | PRN
  Administered 2024-07-26: 40 mL

## 2024-07-26 MED ORDER — ASPIRIN 81 MG PO CHEW
81.0000 mg | CHEWABLE_TABLET | ORAL | Status: AC
  Administered 2024-07-26: 81 mg via ORAL
  Filled 2024-07-26: qty 1

## 2024-07-26 MED ORDER — LIDOCAINE HCL (PF) 1 % IJ SOLN
INTRAMUSCULAR | Status: DC | PRN
Start: 2024-07-26 — End: 2024-07-26
  Administered 2024-07-26: 2 mL
  Administered 2024-07-26: 5 mL

## 2024-07-26 MED ORDER — MIDAZOLAM HCL 2 MG/2ML IJ SOLN
INTRAMUSCULAR | Status: AC
  Filled 2024-07-26: qty 2

## 2024-07-26 MED ORDER — SODIUM CHLORIDE 0.9 % IV SOLN: 250.0000 mL | INTRAVENOUS | Status: AC | PRN

## 2024-07-26 MED ORDER — SACUBITRIL-VALSARTAN 24-26 MG PO TABS: 1.0000 | ORAL_TABLET | Freq: Two times a day (BID) | ORAL | Status: DC

## 2024-07-26 MED ORDER — SODIUM CHLORIDE 0.9 % IV SOLN: 250.0000 mL | INTRAVENOUS | Status: DC | PRN

## 2024-07-26 SURGICAL SUPPLY — 11 items
CATH BALLN WEDGE 5F 110CM (CATHETERS) IMPLANT
CATH INFINITI AMBI 5FR TG (CATHETERS) IMPLANT
DEVICE RAD COMP TR BAND LRG (VASCULAR PRODUCTS) IMPLANT
GLIDESHEATH SLEND SS 6F .021 (SHEATH) IMPLANT
GUIDEWIRE .025 260CM (WIRE) IMPLANT
GUIDEWIRE INQWIRE 1.5J.035X260 (WIRE) IMPLANT
PACK CARDIAC CATHETERIZATION (CUSTOM PROCEDURE TRAY) ×1 IMPLANT
SET ATX-X65L (MISCELLANEOUS) IMPLANT
SHEATH GLIDE SLENDER 4/5FR (SHEATH) IMPLANT
SHEATH PROBE COVER 6X72 (BAG) IMPLANT
WIRE EMERALD 3MM-J .025X260CM (WIRE) IMPLANT

## 2024-07-26 NOTE — Op Note (Signed)
 [facility] Patient Name: Benjamin Abbott Procedure Date : 07/26/2024 MRN: [id number] Attending MD: Oliva Boots , MD, [id number] Date of Birth: 1960-11-28 CSN: [id number] Age: 63 Admit Type: Inpatient Procedure:                Upper GI endoscopy Indications:              Cirrhosis rule out esophageal varices Providers:                Oliva Boots, MD, Jacquelyn Jaci Pierce, RN, Lorrayne Kitty, Technician Referring MD:              Medicines:                Monitored Anesthesia Care Complications:            No immediate complications. Estimated Blood Loss:     Estimated blood loss: none. Procedure:                Pre-Anesthesia Assessment:                           - Prior to the procedure, a History and Physical                            was performed, and patient medications and                            allergies were reviewed. The patient's tolerance of                            previous anesthesia was also reviewed. The risks                            and benefits of the procedure and the sedation                            options and risks were discussed with the patient.                            All questions were answered, and informed consent                            was obtained. Prior Anticoagulants: The patient has                            taken heparin, last dose was day of procedure. ASA                            Grade Assessment: III - A patient with severe                            systemic disease. After reviewing the risks and  benefits, the patient was deemed in satisfactory                            condition to undergo the procedure.                           After obtaining informed consent, the endoscope was                            passed under direct vision. Throughout the                            procedure, the patient's blood pressure, pulse, and                             oxygen saturations were monitored continuously. The                            [id number]-[id number] ([id number]) Olympus endoscope was introduced                            through the mouth, and advanced to the fourth part                            of duodenum. The upper GI endoscopy was                            accomplished without difficulty. The patient                            tolerated the procedure well. Scope In: Scope Out: Findings:      The examined esophagus was normal.      A small amount of food (residue) was found in the gastric body.      The entire examined stomach was normal.      The duodenal bulb, first portion of the duodenum, second portion of the       duodenum, third portion of the duodenum and fourth portion of the       duodenum were normal.      The cardia and gastric fundus were normal on retroflexion. Impression:               - Normal esophagus.                           - A small amount of food (residue) in the stomach.                           - Normal stomach.                           - Normal duodenal bulb, first portion of the                            duodenum, second portion of the duodenum, third  portion of the duodenum and fourth portion of the                            duodenum.                           - No specimens collected. no at risk lesions seen Recommendation:           -                           - Continue present medications.                           - Return to GI clinic PRN. call if we can be of any                            further assistance                           - Telephone GI clinic if symptomatic PRN.                           - Per cards. diet Procedure Code(s):        --- Professional ---                           [phone], Esophagogastroduodenoscopy, flexible,                            transoral; diagnostic, including collection of                            specimen(s) by brushing or washing, when  performed                            (separate procedure) Diagnosis Code(s):        --- Professional ---                           K74.60, Unspecified cirrhosis of liver CPT copyright 2022 American Medical Association. All rights reserved. The codes documented in this report are preliminary and upon coder review may  be revised to meet current compliance requirements. Oliva Boots, MD 07/26/2024 10:35:27 AM This report has been signed electronically. Number of Addenda: 0

## 2024-07-26 NOTE — Interval H&P Note (Signed)
 History and Physical Interval Note:  07/26/2024 3:35 PM  Benjamin Abbott  has presented today for surgery, with the diagnosis of HF.  The various methods of treatment have been discussed with the patient and family. After consideration of risks, benefits and other options for treatment, the patient has consented to  Procedure(s): RIGHT/LEFT HEART CATH AND CORONARY ANGIOGRAPHY (N/A) as a surgical intervention.  The patient's history has been reviewed, patient examined, no change in status, stable for surgery.  I have reviewed the patient's chart and labs.  Questions were answered to the patient's satisfaction.     Shelbylynn Walczyk J Indica Marcott

## 2024-07-26 NOTE — H&P (View-Only) (Signed)
 Cardiology Progress Note  Patient ID: Benjamin Abbott MRN: [id number] DOB: 1961/09/08 Date of Encounter: 07/26/2024 Primary Cardiologist: Benjamin ONEIDA Decent, MD  Subjective   Chief Complaint: None.   HPI: A-fib rates much improved.  Going for screening EGD today.  Right and left heart cath after.  ROS:  All other ROS reviewed and negative. Pertinent positives noted in the HPI.     Telemetry  Overnight telemetry shows Afib 80-100 bpm, which I personally reviewed.    Physical Exam   Vitals:   07/26/24 0740 07/26/24 0753 07/26/24 0918 07/26/24 0933  BP: (!) 129/103  (!) 129/103 (!) 135/97  Pulse: 81  81 81  Resp: 17 (!) 33  (!) 21  Temp: 97.8 F (36.6 C)   (!) 97.2 F (36.2 C)  TempSrc: Oral   Temporal  SpO2: 100%     Weight:    85.7 kg  Height:    5' 9 (1.753 m)    Intake/Output Summary (Last 24 hours) at 07/26/2024 0939 Last data filed at 07/26/2024 0919 Gross per 24 hour  Intake 915.43 ml  Output 3225 ml  Net -2309.57 ml       07/26/2024    9:33 AM 07/26/2024    5:31 AM 07/25/2024    4:33 AM  Last 3 Weights  Weight (lbs) 189 lb 186 lb 1.6 oz 189 lb 1.6 oz  Weight (kg) 85.73 kg 84.414 kg 85.775 kg    Body mass index is 27.91 kg/m.  General: Well nourished, well developed, in no acute distress Head: Atraumatic, normal size  Eyes: PEERLA, EOMI  Neck: Supple, no JVD Endocrine: No thryomegaly Cardiac: Normal S1, S2; irregular rhythm Lungs: Clear to auscultation bilaterally, no wheezing, rhonchi or rales  Abd: Distended abdomen Ext: No edema, pulses 2+ Musculoskeletal: No deformities, BUE and BLE strength normal and equal Skin: Warm and dry, no rashes   Neuro: Alert and oriented to person, place, time, and situation, CNII-XII grossly intact, no focal deficits  Psych: Normal mood and affect   Cardiac Studies  TTE 07/23/2024  1. Left ventricular ejection fraction, by estimation, is 25 to 30%. The  left ventricle has severely decreased function. The left  ventricle  demonstrates regional wall motion abnormalities (see scoring  diagram/findings for description). The left  ventricular internal cavity size was moderately dilated. There is mild  left ventricular hypertrophy. Left ventricular diastolic parameters are  indeterminate. LAD akinesis with no LV thrombus.   2. Right ventricular systolic function is normal. The right ventricular  size is mildly enlarged. There is normal pulmonary artery systolic  pressure.   3. Left atrial size was mildly dilated.   4. Right atrial size was moderately dilated.   5. The mitral valve is normal in structure. Moderate to severe mitral  valve regurgitation. No evidence of mitral stenosis.   6. Tricuspid valve regurgitation is moderate to severe.   7. The aortic valve is tricuspid. Aortic valve regurgitation is trivial.  No aortic stenosis is present.   8. Severely dilated pulmonary artery.   9. The inferior vena cava is normal in size with greater than 50%  respiratory variability, suggesting right atrial pressure of 3 mmHg.   Patient Profile  Benjamin Abbott is a 63 y.o. male with prior alcohol abuse, cirrhosis, hypertension, colon cancer status post colectomy admitted on 07/21/2024 for acute systolic heart failure.  Also found to have new onset A-fib with RVR.  Assessment & Plan   # Acute systolic heart failure, EF 25-30% - Euvolemic  on exam.  Net -15.8 L.  No further IV diuresis needed. - Unclear etiology.  Could be ischemic.  Endorses chest symptoms.  Echo with concerns for possible wall motion abnormality LAD.  He is going to have a screening EGD done given his diagnosis of cirrhosis.  If there is no varices or considerable risk of bleeding we will proceed with right and left heart cath today. 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  However he tells me he stopped drinking. - Could also be related to A-fib with RVR.  He is going to need a TEE/cardioversion.  However we are heading into  the weekend.  If his heart cath shows he is euvolemic and no significant CAD could likely come back as an outpatient for TEE/cardioversion. - Continue metoprolol succinate 25 mg daily.  Stop losartan .  Transition to Entresto 24-26 mg twice daily tonight.  Continue Aldactone 25 mg daily.  He is on Lasix 40 mg p.o. daily.  We will start him on Jardiance after his procedures today. - Digoxin was added for better rate control in setting of low EF and A-fib.  Does not appear to be in shock.  Informed Consent   Shared Decision Making/Informed Consent The risks [stroke (1 in 1000), death (1 in 1000), kidney failure [usually temporary] (1 in 500), bleeding (1 in 200), allergic reaction [possibly serious] (1 in 200)], benefits (diagnostic support and management of coronary artery disease) and alternatives of a cardiac catheterization were discussed in detail with Benjamin Abbott and he is willing to proceed.      # Persistent A-fib - Could be culprit for cardiomyopathy.  Or contributing.  Continue metoprolol succinate 25 mg daily.  Continue digoxin 0.125 mg daily.  Kidney function is stable. - On heparin drip.  Transition to Eliquis after cath.  # Cirrhosis - No further alcohol per his reports.  Screening EGD today.  # Elevated troponin, demand # Chest pain - Chest pain does not sound cardiac.  Troponins are minimally elevated and flat related to heart failure.  We are going to do an ischemic evaluation as above.     For questions or updates, please contact [facility] Please consult [email or web address] for contact info under         Signed, Benjamin T. Barbaraann, MD, [facility]  [facility]  07/26/2024 9:39 AM

## 2024-07-26 NOTE — Anesthesia Postprocedure Evaluation (Signed)
 Anesthesia Post Note  Patient: Benjamin Abbott  Procedure(s) Performed: EGD (ESOPHAGOGASTRODUODENOSCOPY)     Patient location during evaluation: Endoscopy Anesthesia Type: MAC Level of consciousness: awake and alert Pain management: pain level controlled Vital Signs Assessment: post-procedure vital signs reviewed and stable Respiratory status: spontaneous breathing, nonlabored ventilation, respiratory function stable and patient connected to nasal cannula oxygen Cardiovascular status: blood pressure returned to baseline and stable Postop Assessment: no apparent nausea or vomiting Anesthetic complications: no   No notable events documented.  Last Vitals:  Vitals:   07/26/24 1105 07/26/24 1110  BP:  105/78  Pulse: (!) 102 (!) 120  Resp: (!) 24 (!) 31  Temp:    SpO2: 99% 97%    Last Pain:  Vitals:   07/26/24 1105  TempSrc:   PainSc: 0-No pain                 Garnette DELENA Gab

## 2024-07-26 NOTE — Progress Notes (Signed)
 Reviewed chart regarding update on PICC, unable to be placed until tomorrow, will need to wait until AM to start milrinone. Dr. Barbaraann had signed out earlier that patient was clinically stable. In interim lactic acid has come back normal. Patient remains asymptomatic and hemodynamically stable presently. Nurse aware to notify for any changes in status or new symptoms.

## 2024-07-26 NOTE — Progress Notes (Signed)
 Benjamin Abbott 9:46 AM  Subjective: Patient with no GI complaints and we rediscussed the procedure and answered all of his questions  Objective: Vital signs stable afebrile no acute distress exam please see preassessment evaluation chemistries and CBC okay INR normal  Assessment: Cirrhosis  Plan: Will turn off heparin drip for the procedure and barring a problem we will restart afterwards and okay to proceed with anesthesia assistance  [facility]  office [phone] After 5PM or if no answer call [phone]

## 2024-07-26 NOTE — Progress Notes (Signed)
 Cardiac cath shows obstructive disease in the RCA and nonobstructive disease in the LAD.  Cardiomyopathy is out of proportion to CAD.  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  Pulmonary capillary wedge pressure 12 mmHg.  Mean PA pressure 28.  He does have evidence of right heart failure with a papi of 1.2.  Discussed management with him.  Will plan for a PICC line to be placed.  He will then be started on milrinone.  Will then have advanced heart failure to see him tomorrow.  We will stop his beta-blocker.  Continue digoxin for rate control.  If A-fib becomes an issue would also add amiodarone.  Plan to add Eliquis starting tomorrow.  He is going to need further optimization of GDMT and a TEE/cardioversion on Monday or Tuesday.  We will see how he does.  Discussed the plan with the patient who is in agreement.  He is relatively stable in his room.  We will check a lactic acid but he is breathing comfortably.  I do not believe he needs to be transferred to the ICU.  Signed, Darryle DASEN. Barbaraann, MD, [facility]  [facility]  [address] [place], [place] [address] [phone]  5:40 PM

## 2024-07-26 NOTE — Plan of Care (Signed)
   Problem: Education: Goal: Knowledge of General Education information will improve Description: Including pain rating scale, medication(s)/side effects and non-pharmacologic comfort measures Outcome: Progressing   Problem: Activity: Goal: Risk for activity intolerance will decrease Outcome: Progressing

## 2024-07-26 NOTE — Progress Notes (Signed)
 Cardiology Progress Note  Patient ID: Benjamin Abbott MRN: [id number] DOB: 1961/09/08 Date of Encounter: 07/26/2024 Primary Cardiologist: Darryle ONEIDA Decent, MD  Subjective   Chief Complaint: None.   HPI: A-fib rates much improved.  Going for screening EGD today.  Right and left heart cath after.  ROS:  All other ROS reviewed and negative. Pertinent positives noted in the HPI.     Telemetry  Overnight telemetry shows Afib 80-100 bpm, which I personally reviewed.    Physical Exam   Vitals:   07/26/24 0740 07/26/24 0753 07/26/24 0918 07/26/24 0933  BP: (!) 129/103  (!) 129/103 (!) 135/97  Pulse: 81  81 81  Resp: 17 (!) 33  (!) 21  Temp: 97.8 F (36.6 C)   (!) 97.2 F (36.2 C)  TempSrc: Oral   Temporal  SpO2: 100%     Weight:    85.7 kg  Height:    5' 9 (1.753 m)    Intake/Output Summary (Last 24 hours) at 07/26/2024 0939 Last data filed at 07/26/2024 0919 Gross per 24 hour  Intake 915.43 ml  Output 3225 ml  Net -2309.57 ml       07/26/2024    9:33 AM 07/26/2024    5:31 AM 07/25/2024    4:33 AM  Last 3 Weights  Weight (lbs) 189 lb 186 lb 1.6 oz 189 lb 1.6 oz  Weight (kg) 85.73 kg 84.414 kg 85.775 kg    Body mass index is 27.91 kg/m.  General: Well nourished, well developed, in no acute distress Head: Atraumatic, normal size  Eyes: PEERLA, EOMI  Neck: Supple, no JVD Endocrine: No thryomegaly Cardiac: Normal S1, S2; irregular rhythm Lungs: Clear to auscultation bilaterally, no wheezing, rhonchi or rales  Abd: Distended abdomen Ext: No edema, pulses 2+ Musculoskeletal: No deformities, BUE and BLE strength normal and equal Skin: Warm and dry, no rashes   Neuro: Alert and oriented to person, place, time, and situation, CNII-XII grossly intact, no focal deficits  Psych: Normal mood and affect   Cardiac Studies  TTE 07/23/2024  1. Left ventricular ejection fraction, by estimation, is 25 to 30%. The  left ventricle has severely decreased function. The left  ventricle  demonstrates regional wall motion abnormalities (see scoring  diagram/findings for description). The left  ventricular internal cavity size was moderately dilated. There is mild  left ventricular hypertrophy. Left ventricular diastolic parameters are  indeterminate. LAD akinesis with no LV thrombus.   2. Right ventricular systolic function is normal. The right ventricular  size is mildly enlarged. There is normal pulmonary artery systolic  pressure.   3. Left atrial size was mildly dilated.   4. Right atrial size was moderately dilated.   5. The mitral valve is normal in structure. Moderate to severe mitral  valve regurgitation. No evidence of mitral stenosis.   6. Tricuspid valve regurgitation is moderate to severe.   7. The aortic valve is tricuspid. Aortic valve regurgitation is trivial.  No aortic stenosis is present.   8. Severely dilated pulmonary artery.   9. The inferior vena cava is normal in size with greater than 50%  respiratory variability, suggesting right atrial pressure of 3 mmHg.   Patient Profile  Benjamin Abbott is a 63 y.o. male with prior alcohol abuse, cirrhosis, hypertension, colon cancer status post colectomy admitted on 07/21/2024 for acute systolic heart failure.  Also found to have new onset A-fib with RVR.  Assessment & Plan   # Acute systolic heart failure, EF 25-30% - Euvolemic  on exam.  Net -15.8 L.  No further IV diuresis needed. - Unclear etiology.  Could be ischemic.  Endorses chest symptoms.  Echo with concerns for possible wall motion abnormality LAD.  He is going to have a screening EGD done given his diagnosis of cirrhosis.  If there is no varices or considerable risk of bleeding we will proceed with right and left heart cath today. 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  However he tells me he stopped drinking. - Could also be related to A-fib with RVR.  He is going to need a TEE/cardioversion.  However we are heading into  the weekend.  If his heart cath shows he is euvolemic and no significant CAD could likely come back as an outpatient for TEE/cardioversion. - Continue metoprolol succinate 25 mg daily.  Stop losartan .  Transition to Entresto 24-26 mg twice daily tonight.  Continue Aldactone 25 mg daily.  He is on Lasix 40 mg p.o. daily.  We will start him on Jardiance after his procedures today. - Digoxin was added for better rate control in setting of low EF and A-fib.  Does not appear to be in shock.  Informed Consent   Shared Decision Making/Informed Consent The risks [stroke (1 in 1000), death (1 in 1000), kidney failure [usually temporary] (1 in 500), bleeding (1 in 200), allergic reaction [possibly serious] (1 in 200)], benefits (diagnostic support and management of coronary artery disease) and alternatives of a cardiac catheterization were discussed in detail with [name] and he is willing to proceed.      # Persistent A-fib - Could be culprit for cardiomyopathy.  Or contributing.  Continue metoprolol succinate 25 mg daily.  Continue digoxin 0.125 mg daily.  Kidney function is stable. - On heparin drip.  Transition to Eliquis after cath.  # Cirrhosis - No further alcohol per his reports.  Screening EGD today.  # Elevated troponin, demand # Chest pain - Chest pain does not sound cardiac.  Troponins are minimally elevated and flat related to heart failure.  We are going to do an ischemic evaluation as above.     For questions or updates, please contact [facility] Please consult [email or web address] for contact info under         Signed, Darryle T. Barbaraann, MD, [facility]  [facility]  07/26/2024 9:39 AM

## 2024-07-26 NOTE — Progress Notes (Signed)
 Notified pharmacist of critical PTT. They will address.

## 2024-07-26 NOTE — Progress Notes (Signed)
 Progress Note   Patient: Benjamin Abbott FMW:[id number] DOB: 07-05-1961 DOA: 07/21/2024     4 DOS: the patient was seen and examined on 07/26/2024   Brief hospital course: The patient is a 63 yr old man who presented to the ED at [facility] for complaints of abdominal bloated and swelling in h is lower extremities as well as shortness of breath. In the ED the patient was found to have a BNP of 2000, a new finding of atrial fibrillation with rate control, and pulmonary vascular congestion. He also was found to have a protuberant abdomen with swelling in his legs bilaterally.   CXR demonstrated pulmonary vascular congestion, CT of the abdomen and pelvis demonstrated ascites and hepatic cirrhosis.  The patient was transferred to [facility] for admission. Echocatdiogram was obtained that demonstrated severe systolic CHF with EF of 25%. The heart failure team has been consulted and the patient has been started on GDMT. He is being diuresed.  Cardiology was consulted. They have recommended a right and a left heart catheterization, but want the patient to undergo EGD first to rule out any potential issue with anticoagulation or antithrombotics should they become necessary.  The patient underwent EGD with Dr. Rosalie. There was no bleeding noted. He then underwent catheterization (right and left heat) with Dr. Barbaraann. It demonstrated cardiomyopathy out of proportion to CAD. 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  There is also evidence of right heart failure.   Plan is for PICC placement and milrinone infusion. The beta blocker was discontinued. He was placed on digoxin for rate control. Amiodarone could also be added for atrial fibrillation.   The patient will require further optimization of GDMT. Plan is for a TEE/cardioversion on Monday or Tuesday.  Assessment and Plan: Volume overload Patient with pulmonary vascular congestion, lower extremity edema and ascites on presentation to the  ED. He states that all of this started in the last week. The patient now appears euvolemic. The recommendation from cardiology is to hold lasix.  Essential hypertension Blood pressure upon presentation was elevated at 152/108. The patient is on losartan , metoprolol, and amlodipine  as outpatient. Amlodipine  and metoprolol have been held. Plan is also to transition from losartan  to entresto.  Acute exacerbation of CHF (congestive heart failure) (HCC) BNP is elevated at 2000, CXR demonstrates pulmonary vascular congestion, and the patient has lower extremity edema. Echocardiogram has been performed and has demonstrated an EF of 25%. The heart failure team has been consulted. Recommendation is to hold further diuresis. Left and right heart catheterization were performed on 07/26/2024. They demonstrated some element of right heart failure with cardiomyopathy that was out of scale to the degree of CAD present. It is felt that the CMO is due to arrhythmia. Plan is for Milrinone gtt via PICC then TEE and DCCV early next week.  Tobacco dependence Patient states that he no longer smokes tobacco. He does smoke pot.  Asthma Noted. No wheezing presently. As needed albuterol  is available.  Paroxysmal atrial fibrillation (HCC) New finding of atrial fibrillation. Rate is controlled. New onset. Plan is for TEE and cardioversion early next week.  Hepatic cirrhosis (HCC) Finding on CT of the abdomen and pelvis. The patient states that he had been a heavy drinker up until a hear ago. He now states that he only drinks a couple of times a week. CT identifies a cirrhotic liver and minimal ascites.     Subjective: The patient states that he is feeling somewhat better.  Physical Exam:  Vitals:   07/26/24 1110 07/26/24 1154 07/26/24 1419 07/26/24 1653  BP: 105/78 (!) 127/111  (!) 136/119  Pulse: (!) 120 (!) 53  95  Resp: (!) 31 17  18   Temp:  (!) 97.2 F (36.2 C)  98.3 F (36.8 C)  TempSrc:  Oral  Oral  SpO2:  97% 98%  100%  Weight:   85.7 kg   Height:       Exam:  Constitutional:  The patient is awake, alert, and oriented x 3. No acute distress. ERespiratory:  No increased work of breathing. No wheezes, rales, or rhonchi No tactile fremitus Cardiovascular:  Regular rate and rhythm No murmurs, ectopy, or gallups. No lateral PMI. No thrills. Abdomen:  Abdomen is soft, non-tender, non-distended No hernias, masses, or organomegaly Normoactive bowel sounds.  Musculoskeletal:  No cyanosis, clubbing Positive for lower extremity edema Skin:  No rashes, lesions, ulcers palpation of skin: no induration or nodules Neurologic:  CN 2-12 intact Sensation all 4 extremities intact Psychiatric:  Mental status Mood, affect appropriate Orientation to person, place, time  judgment and insight appear intact  Data Reviewed:  CBC, BMP  Family Communication: None available  Disposition: Status is: Inpatient Remains inpatient appropriate because: new diagnosis of severe CHF requiring subspecialty consult and volume management  Planned Discharge Destination: Home    Time spent: 38 minutes  Author: Marlys Stegmaier, DO 07/26/2024 6:12 PM  For on call review [email or web address].

## 2024-07-26 NOTE — Progress Notes (Signed)
 Mobility Specialist Progress Note:    07/26/24 1135  Mobility  Activity Ambulated independently  Level of Assistance Standby assist, set-up cues, supervision of patient - no hands on  Assistive Device None  Distance Ambulated (ft) 500 ft  Activity Response Tolerated well  Mobility Referral Yes  Mobility visit 1 Mobility  Mobility Specialist Start Time (ACUTE ONLY) 1135  Mobility Specialist Stop Time (ACUTE ONLY) 1150  Mobility Specialist Time Calculation (min) (ACUTE ONLY) 15 min   Received pt standing in doorway of room agreeable to session. No c/o any symptoms and stated they are feeling better than ever. Moving and ambulating well. Returned pt to room w/ all needs met.  Venetia Keel Mobility Specialist Please Neurosurgeon or Rehab Office at [phone]

## 2024-07-26 NOTE — Progress Notes (Signed)
 Care order placed to notify cardiology when PICC is placed (at which time Dr. Barbaraann recommends starting milrinone @ 0.25).

## 2024-07-26 NOTE — Progress Notes (Signed)
   Heart Failure Stewardship Pharmacist Progress Note   PCP: Leontine Cramp, NP PCP-Cardiologist: Darryle ONEIDA Decent, MD    HPI:  63 yo M with PMH of HTN, colon cancer, COPD, history of tobacco use, and history of heavy alcohol use.   Presented to the ED on 10/26 with shortness of breath, LE edema, abdominal pain and distention for two weeks. CXR with cardiomegaly and central venous congestion. BNP [id number]. Found to be in rate controlled afib. ECHO 10/28 with LVEF 25-30%, RWMA, mild LVH, RV normal, moderate to severe MR, severely dilated pulmonary artery.   EGD today prior to cath and TEE/DCCV. May have cardioversion completed as outpatient per cardiology.   Patient reports he has completed EGD and has his cath procedure at 1 today. Up out of bed and walking around the unit easily. Still has abdominal distention.   Current HF Medications: Diuretic: furosemide 40 mg PO daily Beta Blocker: metoprolol succinate 25 mg daily ACE/ARB/ARNI: Entresto 24/26 mg BID MRA: spironolactone 25 mg daily  Prior to admission HF Medications: ACE/ARB/ARNI: losartan  50 mg daily  Pertinent Lab Values: Serum creatinine 1.31, BUN 13, Potassium 4.0, Sodium 137, BNP 1405, Magnesium 1.9  Vital Signs: Weight: 189 lbs (admission weight: 199 lbs) Blood pressure: 120-130/90s  Heart rate: 90-100s  I/O: net -0.8L yesterday; net -17.5L since admission  Medication Assistance / Insurance Benefits Check: Does the patient have prescription insurance?  Yes Type of insurance plan: UHC Medicare + Medicaid  Outpatient Pharmacy:  Prior to admission outpatient pharmacy: [facility] Is the patient willing to use [facility] T[facility] pharmacy at discharge? Yes Is the patient willing to transition their outpatient pharmacy to utilize a [facility] outpatient pharmacy?   No   Assessment: 1. Acute systolic CHF (LVEF 25-30%), due to unknown etiology - cath later today. NYHA class II symptoms. - Agree with transitioning to  furosemide 40 mg PO daily. Strict I/Os and daily weights. Keep K>4 and Mg>2.  - Continue metoprolol succinate 25 mg daily - Agree with transitioning to Entresto 24/26 mg BID - Continue spironolactone 25 mg daily - Holding Jardiance 10 mg daily with planned procedures   Plan: 1) Medication changes recommended at this time: - Restart Jardiance after procedures today  2) Patient assistance: - Entresto copay $0 - Farxiga/Jardiance copay $0  3)  Education  - Patient has been educated on current HF medications and potential additions to HF medication regimen - Patient verbalizes understanding that over the next few months, these medication doses may change and more medications may be added to optimize HF regimen - Patient has been educated on basic disease state pathophysiology and goals of therapy   Duwaine Plant, PharmD, BCPS Heart Failure Stewardship Pharmacist Phone [phone]

## 2024-07-26 NOTE — Transfer of Care (Signed)
 Immediate Anesthesia Transfer of Care Note  Patient: Benjamin Abbott  Procedure(s) Performed: EGD (ESOPHAGOGASTRODUODENOSCOPY)  Patient Location: PACU  Anesthesia Type:MAC  Level of Consciousness: drowsy and patient cooperative  Airway & Oxygen Therapy: Patient connected to face mask oxygen  Post-op Assessment: Report given to RN and Post -op Vital signs reviewed and stable  Post vital signs: Reviewed and stable  Last Vitals:  Vitals Value Taken Time  BP 93/61 07/26/24 10:40  Temp 36.6 C 07/26/24 10:33  Pulse 80 07/26/24 10:43  Resp 22 07/26/24 10:43  SpO2 99 % 07/26/24 10:43  Vitals shown include unfiled device data.  Last Pain:  Vitals:   07/26/24 1033  TempSrc: Temporal  PainSc: Asleep      Patients Stated Pain Goal: 0 (07/24/24 2243)  Complications: No notable events documented.

## 2024-07-26 NOTE — Progress Notes (Signed)
 Fonda, RN made aware that PICC will not be placed until tomorrow as PICC nurse is off campus and will not be returning to [employer].

## 2024-07-26 NOTE — Progress Notes (Signed)
 PHARMACY - ANTICOAGULATION CONSULT NOTE  Pharmacy Consult for heparin Indication: atrial fibrillation  Allergies  Allergen Reactions   Gadolinium Derivatives Shortness Of Breath    Bronchospasms-Pt asthmatic and had albuterol  inhaler, which he used and it broke the spasms. 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,RN    Patient Measurements: Height: 5' 9 (175.3 cm) Weight: 84.4 kg (186 lb 1.6 oz) IBW/kg (Calculated) : 70.7 HEPARIN DW (KG): 86.2  Vital Signs: Temp: 97.8 F (36.6 C) (10/31 0740) Temp Source: Oral (10/31 0740) BP: 129/103 (10/31 0740) Pulse Rate: 81 (10/31 0740)  Labs: Recent Labs    07/24/24 0219 07/25/24 0222 07/26/24 0631  HGB 13.5  --  13.4  HCT 40.1  --  40.1  PLT 199  --  190  APTT  --   --  >200*  LABPROT  --   --  15.1  INR  --   --  1.1  HEPARINUNFRC  --   --  >1.10*  CREATININE 1.35* 1.39* 1.31*    Estimated Creatinine Clearance: 59.2 mL/min (A) (by C-G formula based on SCr of 1.31 mg/dL (H)).   Medical History: Past Medical History:  Diagnosis Date   Arthritis    Asthma    Back pain    Chronic pain    Colon cancer (HCC)    COPD (chronic obstructive pulmonary disease) (HCC)    Dyspnea    with exertion   GERD (gastroesophageal reflux disease)    Hypertension    Knee pain    Neuropathy    Obesity    Sleep apnea    cpap      Assessment: 27 yoM admitted with new CHF. Pt on apixaban PTA for hx AF, will switch to IV heparin with need for cath. Last dose of apixaban was ~0915 10/30, will need to dose via aPTTs initially.  aPTT>200, heparin level >1.1. Unclear if labs accurate (drawn from different sites but same arm). Will pause heparin x1 hour and resume at lower rate.  Goal of Therapy:  Heparin level 0.3-0.7 units/ml aPTT 66-102 seconds Monitor platelets by anticoagulation protocol: Yes   Plan:  Hold heparin x1 hour Reduce heparin to 1000 units/h Repeat aPTT in 8h  Ozell Jamaica, PharmD, [place],  [facility] Clinical Pharmacist [phone] Please check AMION for all [facility] numbers 07/26/2024

## 2024-07-27 ENCOUNTER — Inpatient Hospital Stay (HOSPITAL_COMMUNITY)

## 2024-07-27 ENCOUNTER — Encounter (HOSPITAL_COMMUNITY): Payer: Self-pay | Admitting: Cardiology

## 2024-07-27 DIAGNOSIS — I5021 Acute systolic (congestive) heart failure: Secondary | ICD-10-CM | POA: Diagnosis not present

## 2024-07-27 DIAGNOSIS — I509 Heart failure, unspecified: Secondary | ICD-10-CM | POA: Diagnosis not present

## 2024-07-27 DIAGNOSIS — I4819 Other persistent atrial fibrillation: Secondary | ICD-10-CM | POA: Diagnosis not present

## 2024-07-27 LAB — BASIC METABOLIC PANEL WITH GFR
Anion gap: 11 (ref 5–15)
BUN: 16 mg/dL (ref 8–23)
CO2: 24 mmol/L (ref 22–32)
Calcium: 8.3 mg/dL — ABNORMAL LOW (ref 8.9–10.3)
Chloride: 105 mmol/L (ref 98–111)
Creatinine, Ser: 1.39 mg/dL — ABNORMAL HIGH (ref 0.61–1.24)
GFR, Estimated: 58 mL/min — ABNORMAL LOW (ref >60)
Glucose, Bld: 133 mg/dL — ABNORMAL HIGH (ref 70–99)
Potassium: 3.8 mmol/L (ref 3.5–5.1)
Sodium: 140 mmol/L (ref 135–145)

## 2024-07-27 LAB — CBC
HCT: 40.7 % (ref 39.0–52.0)
Hemoglobin: 13.8 g/dL (ref 13.0–17.0)
MCH: 29.2 pg (ref 26.0–34.0)
MCHC: 33.9 g/dL (ref 30.0–36.0)
MCV: 86 fL (ref 80.0–100.0)
Platelets: 197 K/uL (ref 150–400)
RBC: 4.73 MIL/uL (ref 4.22–5.81)
RDW: 15.8 % — ABNORMAL HIGH (ref 11.5–15.5)
WBC: 7.1 K/uL (ref 4.0–10.5)
nRBC: 0 % (ref 0.0–0.2)

## 2024-07-27 LAB — HEPARIN LEVEL (UNFRACTIONATED): Heparin Unfractionated: <0.1 [IU]/mL — ABNORMAL LOW (ref 0.30–0.70)

## 2024-07-27 LAB — APTT: aPTT: 29 s (ref 24–36)

## 2024-07-27 LAB — COOXEMETRY PANEL
Carboxyhemoglobin: 1.6 % — ABNORMAL HIGH (ref 0.5–1.5)
Methemoglobin: <0.7 % (ref 0.0–1.5)
O2 Saturation: 61.7 %
Total hemoglobin: 14.3 g/dL (ref 12.0–16.0)

## 2024-07-27 MED ORDER — CHLORHEXIDINE GLUCONATE CLOTH 2 % EX PADS
6.0000 | MEDICATED_PAD | Freq: Every day | CUTANEOUS | Status: DC
  Administered 2024-07-27 – 2024-08-01 (×6): 6 via TOPICAL

## 2024-07-27 MED ORDER — LOSARTAN POTASSIUM 25 MG PO TABS
12.5000 mg | ORAL_TABLET | Freq: Every day | ORAL | Status: DC
  Administered 2024-07-27 – 2024-07-28 (×2): 12.5 mg via ORAL
  Filled 2024-07-27 (×2): qty 1

## 2024-07-27 MED ORDER — SODIUM CHLORIDE 0.9% FLUSH: 10.0000 mL | INTRAVENOUS | Status: DC | PRN

## 2024-07-27 MED ORDER — SODIUM CHLORIDE 0.9% FLUSH
10.0000 mL | Freq: Two times a day (BID) | INTRAVENOUS | Status: DC
  Administered 2024-07-27: 30 mL
  Administered 2024-07-27: 10 mL
  Administered 2024-07-28: 20 mL
  Administered 2024-07-28 – 2024-07-29 (×2): 10 mL
  Administered 2024-07-29: 20 mL
  Administered 2024-07-30 – 2024-08-01 (×5): 10 mL

## 2024-07-27 MED ORDER — EMPAGLIFLOZIN 10 MG PO TABS
10.0000 mg | ORAL_TABLET | Freq: Every day | ORAL | Status: DC
  Administered 2024-07-28 – 2024-08-01 (×5): 10 mg via ORAL
  Filled 2024-07-27 (×5): qty 1

## 2024-07-27 NOTE — Plan of Care (Signed)

## 2024-07-27 NOTE — Progress Notes (Signed)
 Peripherally Inserted Central Catheter Placement  The IV Nurse has discussed with the patient and/or persons authorized to consent for the patient, the purpose of this procedure and the potential benefits and risks involved with this procedure.  The benefits include less needle sticks, lab draws from the catheter, and the patient may be discharged home with the catheter. Risks include, but not limited to, infection, bleeding, blood clot (thrombus formation), and puncture of an artery; nerve damage and irregular heartbeat and possibility to perform a PICC exchange if needed/ordered by physician.  Alternatives to this procedure were also discussed.  Bard Power PICC patient education guide, fact sheet on infection prevention and patient information card has been provided to patient /or left at bedside. Obtained telephone consent from his sister Ronal at the bedside.    PICC Placement Documentation  PICC Triple Lumen 07/27/24 Right Brachial 39 cm 0 cm (Active)  Indication for Insertion or Continuance of Line Vasoactive infusions 07/27/24 0857  Exposed Catheter (cm) 0 cm 07/27/24 0857  Site Assessment Clean, Dry, Intact 07/27/24 0857  Lumen #1 Status Flushed;Saline locked;Blood return noted 07/27/24 0857  Lumen #2 Status Flushed;Saline locked;Blood return noted 07/27/24 0857  Lumen #3 Status Flushed;Saline locked;Blood return noted 07/27/24 0857  Dressing Type Transparent;Securing device 07/27/24 0857  Dressing Status Antimicrobial disc/dressing in place;Clean, Dry, Intact 07/27/24 0857  Line Care Connections checked and tightened 07/27/24 0857  Line Adjustment (NICU/IV Team Only) No 07/27/24 0857  Dressing Intervention New dressing;Adhesive placed at insertion site (IV team only) 07/27/24 0857  Dressing Change Due 08/03/24 07/27/24 0857       Hermilo Dutter Sheral Ruth 07/27/2024, 8:59 AM

## 2024-07-27 NOTE — Plan of Care (Signed)
  Problem: Education: Goal: Knowledge of General Education information will improve Description: Including pain rating scale, medication(s)/side effects and non-pharmacologic comfort measures Outcome: Not Progressing   Problem: Health Behavior/Discharge Planning: Goal: Ability to manage health-related needs will improve Outcome: Not Progressing   Problem: Clinical Measurements: Goal: Ability to maintain clinical measurements within normal limits will improve Outcome: Not Progressing Goal: Will remain free from infection Outcome: Not Progressing Goal: Diagnostic test results will improve Outcome: Not Progressing Goal: Respiratory complications will improve Outcome: Not Progressing Goal: Cardiovascular complication will be avoided Outcome: Not Progressing   Problem: Nutrition: Goal: Adequate nutrition will be maintained Outcome: Not Progressing   Problem: Coping: Goal: Level of anxiety will decrease Outcome: Not Progressing   Problem: Elimination: Goal: Will not experience complications related to bowel motility Outcome: Not Progressing Goal: Will not experience complications related to urinary retention Outcome: Not Progressing   Problem: Pain Managment: Goal: General experience of comfort will improve and/or be controlled Outcome: Not Progressing   Problem: Education: Goal: Ability to demonstrate management of disease process will improve Outcome: Not Progressing Goal: Ability to verbalize understanding of medication therapies will improve Outcome: Not Progressing Goal: Individualized Educational Video(s) Outcome: Not Progressing

## 2024-07-27 NOTE — Consult Note (Signed)
 Advanced Heart Failure Team Consult Note   Primary Physician: Leontine Cramp, NP Cardiologist:  Darryle ONEIDA Decent, MD  Reason for Consultation: systolic heart failure  HPI:    Benjamin Abbott is seen today for evaluation of systolic heart failure at the request of Dr. Decent.   Patient was seen today after right and left heart catheterization yesterday showed severely reduced cardiac index, mildly elevated filling pressures, no significant coronary disease.  Patient has a past medical history of substance use with prior use of alcohol, cocaine, and marijuana.  Who presented to the emergency department on 10/26 complaining of abdominal pain, bilateral lower extremity edema.  He was found to be in new atrial fibrillation.  Reports that his symptoms have been going on for about a month.  His shortness of breath has precipitously worsened over that time.  Has a history of significant alcohol use.  Initial workup showed coronary calcifications, liver changes concerning for cirrhosis.  Echocardiogram showed reduced LVEF 25 to 30%, moderate to severe MR, moderate to severe TR, and A-fib with RVR.  He is feeling much better after being diuresed, some residual congestion in his upper airways but otherwise no complaints.   Objective:    Vital Signs:   Temp:  [97.7 F (36.5 C)-98.2 F (36.8 C)] 98.2 F (36.8 C) (11/01 1950) Pulse Rate:  [53-94] 92 (11/01 1634) Resp:  [19-22] 20 (11/01 1950) BP: (119-166)/(77-116) 119/95 (11/01 1950) SpO2:  [100 %] 100 % (11/01 1634) Weight:  [86.2 kg] 86.2 kg (11/01 0500) Last BM Date : 07/26/24  Weight change: Filed Weights   07/26/24 0933 07/26/24 1419 07/27/24 0500  Weight: 85.7 kg 85.7 kg 86.2 kg    Intake/Output:   Intake/Output Summary (Last 24 hours) at 07/27/2024 2157 Last data filed at 07/27/2024 2000 Gross per 24 hour  Intake 616 ml  Output 1250 ml  Net -634 ml      Physical Exam    GENERAL: NAD, fair appearing PULM:  Normal work of  breathing, CTAB CARDIAC:  JVP: Mildly elevated         Irregular rate and rhythm, systolic murmur , trace edema. Warm and well perfused extremities. ABDOMEN: Soft, non-tender, non-distended. NEUROLOGIC: Patient is oriented x3 with no focal or lateralizing neurologic deficits.    Telemetry   A-fib with RVR in the 110s   Labs   Basic Metabolic Panel: Recent Labs  Lab 07/21/24 1424 07/22/24 1844 07/23/24 0221 07/24/24 9780 07/25/24 0222 07/26/24 0631 07/26/24 1601 07/26/24 1602 07/26/24 1618 07/27/24 0233  NA 139 137 137 135 137 137 140 142 128*  136 140  K 4.3 4.3 3.7 3.4* 3.6 4.0 4.2 4.3 3.5  4.1 3.8  CL 111 106 107 103 101 104  --   --   --  105  CO2 21* 21* 22 24 24 22   --   --   --  24  GLUCOSE 89 102* 97 120* 141* 113*  --   --   --  133*  BUN 23 18 20 20 19 13   --   --   --  16  CREATININE 1.24 1.20 1.16 1.35* 1.39* 1.31*  --   --   --  1.39*  CALCIUM 9.0 8.7* 8.8* 8.7* 8.8* 8.1*  --   --   --  8.3*  MG 2.0 1.9  --   --   --   --   --   --   --   --  Liver Function Tests: Recent Labs  Lab 07/21/24 1424 07/22/24 1844  AST 44* 46*  ALT 76* 78*  ALKPHOS 76 69  BILITOT 0.7 1.3*  PROT 5.9* 6.0*  ALBUMIN 3.6 3.2*   Recent Labs  Lab 07/21/24 1424  LIPASE 15   No results for input(s): AMMONIA in the last 168 hours.  CBC: Recent Labs  Lab 07/21/24 1424 07/22/24 1844 07/24/24 0219 07/26/24 0631 07/26/24 1601 07/26/24 1602 07/26/24 1618 07/27/24 0233  WBC 8.2 8.5 8.0 8.2  --   --   --  7.1  NEUTROABS 5.0 5.3 4.7  --   --   --   --   --   HGB 12.6* 14.0 13.5 13.4 14.3 14.3 13.6  13.9 13.8  HCT 38.5* 41.9 40.1 40.1 42.0 42.0 40.0  41.0 40.7  MCV 85.2 83.3 83.2 84.8  --   --   --  86.0  PLT 187 214 199 190  --   --   --  197    Cardiac Enzymes: No results for input(s): CKTOTAL, CKMB, CKMBINDEX, TROPONINI in the last 168 hours.  BNP: BNP (last 3 results) Recent Labs    07/22/24 1844  BNP 1,405.0*    ProBNP (last 3  results) Recent Labs    07/21/24 1424  PROBNP 2,285.0*     CBG: No results for input(s): GLUCAP in the last 168 hours.  Coagulation Studies: Recent Labs    07/26/24 0631  LABPROT 15.1  INR 1.1     Medications:     Current Medications:  apixaban  5 mg Oral BID   Chlorhexidine  Gluconate Cloth  6 each Topical Daily   cholecalciferol  2,000 Units Oral Daily   digoxin  0.125 mg Oral Daily   famotidine  20 mg Oral BID   fluticasone  furoate-vilanterol  1 puff Inhalation Daily   gabapentin   300 mg Oral TID   hydrOXYzine  25 mg Oral QHS   loratadine  10 mg Oral Daily   losartan   12.5 mg Oral Daily   methocarbamol   500 mg Oral BID   pantoprazole  40 mg Oral Daily   sodium chloride  flush  10-40 mL Intracatheter Q12H   sodium chloride  flush  3 mL Intravenous Q12H   sodium chloride  flush  3 mL Intravenous Q12H    Infusions:     Patient Profile   Patient with a past medical history of substance use and heavy alcohol use.  Presented to the emergency department and found to be in case acute systolic heart failure with new A-fib with RVR.  Assessment/Plan    Acute systolic heart failure: Moderate, single-vessel CAD in the RCA, predominant nonischemic cardiomyopathy.  Suspect etiology related to tachyarrhythmia, though heavy alcohol use could also be considered.  While his index was severely reduced, his endorgan function is relatively stable, filling pressures near normal, and central venous sat now 62 from 46 yesterday.  No urgent need for inotropes, will instead work on titrating medical therapy and tentatively plan for TEE/DCCV early next week. - No indication for inotropes with stable endorgan function - Start losartan  12.5 mg daily, hopefully increase tomorrow - Jardiance 10mg  daily - Hold on BB - Spironolactone tomorrow if renal function stable - Oral diuretics tomorrow - TEE/DCCV tomorrow  Persistent atrial fibrillation: Rates improving.  - Holding on  amiodarone with liver disease - TEE/DCCV early next week  Cirrhosis: - Suspect due to alcohol use - Synthetic function appears intact, GI following  Substance use: - Prior THC,  cocaine - Heavy alcohol use    Length of Stay: 5  Morene JINNY Brownie, MD  07/27/2024, 9:57 PM    Advanced Heart Failure Team Pager [phone] (M-F; 7a - 5p)  Please contact [facility] for night-coverage after hours (4p -7a ) and weekends on [email or web address]

## 2024-07-27 NOTE — Progress Notes (Addendum)
 PROGRESS NOTE    Benjamin Abbott  FMW:[id number] DOB: [birth year] DOA: 07/21/2024 PCP: Leontine Cramp, NP  Chief Complaint  Patient presents with   Abdominal Pain    Brief Narrative:   63 yr old man who presented to the ED at [facility] for complaints of abdominal bloated and swelling in h is lower extremities as well as shortness of breath. In the ED the patient was found to have Jaquasia Doscher BNP of 2000, Shloma Roggenkamp new finding of atrial fibrillation with rate control, and pulmonary vascular congestion. He also was found to have Asmara Backs protuberant abdomen with swelling in his legs bilaterally.   CXR demonstrated pulmonary vascular congestion, CT of the abdomen and pelvis demonstrated ascites and hepatic cirrhosis.   The patient was transferred to [facility] for admission. Echocatdiogram was obtained that demonstrated severe systolic CHF with EF of 25%.    Cardiology was consulted. They recommended Quinnten Calvin right and Gavin Faivre left heart catheterization, but want the patient to undergo EGD first to rule out any potential issue with anticoagulation or antithrombotics should they become necessary.   The patient underwent EGD with Dr. Rosalie. There was no bleeding noted. He then underwent catheterization (right and left heat) with Dr. Barbaraann. It demonstrated cardiomyopathy out of proportion to CAD. 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  There is also evidence of right heart failure.    Plan is for PICC placement and milrinone infusion. The beta blocker was discontinued. He was placed on digoxin for rate control. Amiodarone could also be added for atrial fibrillation.    The patient will require further optimization of GDMT. Plan is for Isauro Skelley TEE/cardioversion on Monday or Tuesday.  Assessment & Plan:   Principal Problem:   Acute exacerbation of CHF (congestive heart failure) (HCC) Active Problems:   Asthma   Tobacco dependence   Essential hypertension   Volume overload   Paroxysmal atrial fibrillation (HCC)    Hepatic cirrhosis (HCC)   Elevated troponin   Acute systolic heart failure (HCC)   Atrial fibrillation with RVR (HCC)   Demand ischemia (HCC)  HFrEF Cirrhosis Volume Overload  CXR today without focal pulm opacity US  ascites with small volume simple ascites in r upper and right lower quadrants S/p R/LHC with moderate to severe 2 vessel disease, nonischemic cardiomyopathy with severe biventricular failure.   Now s/p PICC placement.  Should be seen by HF team today.  Looks like plan was for milrinone. Strict I/O, daily weights  Atrial Fibrillation  Eliquis  Digoxin  Plan is for TEE/cardioversion Monday or Tuesday  Elevated Creatinine Not reaching criteria for AKI Will monitor closely Baseline appears to be ~1.16 Cirrhosis Unclear what outpatient follow up he has for this Repeat US  to determine whether sufficient ascites for diagnostic para  Hypertension Amlodipine  on hold Appears she's not taking losartan   Asthma No si/sx exacerbation    DVT prophylaxis: eliquis Code Status: full Family Communication: none Disposition:   Status is: Inpatient Remains inpatient appropriate because: need for continued cardiology management   Consultants:  Cardiology GI  Procedures:  R/LHC Conclusion: Moderate to severe two vessel disease Compensated nonischemic cardiomyopathy with severe biventricular failure   Recommendation: Continue GDMT for HFrEF Consider advanced heart failure team consult Ordered to start Eliquis tomorrow morning  Echo IMPRESSIONS     1. Left ventricular ejection fraction, by estimation, is 25 to 30%. The  left ventricle has severely decreased function. The left ventricle  demonstrates regional wall motion abnormalities (see scoring  diagram/findings for description). The left  ventricular internal cavity size was moderately dilated. There is mild  left ventricular hypertrophy. Left ventricular diastolic parameters are  indeterminate. LAD akinesis  with no LV thrombus.   2. Right ventricular systolic function is normal. The right ventricular  size is mildly enlarged. There is normal pulmonary artery systolic  pressure.   3. Left atrial size was mildly dilated.   4. Right atrial size was moderately dilated.   5. The mitral valve is normal in structure. Moderate to severe mitral  valve regurgitation. No evidence of mitral stenosis.   6. Tricuspid valve regurgitation is moderate to severe.   7. The aortic valve is tricuspid. Aortic valve regurgitation is trivial.  No aortic stenosis is present.   8. Severely dilated pulmonary artery.   9. The inferior vena cava is normal in size with greater than 50%  respiratory variability, suggesting right atrial pressure of 3 mmHg.   Comparison(s): No prior Echocardiogram.   Conclusion(s)/Recommendation(s): Reaching out to Dr. [name].   EGD Normal esophagus, small amount of food residue in stomach, normal stomach, normal duodenal buld, first portion of duodenum, second portion of the duodenum, third portion of duodenum and 4th portion of duodenum   Antimicrobials:  Anti-infectives (From admission, onward)    None       Subjective: No new complaints  Objective: Vitals:   07/27/24 0711 07/27/24 0746 07/27/24 0930 07/27/24 1211  BP: 124/77   (!) 166/116  Pulse: 94  94 (!) 53  Resp: 20   (!) 22  Temp: 97.7 F (36.5 C)   98.2 F (36.8 C)  TempSrc: Oral   Oral  SpO2:  100%  100%  Weight:      Height:        Intake/Output Summary (Last 24 hours) at 07/27/2024 1446 Last data filed at 07/27/2024 1100 Gross per 24 hour  Intake 1360.66 ml  Output 1075 ml  Net 285.66 ml   Filed Weights   07/26/24 0933 07/26/24 1419 07/27/24 0500  Weight: 85.7 kg 85.7 kg 86.2 kg    Examination:  General exam: Appears calm and comfortable  Respiratory system: Clear to auscultation. Respiratory effort normal. Cardiovascular system: RRR Gastrointestinal system: distended abdomen Central nervous  system: Alert and oriented. No focal neurological deficits. Extremities: bilateral le edema  Data Reviewed: I have personally reviewed following labs and imaging studies  CBC: Recent Labs  Lab 07/21/24 1424 07/22/24 1844 07/24/24 0219 07/26/24 0631 07/26/24 1601 07/26/24 1602 07/26/24 1618 07/27/24 0233  WBC 8.2 8.5 8.0 8.2  --   --   --  7.1  NEUTROABS 5.0 5.3 4.7  --   --   --   --   --   HGB 12.6* 14.0 13.5 13.4 14.3 14.3 13.6  13.9 13.8  HCT 38.5* 41.9 40.1 40.1 42.0 42.0 40.0  41.0 40.7  MCV 85.2 83.3 83.2 84.8  --   --   --  86.0  PLT 187 214 199 190  --   --   --  197    Basic Metabolic Panel: Recent Labs  Lab 07/21/24 1424 07/22/24 1844 07/23/24 0221 07/24/24 0219 07/25/24 0222 07/26/24 0631 07/26/24 1601 07/26/24 1602 07/26/24 1618 07/27/24 0233  NA 139 137 137 135 137 137 140 142 128*  136 140  K 4.3 4.3 3.7 3.4* 3.6 4.0 4.2 4.3 3.5  4.1 3.8  CL 111 106 107 103 101 104  --   --   --  105  CO2 21* 21* 22 24 24 22   --   --   --  24  GLUCOSE 89 102* 97 120* 141* 113*  --   --   --  133*  BUN 23 18 20 20 19 13   --   --   --  16  CREATININE 1.24 1.20 1.16 1.35* 1.39* 1.31*  --   --   --  1.39*  CALCIUM 9.0 8.7* 8.8* 8.7* 8.8* 8.1*  --   --   --  8.3*  MG 2.0 1.9  --   --   --   --   --   --   --   --     GFR: Estimated Creatinine Clearance: 60.7 mL/min (Therisa Mennella) (by C-G formula based on SCr of 1.39 mg/dL (H)).  Liver Function Tests: Recent Labs  Lab 07/21/24 1424 07/22/24 1844  AST 44* 46*  ALT 76* 78*  ALKPHOS 76 69  BILITOT 0.7 1.3*  PROT 5.9* 6.0*  ALBUMIN 3.6 3.2*    CBG: No results for input(s): GLUCAP in the last 168 hours.   No results found for this or any previous visit (from the past 240 hours).       Radiology Studies: DG CHEST PORT 1 VIEW Result Date: 07/27/2024 EXAM: 1 VIEW(S) XRAY OF THE CHEST 07/27/2024 09:27:00 AM COMPARISON: 07/21/2024 CLINICAL HISTORY: Status post PICC central line placement FINDINGS: LINES, TUBES AND  DEVICES: Right upper extremity PICC in place with tip in superior vena cava. LUNGS AND PLEURA: No focal pulmonary opacity. No pulmonary edema. No pleural effusion. No pneumothorax. HEART AND MEDIASTINUM: Stable cardiomegaly. BONES AND SOFT TISSUES: No acute osseous abnormality. IMPRESSION: 1. Right upper extremity PICC in place with tip in superior vena cava. 2. Stable cardiomegaly. Electronically signed by: Evalene Coho MD 07/27/2024 09:40 AM EDT RP Workstation: [id number]   US  EKG SITE RITE Result Date: 07/26/2024 If Site Rite image not attached, placement could not be confirmed due to current cardiac rhythm.  CARDIAC CATHETERIZATION Result Date: 07/26/2024 Images from the original result were not included. Coronary angiography 07/26/2024: LM: Normal LAD: Prox 40% disease Ramus: No significant disease Lcx: Codominant. No significant disease RCA: Small to medium caliber, codominant.          Prox 80%, mid 70% calcific stenoses LVEDP 9 mmHg Right heart catheterization 07/26/2024: RA: 11 mmHg RV: 40/3 mmHg PA: 37/23 mmHg, mPAP 28 mmHg PCW: 16 mmHg AO sats: 93% PA sats: 46% CO: 3.1 L/min CI: 1.5 L/min/m2 PAPi 1. 2 Conclusion: Moderate to severe two vessel disease Compensated nonischemic cardiomyopathy with severe biventricular failure Recommendation: Continue GDMT for HFrEF Consider advanced heart failure team consult Ordered to start Eliquis tomorrow morning Manish JINNY Lawrence, MD        Scheduled Meds:  apixaban  5 mg Oral BID   Chlorhexidine  Gluconate Cloth  6 each Topical Daily   cholecalciferol  2,000 Units Oral Daily   digoxin  0.125 mg Oral Daily   famotidine  20 mg Oral BID   fluticasone  furoate-vilanterol  1 puff Inhalation Daily   gabapentin   300 mg Oral TID   hydrOXYzine  25 mg Oral QHS   loratadine  10 mg Oral Daily   methocarbamol   500 mg Oral BID   pantoprazole  40 mg Oral Daily   sodium chloride  flush  10-40 mL Intracatheter Q12H   sodium chloride  flush  3 mL Intravenous  Q12H   sodium chloride  flush  3 mL Intravenous Q12H   Continuous Infusions:  sodium chloride        LOS: 5 days    Time spent:  over 30 min     Meliton Monte, MD [facility]   To contact the attending provider between 7A-7P or the covering provider during after hours 7P-7A, please log into the web site [email or web address] and access using universal [place] password for that web site. If you do not have the password, please call the hospital operator.  07/27/2024, 2:46 PM

## 2024-07-27 NOTE — Evaluation (Signed)
 6ccs air remains in TR band at right radial. 2 ccs released per 15 mins. Deflation uneventful. Gauze and Tegaderm applied to site.

## 2024-07-28 ENCOUNTER — Encounter (HOSPITAL_COMMUNITY): Payer: Self-pay | Admitting: Gastroenterology

## 2024-07-28 DIAGNOSIS — I4891 Unspecified atrial fibrillation: Secondary | ICD-10-CM | POA: Diagnosis not present

## 2024-07-28 DIAGNOSIS — I5043 Acute on chronic combined systolic (congestive) and diastolic (congestive) heart failure: Secondary | ICD-10-CM | POA: Diagnosis not present

## 2024-07-28 LAB — CBC
HCT: 41.3 % (ref 39.0–52.0)
Hemoglobin: 13.6 g/dL (ref 13.0–17.0)
MCH: 28.3 pg (ref 26.0–34.0)
MCHC: 32.9 g/dL (ref 30.0–36.0)
MCV: 86 fL (ref 80.0–100.0)
Platelets: 193 K/uL (ref 150–400)
RBC: 4.8 MIL/uL (ref 4.22–5.81)
RDW: 15.7 % — ABNORMAL HIGH (ref 11.5–15.5)
WBC: 8.9 K/uL (ref 4.0–10.5)
nRBC: 0 % (ref 0.0–0.2)

## 2024-07-28 LAB — BASIC METABOLIC PANEL WITH GFR
Anion gap: 7 (ref 5–15)
BUN: 21 mg/dL (ref 8–23)
CO2: 24 mmol/L (ref 22–32)
Calcium: 8.6 mg/dL — ABNORMAL LOW (ref 8.9–10.3)
Chloride: 106 mmol/L (ref 98–111)
Creatinine, Ser: 1.31 mg/dL — ABNORMAL HIGH (ref 0.61–1.24)
GFR, Estimated: >60 mL/min (ref >60)
Glucose, Bld: 110 mg/dL — ABNORMAL HIGH (ref 70–99)
Potassium: 4.9 mmol/L (ref 3.5–5.1)
Sodium: 137 mmol/L (ref 135–145)

## 2024-07-28 LAB — MAGNESIUM: Magnesium: 2 mg/dL (ref 1.7–2.4)

## 2024-07-28 LAB — COOXEMETRY PANEL
Carboxyhemoglobin: 1.6 % — ABNORMAL HIGH (ref 0.5–1.5)
Methemoglobin: <0.7 % (ref 0.0–1.5)
O2 Saturation: 71.3 %
Total hemoglobin: 13.9 g/dL (ref 12.0–16.0)

## 2024-07-28 MED ORDER — SACUBITRIL-VALSARTAN 24-26 MG PO TABS
1.0000 | ORAL_TABLET | Freq: Two times a day (BID) | ORAL | Status: DC
  Administered 2024-07-28 – 2024-08-01 (×8): 1 via ORAL
  Filled 2024-07-28 (×8): qty 1

## 2024-07-28 MED ORDER — SODIUM CHLORIDE 0.9 % IV SOLN: INTRAVENOUS | Status: DC

## 2024-07-28 MED ORDER — SPIRONOLACTONE 12.5 MG HALF TABLET
12.5000 mg | ORAL_TABLET | Freq: Every day | ORAL | Status: DC
  Administered 2024-07-28: 12.5 mg via ORAL
  Filled 2024-07-28: qty 1

## 2024-07-28 MED ORDER — FUROSEMIDE 10 MG/ML IJ SOLN
40.0000 mg | Freq: Two times a day (BID) | INTRAMUSCULAR | Status: DC
  Administered 2024-07-28 – 2024-07-29 (×3): 40 mg via INTRAVENOUS
  Filled 2024-07-28 (×3): qty 4

## 2024-07-28 NOTE — Progress Notes (Addendum)
 PROGRESS NOTE    Benjamin Abbott  FMW:[id number] DOB: [birth year] DOA: 07/21/2024 PCP: Leontine Cramp, NP  Chief Complaint  Patient presents with   Abdominal Pain    Brief Narrative:   63 yr old man who presented to the ED at [facility] for complaints of abdominal bloated and swelling in h is lower extremities as well as shortness of breath. In the ED the patient was found to have Jamiria Langill BNP of 2000, Gao Mitnick new finding of atrial fibrillation with rate control, and pulmonary vascular congestion. He also was found to have Letrice Pollok protuberant abdomen with swelling in his legs bilaterally.   CXR demonstrated pulmonary vascular congestion, CT of the abdomen and pelvis demonstrated ascites and hepatic cirrhosis.   The patient was transferred to [facility] for admission. Echocatdiogram was obtained that demonstrated severe systolic CHF with EF of 25%.    Cardiology was consulted. They recommended Eleuterio Dollar right and Eben Choinski left heart catheterization, but want the patient to undergo EGD first to rule out any potential issue with anticoagulation or antithrombotics should they become necessary.   The patient underwent EGD with Dr. Rosalie. There was no bleeding noted. He then underwent catheterization (right and left heat) with Dr. Barbaraann. It demonstrated cardiomyopathy out of proportion to CAD. 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  There is also evidence of right heart failure.    Plan is for PICC placement and milrinone infusion. The beta blocker was discontinued. He was placed on digoxin for rate control. Amiodarone could also be added for atrial fibrillation.    The patient will require further optimization of GDMT. Plan is for Madelina Sanda TEE/cardioversion on Monday or Tuesday.  Assessment & Plan:   Principal Problem:   Congestive heart failure (HCC) Active Problems:   Asthma   Tobacco dependence   Essential hypertension   Volume overload   Paroxysmal atrial fibrillation (HCC)   Hepatic cirrhosis (HCC)    Elevated troponin   Acute systolic heart failure (HCC)   Atrial fibrillation with RVR (HCC)   Demand ischemia (HCC)  HFrEF Cirrhosis Volume Overload  CXR today without focal pulm opacity US  ascites with small volume simple ascites in r upper and right lower quadrants S/p R/LHC with moderate to severe 2 vessel disease, nonischemic cardiomyopathy with severe biventricular failure.   Now s/p PICC placement.  HF team recommending losartan , jardiance - consider diuretics Strict I/O, daily weights  Atrial Fibrillation  Eliquis  Digoxin  Plan is for TEE/cardioversion Monday or Tuesday  Cramping  Lytes ok Will continue robaxin   Elevated Creatinine Not reaching criteria for AKI Will monitor closely Baseline appears to be ~1.16 Cirrhosis Unclear what outpatient follow up he has for this Repeat US  without significant ascites  Hypertension Amlodipine  on hold Appears she's not taking losartan   Asthma No si/sx exacerbation    DVT prophylaxis: eliquis Code Status: full Family Communication: none Disposition:   Status is: Inpatient Remains inpatient appropriate because: need for continued cardiology management   Consultants:  Cardiology GI  Procedures:  R/LHC Conclusion: Moderate to severe two vessel disease Compensated nonischemic cardiomyopathy with severe biventricular failure   Recommendation: Continue GDMT for HFrEF Consider advanced heart failure team consult Ordered to start Eliquis tomorrow morning  Echo IMPRESSIONS     1. Left ventricular ejection fraction, by estimation, is 25 to 30%. The  left ventricle has severely decreased function. The left ventricle  demonstrates regional wall motion abnormalities (see scoring  diagram/findings for description). The left  ventricular internal cavity size was moderately  dilated. There is mild  left ventricular hypertrophy. Left ventricular diastolic parameters are  indeterminate. LAD akinesis with no LV  thrombus.   2. Right ventricular systolic function is normal. The right ventricular  size is mildly enlarged. There is normal pulmonary artery systolic  pressure.   3. Left atrial size was mildly dilated.   4. Right atrial size was moderately dilated.   5. The mitral valve is normal in structure. Moderate to severe mitral  valve regurgitation. No evidence of mitral stenosis.   6. Tricuspid valve regurgitation is moderate to severe.   7. The aortic valve is tricuspid. Aortic valve regurgitation is trivial.  No aortic stenosis is present.   8. Severely dilated pulmonary artery.   9. The inferior vena cava is normal in size with greater than 50%  respiratory variability, suggesting right atrial pressure of 3 mmHg.   Comparison(s): No prior Echocardiogram.   Conclusion(s)/Recommendation(s): Reaching out to Dr. [name].   EGD Normal esophagus, small amount of food residue in stomach, normal stomach, normal duodenal buld, first portion of duodenum, second portion of the duodenum, third portion of duodenum and 4th portion of duodenum   Antimicrobials:  Anti-infectives (From admission, onward)    None       Subjective: Notes cramping today  Objective: Vitals:   07/28/24 0334 07/28/24 0818 07/28/24 0933 07/28/24 1138  BP: (!) 158/105 (!) 157/98  (!) 152/120  Pulse:  (!) 104 (!) 107 100  Resp: 19 (!) 39  18  Temp: 98.2 F (36.8 C) 98.7 F (37.1 C)  97.8 F (36.6 C)  TempSrc: Oral Oral  Oral  SpO2: 97% 100%  100%  Weight: 86.2 kg     Height:        Intake/Output Summary (Last 24 hours) at 07/28/2024 1301 Last data filed at 07/28/2024 1100 Gross per 24 hour  Intake 493 ml  Output 2800 ml  Net -2307 ml   Filed Weights   07/26/24 1419 07/27/24 0500 07/28/24 0334  Weight: 85.7 kg 86.2 kg 86.2 kg    Examination:  General: No acute distress. Cardiovascular: afib Lungs: unlabored Abdomen: distended Neurological: Alert and oriented 3. Moves all extremities 4 with equal  strength. Cranial nerves II through XII grossly intact. Skin: Warm and dry. No rashes or lesions. Extremities: No clubbing or cyanosis. No edema.  Data Reviewed: I have personally reviewed following labs and imaging studies  CBC: Recent Labs  Lab 07/21/24 1424 07/22/24 1844 07/24/24 0219 07/26/24 0631 07/26/24 1601 07/26/24 1602 07/26/24 1618 07/27/24 0233 07/28/24 0342  WBC 8.2 8.5 8.0 8.2  --   --   --  7.1 8.9  NEUTROABS 5.0 5.3 4.7  --   --   --   --   --   --   HGB 12.6* 14.0 13.5 13.4 14.3 14.3 13.6  13.9 13.8 13.6  HCT 38.5* 41.9 40.1 40.1 42.0 42.0 40.0  41.0 40.7 41.3  MCV 85.2 83.3 83.2 84.8  --   --   --  86.0 86.0  PLT 187 214 199 190  --   --   --  197 193    Basic Metabolic Panel: Recent Labs  Lab 07/21/24 1424 07/22/24 1844 07/23/24 0221 07/24/24 0219 07/25/24 0222 07/26/24 0631 07/26/24 1601 07/26/24 1602 07/26/24 1618 07/27/24 0233 07/28/24 0342 07/28/24 0642  NA 139 137   < > 135 137 137 140 142 128*  136 140 137  --   K 4.3 4.3   < > 3.4* 3.6  4.0 4.2 4.3 3.5  4.1 3.8 4.9  --   CL 111 106   < > 103 101 104  --   --   --  105 106  --   CO2 21* 21*   < > 24 24 22   --   --   --  24 24  --   GLUCOSE 89 102*   < > 120* 141* 113*  --   --   --  133* 110*  --   BUN 23 18   < > 20 19 13   --   --   --  16 21  --   CREATININE 1.24 1.20   < > 1.35* 1.39* 1.31*  --   --   --  1.39* 1.31*  --   CALCIUM 9.0 8.7*   < > 8.7* 8.8* 8.1*  --   --   --  8.3* 8.6*  --   MG 2.0 1.9  --   --   --   --   --   --   --   --   --  2.0   < > = values in this interval not displayed.    GFR: Estimated Creatinine Clearance: 64.4 mL/min (Pamla Pangle) (by C-G formula based on SCr of 1.31 mg/dL (H)).  Liver Function Tests: Recent Labs  Lab 07/21/24 1424 07/22/24 1844  AST 44* 46*  ALT 76* 78*  ALKPHOS 76 69  BILITOT 0.7 1.3*  PROT 5.9* 6.0*  ALBUMIN 3.6 3.2*    CBG: No results for input(s): GLUCAP in the last 168 hours.   No results found for this or any previous  visit (from the past 240 hours).       Radiology Studies: US  ASCITES (ABDOMEN LIMITED) Result Date: 07/28/2024 EXAM: LIMITED ABDOMINAL ULTRASOUND FOR ASCITES EVALUATION TECHNIQUE: Limited real-time sonography of all 4 quadrants of the abdomen was performed for evaluation of ascites. CLINICAL HISTORY: Ascites, [id number]. COMPARISON: US  Abdomen Limited 07/22/2024. FINDINGS: RIGHT UPPER QUADRANT: No ascites seen. LEFT UPPER QUADRANT: No ascites seen. RIGHT LOWER QUADRANT: No ascites seen. LEFT LOWER QUADRANT: No ascites seen. OTHER: Limited visualization of the rest of the abdomen demonstrates no acute abnormality. IMPRESSION: 1. No significant ascites. Electronically signed by: Francis Quam MD 07/28/2024 05:34 AM EST RP Workstation: [id number]   DG CHEST PORT 1 VIEW Result Date: 07/27/2024 EXAM: 1 VIEW(S) XRAY OF THE CHEST 07/27/2024 09:27:00 AM COMPARISON: 07/21/2024 CLINICAL HISTORY: Status post PICC central line placement FINDINGS: LINES, TUBES AND DEVICES: Right upper extremity PICC in place with tip in superior vena cava. LUNGS AND PLEURA: No focal pulmonary opacity. No pulmonary edema. No pleural effusion. No pneumothorax. HEART AND MEDIASTINUM: Stable cardiomegaly. BONES AND SOFT TISSUES: No acute osseous abnormality. IMPRESSION: 1. Right upper extremity PICC in place with tip in superior vena cava. 2. Stable cardiomegaly. Electronically signed by: Evalene Coho MD 07/27/2024 09:40 AM EDT RP Workstation: [id number]   US  EKG SITE RITE Result Date: 07/26/2024 If Site Rite image not attached, placement could not be confirmed due to current cardiac rhythm.  CARDIAC CATHETERIZATION Result Date: 07/26/2024 Images from the original result were not included. Coronary angiography 07/26/2024: LM: Normal LAD: Prox 40% disease Ramus: No significant disease Lcx: Codominant. No significant disease RCA: Small to medium caliber, codominant.          Prox 80%, mid 70% calcific stenoses LVEDP 9 mmHg Right  heart catheterization 07/26/2024: RA: 11 mmHg RV: 40/3 mmHg PA: 37/23 mmHg, mPAP 28 mmHg PCW: 16  mmHg AO sats: 93% PA sats: 46% CO: 3.1 L/min CI: 1.5 L/min/m2 PAPi 1. 2 Conclusion: Moderate to severe two vessel disease Compensated nonischemic cardiomyopathy with severe biventricular failure Recommendation: Continue GDMT for HFrEF Consider advanced heart failure team consult Ordered to start Eliquis tomorrow morning Manish JINNY Lawrence, MD        Scheduled Meds:  apixaban  5 mg Oral BID   Chlorhexidine  Gluconate Cloth  6 each Topical Daily   cholecalciferol  2,000 Units Oral Daily   digoxin  0.125 mg Oral Daily   empagliflozin  10 mg Oral Daily   famotidine  20 mg Oral BID   fluticasone  furoate-vilanterol  1 puff Inhalation Daily   gabapentin   300 mg Oral TID   hydrOXYzine  25 mg Oral QHS   loratadine  10 mg Oral Daily   losartan   12.5 mg Oral Daily   methocarbamol   500 mg Oral BID   pantoprazole  40 mg Oral Daily   sodium chloride  flush  10-40 mL Intracatheter Q12H   sodium chloride  flush  3 mL Intravenous Q12H   sodium chloride  flush  3 mL Intravenous Q12H   Continuous Infusions:     LOS: 6 days    Time spent: over 30 min     Meliton Monte, MD [facility]   To contact the attending provider between 7A-7P or the covering provider during after hours 7P-7A, please log into the web site [email or web address] and access using universal [place] password for that web site. If you do not have the password, please call the hospital operator.  07/28/2024, 1:01 PM

## 2024-07-28 NOTE — Plan of Care (Signed)

## 2024-07-28 NOTE — H&P (View-Only) (Signed)
 Patient ID: Benjamin Abbott, male   DOB: June 01, 1961, 63 y.o.   MRN: [id number]     Advanced Heart Failure Rounding Note  Cardiologist: Darryle ONEIDA Decent, MD  Chief Complaint: CHF Subjective:    Patient remains in atrial fibrillation, rate 90s at rest.   I/Os net negative 1584 yesterday.  CVP not hooked up. Co-ox 71%. SBP 150s.  Creatinine stable 1.3.   No dyspnea, reports cramping.    Objective:   Weight Range: 86.2 kg Body mass index is 28.06 kg/m.   Vital Signs:   Temp:  [97.8 F (36.6 C)-98.7 F (37.1 C)] 97.8 F (36.6 C) (11/02 1138) Pulse Rate:  [92-107] 100 (11/02 1138) Resp:  [17-39] 18 (11/02 1138) BP: (119-158)/(93-120) 152/120 (11/02 1138) SpO2:  [96 %-100 %] 100 % (11/02 1138) Weight:  [86.2 kg] 86.2 kg (11/02 0334) Last BM Date : 07/27/24  Weight change: Filed Weights   07/26/24 1419 07/27/24 0500 07/28/24 0334  Weight: 85.7 kg 86.2 kg 86.2 kg    Intake/Output:   Intake/Output Summary (Last 24 hours) at 07/28/2024 1354 Last data filed at 07/28/2024 1100 Gross per 24 hour  Intake 493 ml  Output 2800 ml  Net -2307 ml      Physical Exam    General:  Well appearing. No resp difficulty HEENT: Normal Neck: Supple. JVP 10-12 cm. Carotids 2+ bilat; no bruits. No lymphadenopathy or thyromegaly appreciated. Cor: PMI lateral. Irregular rate & rhythm. No rubs, gallops or murmurs. Lungs: Clear Abdomen: Soft, nontender, nondistended. No hepatosplenomegaly. No bruits or masses. Good bowel sounds. Extremities: No cyanosis, clubbing, rash, edema Neuro: Alert & orientedx3, cranial nerves grossly intact. moves all 4 extremities w/o difficulty. Affect pleasant   Telemetry   Atrial fibrillation rate 90s (personally reviewed)  Labs    CBC Recent Labs    07/27/24 0233 07/28/24 0342  WBC 7.1 8.9  HGB 13.8 13.6  HCT 40.7 41.3  MCV 86.0 86.0  PLT 197 193   Basic Metabolic Panel Recent Labs    88/98/74 0233 07/28/24 0342 07/28/24 0642  NA 140 137  --    K 3.8 4.9  --   CL 105 106  --   CO2 24 24  --   GLUCOSE 133* 110*  --   BUN 16 21  --   CREATININE 1.39* 1.31*  --   CALCIUM 8.3* 8.6*  --   MG  --   --  2.0   Liver Function Tests No results for input(s): AST, ALT, ALKPHOS, BILITOT, PROT, ALBUMIN in the last 72 hours. No results for input(s): LIPASE, AMYLASE in the last 72 hours. Cardiac Enzymes No results for input(s): CKTOTAL, CKMB, CKMBINDEX, TROPONINI in the last 72 hours.  BNP: BNP (last 3 results) Recent Labs    07/22/24 1844  BNP 1,405.0*    ProBNP (last 3 results) Recent Labs    07/21/24 1424  PROBNP 2,285.0*     D-Dimer No results for input(s): DDIMER in the last 72 hours. Hemoglobin A1C No results for input(s): HGBA1C in the last 72 hours. Fasting Lipid Panel Recent Labs    07/26/24 0631  CHOL 125  HDL 46  LDLCALC 64  TRIG 77  CHOLHDL 2.7   Thyroid  Function Tests Recent Labs    07/26/24 0631  TSH 2.548    Other results:   Imaging    US  ASCITES (ABDOMEN LIMITED) Result Date: 07/28/2024 EXAM: LIMITED ABDOMINAL ULTRASOUND FOR ASCITES EVALUATION TECHNIQUE: Limited real-time sonography of all 4 quadrants of the abdomen  was performed for evaluation of ascites. CLINICAL HISTORY: Ascites, [id number]. COMPARISON: US  Abdomen Limited 07/22/2024. FINDINGS: RIGHT UPPER QUADRANT: No ascites seen. LEFT UPPER QUADRANT: No ascites seen. RIGHT LOWER QUADRANT: No ascites seen. LEFT LOWER QUADRANT: No ascites seen. OTHER: Limited visualization of the rest of the abdomen demonstrates no acute abnormality. IMPRESSION: 1. No significant ascites. Electronically signed by: Francis Quam MD 07/28/2024 05:34 AM EST RP Workstation: [id number]     Medications:     Scheduled Medications:  apixaban  5 mg Oral BID   Chlorhexidine  Gluconate Cloth  6 each Topical Daily   cholecalciferol  2,000 Units Oral Daily   digoxin  0.125 mg Oral Daily   empagliflozin  10 mg Oral Daily   famotidine  20  mg Oral BID   fluticasone  furoate-vilanterol  1 puff Inhalation Daily   furosemide  40 mg Intravenous BID   gabapentin   300 mg Oral TID   hydrOXYzine  25 mg Oral QHS   loratadine  10 mg Oral Daily   methocarbamol   500 mg Oral BID   pantoprazole  40 mg Oral Daily   sacubitril-valsartan  1 tablet Oral BID   sodium chloride  flush  10-40 mL Intracatheter Q12H   sodium chloride  flush  3 mL Intravenous Q12H   sodium chloride  flush  3 mL Intravenous Q12H   spironolactone  12.5 mg Oral Daily    Infusions:   PRN Medications: acetaminophen , albuterol , fluticasone , HYDROmorphone  (DILAUDID ) injection, montelukast, naLOXone (NARCAN)  injection, ondansetron , simethicone, sodium chloride  flush, sodium chloride  flush, sodium chloride  flush    Assessment/Plan   Acute systolic heart failure: Moderate, single-vessel CAD in the RCA, predominant nonischemic cardiomyopathy.  Suspect etiology related to tachyarrhythmia (AF), though heavy alcohol use could also be considered. While his index was severely reduced, his endorgan function is relatively stable, Co-ox now 71% off inotropes.  No urgent need for inotropes, will instead work on titrating medical therapy and tentatively plan for TEE/DCCV Monday.  He is volume overloaded on exam today though not hooked up for CVP.  - Set up CVP.  - Continue digoxin 0.125 daily.  - Stop losartan , start Entresto 24/26 bid.  - Continue Jardiance 10 daily . - Add spironolactone 12.5 daily.  - Lasix 40 mg IV bid today and follow response.  - No indication for inotropes with stable endorgan function - Hold on BB   Persistent atrial fibrillation: HR 90s.  - Holding on amiodarone with liver disease - TEE/DCCV tomorrow.  - Consider eventual AF ablation.    Cirrhosis: - Suspect due to alcohol use - Synthetic function appears intact, GI following   Substance use: - Prior THC, cocaine - Heavy alcohol use  HTN - Transitioning to [facility] today and adding  spironolactone.   Length of Stay: 6  Ezra Shuck, MD  07/28/2024, 1:54 PM  Advanced Heart Failure Team Pager [phone] (M-F; 7a - 5p)  Please contact [facility] for night-coverage after hours (5p -7a ) and weekends on [email or web address]

## 2024-07-28 NOTE — Progress Notes (Signed)
 Patient ID: Benjamin Abbott, male   DOB: June 01, 1961, 63 y.o.   MRN: [id number]     Advanced Heart Failure Rounding Note  Cardiologist: Darryle ONEIDA Decent, MD  Chief Complaint: CHF Subjective:    Patient remains in atrial fibrillation, rate 90s at rest.   I/Os net negative 1584 yesterday.  CVP not hooked up. Co-ox 71%. SBP 150s.  Creatinine stable 1.3.   No dyspnea, reports cramping.    Objective:   Weight Range: 86.2 kg Body mass index is 28.06 kg/m.   Vital Signs:   Temp:  [97.8 F (36.6 C)-98.7 F (37.1 C)] 97.8 F (36.6 C) (11/02 1138) Pulse Rate:  [92-107] 100 (11/02 1138) Resp:  [17-39] 18 (11/02 1138) BP: (119-158)/(93-120) 152/120 (11/02 1138) SpO2:  [96 %-100 %] 100 % (11/02 1138) Weight:  [86.2 kg] 86.2 kg (11/02 0334) Last BM Date : 07/27/24  Weight change: Filed Weights   07/26/24 1419 07/27/24 0500 07/28/24 0334  Weight: 85.7 kg 86.2 kg 86.2 kg    Intake/Output:   Intake/Output Summary (Last 24 hours) at 07/28/2024 1354 Last data filed at 07/28/2024 1100 Gross per 24 hour  Intake 493 ml  Output 2800 ml  Net -2307 ml      Physical Exam    General:  Well appearing. No resp difficulty HEENT: Normal Neck: Supple. JVP 10-12 cm. Carotids 2+ bilat; no bruits. No lymphadenopathy or thyromegaly appreciated. Cor: PMI lateral. Irregular rate & rhythm. No rubs, gallops or murmurs. Lungs: Clear Abdomen: Soft, nontender, nondistended. No hepatosplenomegaly. No bruits or masses. Good bowel sounds. Extremities: No cyanosis, clubbing, rash, edema Neuro: Alert & orientedx3, cranial nerves grossly intact. moves all 4 extremities w/o difficulty. Affect pleasant   Telemetry   Atrial fibrillation rate 90s (personally reviewed)  Labs    CBC Recent Labs    07/27/24 0233 07/28/24 0342  WBC 7.1 8.9  HGB 13.8 13.6  HCT 40.7 41.3  MCV 86.0 86.0  PLT 197 193   Basic Metabolic Panel Recent Labs    88/98/74 0233 07/28/24 0342 07/28/24 0642  NA 140 137  --    K 3.8 4.9  --   CL 105 106  --   CO2 24 24  --   GLUCOSE 133* 110*  --   BUN 16 21  --   CREATININE 1.39* 1.31*  --   CALCIUM 8.3* 8.6*  --   MG  --   --  2.0   Liver Function Tests No results for input(s): AST, ALT, ALKPHOS, BILITOT, PROT, ALBUMIN in the last 72 hours. No results for input(s): LIPASE, AMYLASE in the last 72 hours. Cardiac Enzymes No results for input(s): CKTOTAL, CKMB, CKMBINDEX, TROPONINI in the last 72 hours.  BNP: BNP (last 3 results) Recent Labs    07/22/24 1844  BNP 1,405.0*    ProBNP (last 3 results) Recent Labs    07/21/24 1424  PROBNP 2,285.0*     D-Dimer No results for input(s): DDIMER in the last 72 hours. Hemoglobin A1C No results for input(s): HGBA1C in the last 72 hours. Fasting Lipid Panel Recent Labs    07/26/24 0631  CHOL 125  HDL 46  LDLCALC 64  TRIG 77  CHOLHDL 2.7   Thyroid  Function Tests Recent Labs    07/26/24 0631  TSH 2.548    Other results:   Imaging    US  ASCITES (ABDOMEN LIMITED) Result Date: 07/28/2024 EXAM: LIMITED ABDOMINAL ULTRASOUND FOR ASCITES EVALUATION TECHNIQUE: Limited real-time sonography of all 4 quadrants of the abdomen  was performed for evaluation of ascites. CLINICAL HISTORY: Ascites, [id number]. COMPARISON: US  Abdomen Limited 07/22/2024. FINDINGS: RIGHT UPPER QUADRANT: No ascites seen. LEFT UPPER QUADRANT: No ascites seen. RIGHT LOWER QUADRANT: No ascites seen. LEFT LOWER QUADRANT: No ascites seen. OTHER: Limited visualization of the rest of the abdomen demonstrates no acute abnormality. IMPRESSION: 1. No significant ascites. Electronically signed by: Francis Quam MD 07/28/2024 05:34 AM EST RP Workstation: [id number]     Medications:     Scheduled Medications:  apixaban  5 mg Oral BID   Chlorhexidine  Gluconate Cloth  6 each Topical Daily   cholecalciferol  2,000 Units Oral Daily   digoxin  0.125 mg Oral Daily   empagliflozin  10 mg Oral Daily   famotidine  20  mg Oral BID   fluticasone  furoate-vilanterol  1 puff Inhalation Daily   furosemide  40 mg Intravenous BID   gabapentin   300 mg Oral TID   hydrOXYzine  25 mg Oral QHS   loratadine  10 mg Oral Daily   methocarbamol   500 mg Oral BID   pantoprazole  40 mg Oral Daily   sacubitril-valsartan  1 tablet Oral BID   sodium chloride  flush  10-40 mL Intracatheter Q12H   sodium chloride  flush  3 mL Intravenous Q12H   sodium chloride  flush  3 mL Intravenous Q12H   spironolactone  12.5 mg Oral Daily    Infusions:   PRN Medications: acetaminophen , albuterol , fluticasone , HYDROmorphone  (DILAUDID ) injection, montelukast, naLOXone (NARCAN)  injection, ondansetron , simethicone, sodium chloride  flush, sodium chloride  flush, sodium chloride  flush    Assessment/Plan   Acute systolic heart failure: Moderate, single-vessel CAD in the RCA, predominant nonischemic cardiomyopathy.  Suspect etiology related to tachyarrhythmia (AF), though heavy alcohol use could also be considered. While his index was severely reduced, his endorgan function is relatively stable, Co-ox now 71% off inotropes.  No urgent need for inotropes, will instead work on titrating medical therapy and tentatively plan for TEE/DCCV Monday.  He is volume overloaded on exam today though not hooked up for CVP.  - Set up CVP.  - Continue digoxin 0.125 daily.  - Stop losartan , start Entresto 24/26 bid.  - Continue Jardiance 10 daily . - Add spironolactone 12.5 daily.  - Lasix 40 mg IV bid today and follow response.  - No indication for inotropes with stable endorgan function - Hold on BB   Persistent atrial fibrillation: HR 90s.  - Holding on amiodarone with liver disease - TEE/DCCV tomorrow.  - Consider eventual AF ablation.    Cirrhosis: - Suspect due to alcohol use - Synthetic function appears intact, GI following   Substance use: - Prior THC, cocaine - Heavy alcohol use  HTN - Transitioning to [facility] today and adding  spironolactone.   Length of Stay: 6  Ezra Shuck, MD  07/28/2024, 1:54 PM  Advanced Heart Failure Team Pager [phone] (M-F; 7a - 5p)  Please contact [facility] for night-coverage after hours (5p -7a ) and weekends on [email or web address]

## 2024-07-29 ENCOUNTER — Inpatient Hospital Stay (HOSPITAL_COMMUNITY): Admitting: Anesthesiology

## 2024-07-29 ENCOUNTER — Inpatient Hospital Stay (HOSPITAL_COMMUNITY)

## 2024-07-29 ENCOUNTER — Encounter (HOSPITAL_COMMUNITY): Admission: EM | Disposition: A | Payer: Self-pay | Source: Home / Self Care | Attending: Internal Medicine

## 2024-07-29 DIAGNOSIS — I4891 Unspecified atrial fibrillation: Secondary | ICD-10-CM

## 2024-07-29 DIAGNOSIS — I110 Hypertensive heart disease with heart failure: Secondary | ICD-10-CM

## 2024-07-29 DIAGNOSIS — I340 Nonrheumatic mitral (valve) insufficiency: Secondary | ICD-10-CM | POA: Diagnosis not present

## 2024-07-29 DIAGNOSIS — I5021 Acute systolic (congestive) heart failure: Secondary | ICD-10-CM | POA: Diagnosis not present

## 2024-07-29 DIAGNOSIS — I351 Nonrheumatic aortic (valve) insufficiency: Secondary | ICD-10-CM | POA: Diagnosis not present

## 2024-07-29 DIAGNOSIS — I509 Heart failure, unspecified: Secondary | ICD-10-CM | POA: Diagnosis not present

## 2024-07-29 DIAGNOSIS — I5043 Acute on chronic combined systolic (congestive) and diastolic (congestive) heart failure: Secondary | ICD-10-CM

## 2024-07-29 HISTORY — PX: TRANSESOPHAGEAL ECHOCARDIOGRAM (CATH LAB): EP1270

## 2024-07-29 HISTORY — PX: CARDIOVERSION: EP1203

## 2024-07-29 LAB — BASIC METABOLIC PANEL WITH GFR
Anion gap: 10 (ref 5–15)
BUN: 19 mg/dL (ref 8–23)
CO2: 23 mmol/L (ref 22–32)
Calcium: 8 mg/dL — ABNORMAL LOW (ref 8.9–10.3)
Chloride: 104 mmol/L (ref 98–111)
Creatinine, Ser: 1.32 mg/dL — ABNORMAL HIGH (ref 0.61–1.24)
GFR, Estimated: >60 mL/min (ref >60)
Glucose, Bld: 126 mg/dL — ABNORMAL HIGH (ref 70–99)
Potassium: 4.1 mmol/L (ref 3.5–5.1)
Sodium: 137 mmol/L (ref 135–145)

## 2024-07-29 LAB — COOXEMETRY PANEL
Carboxyhemoglobin: 1.4 % (ref 0.5–1.5)
Carboxyhemoglobin: 0.8 % (ref 0.5–1.5)
Methemoglobin: <0.7 % (ref 0.0–1.5)
Methemoglobin: <0.7 % (ref 0.0–1.5)
O2 Saturation: 52.5 %
O2 Saturation: 42.9 %
Total hemoglobin: 14.5 g/dL (ref 12.0–16.0)
Total hemoglobin: 15.2 g/dL (ref 12.0–16.0)

## 2024-07-29 LAB — CBC
HCT: 41.7 % (ref 39.0–52.0)
Hemoglobin: 13.7 g/dL (ref 13.0–17.0)
MCH: 28.2 pg (ref 26.0–34.0)
MCHC: 32.9 g/dL (ref 30.0–36.0)
MCV: 86 fL (ref 80.0–100.0)
Platelets: 170 K/uL (ref 150–400)
RBC: 4.85 MIL/uL (ref 4.22–5.81)
RDW: 15.8 % — ABNORMAL HIGH (ref 11.5–15.5)
WBC: 7.6 K/uL (ref 4.0–10.5)
nRBC: 0 % (ref 0.0–0.2)

## 2024-07-29 LAB — LIPOPROTEIN A (LPA): Lipoprotein (a): 148.3 nmol/L — ABNORMAL HIGH (ref <75.0)

## 2024-07-29 LAB — LACTIC ACID, PLASMA: Lactic Acid, Venous: 1 mmol/L (ref 0.5–1.9)

## 2024-07-29 MED ORDER — PROPOFOL 500 MG/50ML IV EMUL
INTRAVENOUS | Status: DC | PRN
Start: 2024-07-29 — End: 2024-07-29
  Administered 2024-07-29: 50 ug/kg/min via INTRAVENOUS

## 2024-07-29 MED ORDER — AMIODARONE LOAD VIA INFUSION
150.0000 mg | Freq: Once | INTRAVENOUS | Status: AC
  Administered 2024-07-29: 150 mg via INTRAVENOUS
  Filled 2024-07-29: qty 83.34

## 2024-07-29 MED ORDER — KETAMINE HCL 50 MG/5ML IJ SOSY
PREFILLED_SYRINGE | INTRAMUSCULAR | Status: AC
Start: 2024-07-29 — End: 2024-07-29
  Filled 2024-07-29: qty 5

## 2024-07-29 MED ORDER — PHENOL 1.4 % MT LIQD
1.0000 | OROMUCOSAL | Status: DC | PRN
  Administered 2024-07-29: 1 via OROMUCOSAL
  Filled 2024-07-29: qty 177

## 2024-07-29 MED ORDER — MENTHOL 3 MG MT LOZG
1.0000 | LOZENGE | OROMUCOSAL | Status: DC | PRN
  Administered 2024-07-29 – 2024-07-30 (×3): 3 mg via ORAL
  Filled 2024-07-29: qty 9

## 2024-07-29 MED ORDER — SPIRONOLACTONE 25 MG PO TABS
25.0000 mg | ORAL_TABLET | Freq: Every day | ORAL | Status: DC
  Administered 2024-07-29 – 2024-08-01 (×4): 25 mg via ORAL
  Filled 2024-07-29 (×4): qty 1

## 2024-07-29 MED ORDER — PROPOFOL 10 MG/ML IV BOLUS
INTRAVENOUS | Status: DC | PRN
  Administered 2024-07-29: 30 mg via INTRAVENOUS
  Administered 2024-07-29: 10 mg via INTRAVENOUS
  Administered 2024-07-29: 20 mg via INTRAVENOUS

## 2024-07-29 MED ORDER — PHENYLEPHRINE HCL-NACL 20-0.9 MG/250ML-% IV SOLN
INTRAVENOUS | Status: AC
Start: 2024-07-29 — End: 2024-07-29
  Filled 2024-07-29: qty 250

## 2024-07-29 MED ORDER — AMIODARONE HCL IN DEXTROSE 360-4.14 MG/200ML-% IV SOLN
30.0000 mg/h | INTRAVENOUS | Status: DC
  Administered 2024-07-30 – 2024-08-01 (×5): 30 mg/h via INTRAVENOUS
  Filled 2024-07-29 (×7): qty 200

## 2024-07-29 MED ORDER — AMIODARONE HCL IN DEXTROSE 360-4.14 MG/200ML-% IV SOLN
60.0000 mg/h | INTRAVENOUS | Status: AC
  Administered 2024-07-29 (×2): 60 mg/h via INTRAVENOUS

## 2024-07-29 MED ORDER — KETAMINE HCL 10 MG/ML IJ SOLN
INTRAMUSCULAR | Status: DC | PRN
  Administered 2024-07-29: 20 mg via INTRAVENOUS
  Administered 2024-07-29: 10 mg via INTRAVENOUS

## 2024-07-29 NOTE — Anesthesia Preprocedure Evaluation (Addendum)
 Anesthesia Evaluation  Patient identified by MRN, date of birth, ID band Patient awake    Reviewed: Allergy & Precautions, NPO status , Patient's Chart, lab work & pertinent test results  History of Anesthesia Complications Negative for: history of anesthetic complications  Airway Mallampati: II  TM Distance: >3 FB Neck ROM: Full    Dental  (+) Dental Advisory Given, Chipped   Pulmonary asthma , sleep apnea and Continuous Positive Airway Pressure Ventilation , COPD,  COPD inhaler, Current Smoker and Patient abstained from smoking.   Pulmonary exam normal        Cardiovascular hypertension, Pt. on medications +CHF  Normal cardiovascular exam+ dysrhythmias Atrial Fibrillation + Valvular Problems/Murmurs MR    '25 Cath - Moderate to severe two vessel disease. 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 The left ventricular internal cavity size was moderately dilated. There is mild left ventricular hypertrophy. LAD akinesis with no LV thrombus. The right ventricular size is mildly enlarged. Left atrial size was mildly dilated. Right atrial size was moderately dilated. Moderate to severe mitral valve regurgitation. Tricuspid valve regurgitation is moderate to severe. Aortic valve regurgitation is trivial. Severely dilated pulmonary artery.     Neuro/Psych  Neuromuscular disease  negative psych ROS   GI/Hepatic ,GERD  Controlled,,(+) Cirrhosis     substance abuse  marijuana use Colon cancer    Endo/Other  negative endocrine ROS    Renal/GU Renal InsufficiencyRenal disease     Musculoskeletal  (+) Arthritis ,    Abdominal   Peds  Hematology negative hematology ROS (+)   Anesthesia Other Findings Chronic pain   Reproductive/Obstetrics                              Anesthesia Physical Anesthesia Plan  ASA: 4  Anesthesia Plan: MAC and  General   Post-op Pain Management: Minimal or no pain anticipated   Induction:   PONV Risk Score and Plan: 1 and Propofol  infusion and Treatment may vary due to age or medical condition  Airway Management Planned: Nasal Cannula and Natural Airway  Additional Equipment: None  Intra-op Plan:   Post-operative Plan:   Informed Consent: I have reviewed the patients History and Physical, chart, labs and discussed the procedure including the risks, benefits and alternatives for the proposed anesthesia with the patient or authorized representative who has indicated his/her understanding and acceptance.       Plan Discussed with: CRNA and Anesthesiologist  Anesthesia Plan Comments: (May begin procedure as MAC with conversion to GA as indicated by procedure )         Anesthesia Quick Evaluation

## 2024-07-29 NOTE — Progress Notes (Signed)
 Heart Failure Navigator Progress Note  Assessed for Heart & Vascular TOC clinic readiness.  Patient does not meet criteria due to Advanced Heart Failure team was consulted. No HF TOC. His original HF TOC appointment was cancelled. .   Navigator will sign off at this time.   Stephane Haddock, BSN, Scientist, Clinical (histocompatibility And Immunogenetics) Only

## 2024-07-29 NOTE — CV Procedure (Signed)
   TRANSESOPHAGEAL ECHOCARDIOGRAM GUIDED DIRECT CURRENT CARDIOVERSION  NAME:  Benjamin Abbott   MRN: [id number] DOB:  [birth year]   ADMIT DATE: 07/21/2024  INDICATIONS:  AF  PROCEDURE:   Informed consent was obtained prior to the procedure. The risks, benefits and alternatives for the procedure were discussed and the patient comprehended these risks.  Risks include, but are not limited to, cough, sore throat, vomiting, nausea, somnolence, esophageal and stomach trauma or perforation, bleeding, low blood pressure, aspiration, pneumonia, infection, trauma to the teeth and death.    After a procedural time-out, the oropharynx was anesthetized and the patient was sedated by the anesthesia service. The transesophageal probe was inserted in the esophagus and stomach without difficulty and multiple views were obtained.   FINDINGS:  LEFT VENTRICLE: EF = < 20% Golbal HK  RIGHT VENTRICLE: Severe HK   LEFT ATRIUM: Markedly dilated  LEFT ATRIAL APPENDAGE: No clot  RIGHT ATRIUM: Normal   AORTIC VALVE:  [place].  Mild AI  MITRAL VALVE:    Mild MR  TRICUSPID VALVE: Trivial TR  PULMONIC VALVE: Trivial PR  INTERATRIAL SEPTUM: No PFO/ASD  PERICARDIUM: trivial effusion  DESCENDING AORTA: not well visualized   CARDIOVERSION:     Indications:  Atrial Fibrillation  Procedure Details:  Once the TEE was complete, the patient had the defibrillator pads placed in the anterior and posterior position. Once an appropriate level of sedation was achieved, the patient received a single biphasic, synchronized 200J shock with prompt conversion to sinus rhythm for several beats but went back to AF. We gave subsequent shocks at 225 and 250 with same result. No apparent complications.   Toribio Fuel, MD  1:26 PM

## 2024-07-29 NOTE — Plan of Care (Signed)

## 2024-07-29 NOTE — Progress Notes (Signed)
 PROGRESS NOTE    Benjamin Abbott  FMW:[id number] DOB: [birth year] DOA: 07/21/2024 PCP: Leontine Cramp, NP  Chief Complaint  Patient presents with   Abdominal Pain    Brief Narrative:   63 yr old man who presented to the ED at [facility] for complaints of abdominal bloated and swelling in h is lower extremities as well as shortness of breath. In the ED the patient was found to have Twila Rappa BNP of 2000, Merrick Maggio new finding of atrial fibrillation with rate control, and pulmonary vascular congestion. He also was found to have Avrey Flanagin protuberant abdomen with swelling in his legs bilaterally.   CXR demonstrated pulmonary vascular congestion, CT of the abdomen and pelvis demonstrated ascites and hepatic cirrhosis.   The patient was transferred to [facility] for admission. Echocatdiogram was obtained that demonstrated severe systolic CHF with EF of 25%.    Cardiology was consulted. They recommended Doil Kamara right and Styles Fambro left heart catheterization, but want the patient to undergo EGD first to rule out any potential issue with anticoagulation or antithrombotics should they become necessary.   The patient underwent EGD with Dr. Rosalie. There was no bleeding noted. He then underwent catheterization (right and left heat) with Dr. Barbaraann. It demonstrated cardiomyopathy out of proportion to CAD. 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  There is also evidence of right heart failure.    Plan is for PICC placement and milrinone infusion. The beta blocker was discontinued. He was placed on digoxin for rate control. Amiodarone could also be added for atrial fibrillation.    The patient will require further optimization of GDMT. Plan is for Yamilka Lopiccolo TEE/cardioversion on Monday or Tuesday.  Assessment & Plan:   Principal Problem:   Congestive heart failure (HCC) Active Problems:   Asthma   Tobacco dependence   Essential hypertension   Volume overload   Paroxysmal atrial fibrillation (HCC)   Hepatic cirrhosis (HCC)    Elevated troponin   Acute systolic heart failure (HCC)   Atrial fibrillation (HCC)   Demand ischemia (HCC)   Acute on chronic combined systolic and diastolic CHF (congestive heart failure) (HCC)  HFrEF Cirrhosis Volume Overload  CXR today without focal pulm opacity US  ascites with small volume simple ascites in r upper and right lower quadrants S/p R/LHC with moderate to severe 2 vessel disease, nonischemic cardiomyopathy with severe biventricular failure.   Now s/p PICC placement.  HF team recommending entresto, jardiance, spironolactone- diuretics per carbds Strict I/O, daily weights  Atrial Fibrillation  Eliquis  Digoxin  S/p TEE/cardioversion 11/3, but converted back into afib  Cramping  Lytes ok Will continue robaxin  No complaint today  Elevated Creatinine Not reaching criteria for AKI Will monitor closely Baseline appears to be ~1.16  Cirrhosis Unclear what outpatient follow up he has for this Repeat US  without significant ascites  Hypertension Amlodipine  on hold Appears she's not taking losartan   Asthma No si/sx exacerbation    DVT prophylaxis: eliquis Code Status: full Family Communication: none Disposition:   Status is: Inpatient Remains inpatient appropriate because: need for continued cardiology management   Consultants:  Cardiology GI  Procedures:  R/LHC Conclusion: Moderate to severe two vessel disease Compensated nonischemic cardiomyopathy with severe biventricular failure   Recommendation: Continue GDMT for HFrEF Consider advanced heart failure team consult Ordered to start Eliquis tomorrow morning  Echo IMPRESSIONS     1. Left ventricular ejection fraction, by estimation, is 25 to 30%. The  left ventricle has severely decreased function. The left ventricle  demonstrates regional  wall motion abnormalities (see scoring  diagram/findings for description). The left  ventricular internal cavity size was moderately dilated. There is  mild  left ventricular hypertrophy. Left ventricular diastolic parameters are  indeterminate. LAD akinesis with no LV thrombus.   2. Right ventricular systolic function is normal. The right ventricular  size is mildly enlarged. There is normal pulmonary artery systolic  pressure.   3. Left atrial size was mildly dilated.   4. Right atrial size was moderately dilated.   5. The mitral valve is normal in structure. Moderate to severe mitral  valve regurgitation. No evidence of mitral stenosis.   6. Tricuspid valve regurgitation is moderate to severe.   7. The aortic valve is tricuspid. Aortic valve regurgitation is trivial.  No aortic stenosis is present.   8. Severely dilated pulmonary artery.   9. The inferior vena cava is normal in size with greater than 50%  respiratory variability, suggesting right atrial pressure of 3 mmHg.   Comparison(s): No prior Echocardiogram.   Conclusion(s)/Recommendation(s): Reaching out to Dr. [name].   EGD Normal esophagus, small amount of food residue in stomach, normal stomach, normal duodenal buld, first portion of duodenum, second portion of the duodenum, third portion of duodenum and 4th portion of duodenum   Antimicrobials:  Anti-infectives (From admission, onward)    None       Subjective: C/o CP after cardioversion  Objective: Vitals:   07/29/24 1340 07/29/24 1345 07/29/24 1355 07/29/24 1441  BP: (!) 133/90 (!) 136/113 (!) 128/92 129/83  Pulse: 73 65 67   Resp: 14 19 16 16   Temp:    (!) 97.3 F (36.3 C)  TempSrc:    Oral  SpO2: 94% 95% 94% 97%  Weight:      Height:        Intake/Output Summary (Last 24 hours) at 07/29/2024 1647 Last data filed at 07/29/2024 1400 Gross per 24 hour  Intake 373.8 ml  Output 4050 ml  Net -3676.2 ml   Filed Weights   07/27/24 0500 07/28/24 0334 07/29/24 0635  Weight: 86.2 kg 86.2 kg 83.5 kg    Examination:  General: No acute distress. Cardiovascular: irregularly irregular, tachy. Lungs:  unlabored Abdomen: distended. Neurological: Alert and oriented 3. Moves all extremities 4. Cranial nerves II through XII grossly intact.. Extremities: No clubbing or cyanosis. No edema.   Data Reviewed: I have personally reviewed following labs and imaging studies  CBC: Recent Labs  Lab 07/22/24 1844 07/24/24 0219 07/26/24 0631 07/26/24 1601 07/26/24 1602 07/26/24 1618 07/27/24 0233 07/28/24 0342 07/29/24 0435  WBC 8.5 8.0 8.2  --   --   --  7.1 8.9 7.6  NEUTROABS 5.3 4.7  --   --   --   --   --   --   --   HGB 14.0 13.5 13.4   < > 14.3 13.6  13.9 13.8 13.6 13.7  HCT 41.9 40.1 40.1   < > 42.0 40.0  41.0 40.7 41.3 41.7  MCV 83.3 83.2 84.8  --   --   --  86.0 86.0 86.0  PLT 214 199 190  --   --   --  197 193 170   < > = values in this interval not displayed.    Basic Metabolic Panel: Recent Labs  Lab 07/22/24 1844 07/23/24 0221 07/25/24 0222 07/26/24 0631 07/26/24 1601 07/26/24 1602 07/26/24 1618 07/27/24 0233 07/28/24 0342 07/28/24 0642 07/29/24 0435  NA 137   < > 137 137   < >  142 128*  136 140 137  --  137  K 4.3   < > 3.6 4.0   < > 4.3 3.5  4.1 3.8 4.9  --  4.1  CL 106   < > 101 104  --   --   --  105 106  --  104  CO2 21*   < > 24 22  --   --   --  24 24  --  23  GLUCOSE 102*   < > 141* 113*  --   --   --  133* 110*  --  126*  BUN 18   < > 19 13  --   --   --  16 21  --  19  CREATININE 1.20   < > 1.39* 1.31*  --   --   --  1.39* 1.31*  --  1.32*  CALCIUM 8.7*   < > 8.8* 8.1*  --   --   --  8.3* 8.6*  --  8.0*  MG 1.9  --   --   --   --   --   --   --   --  2.0  --    < > = values in this interval not displayed.    GFR: Estimated Creatinine Clearance: 58.8 mL/min (Jake Goodson) (by C-G formula based on SCr of 1.32 mg/dL (H)).  Liver Function Tests: Recent Labs  Lab 07/22/24 1844  AST 46*  ALT 78*  ALKPHOS 69  BILITOT 1.3*  PROT 6.0*  ALBUMIN 3.2*    CBG: No results for input(s): GLUCAP in the last 168 hours.   No results found for this or any  previous visit (from the past 240 hours).       Radiology Studies: EP STUDY Result Date: 07/29/2024 See surgical note for result.  US  ASCITES (ABDOMEN LIMITED) Result Date: 07/28/2024 EXAM: LIMITED ABDOMINAL ULTRASOUND FOR ASCITES EVALUATION TECHNIQUE: Limited real-time sonography of all 4 quadrants of the abdomen was performed for evaluation of ascites. CLINICAL HISTORY: Ascites, [id number]. COMPARISON: US  Abdomen Limited 07/22/2024. FINDINGS: RIGHT UPPER QUADRANT: No ascites seen. LEFT UPPER QUADRANT: No ascites seen. RIGHT LOWER QUADRANT: No ascites seen. LEFT LOWER QUADRANT: No ascites seen. OTHER: Limited visualization of the rest of the abdomen demonstrates no acute abnormality. IMPRESSION: 1. No significant ascites. Electronically signed by: Francis Quam MD 07/28/2024 05:34 AM EST RP Workstation: [id number]        Scheduled Meds:  amiodarone  150 mg Intravenous Once   apixaban  5 mg Oral BID   Chlorhexidine  Gluconate Cloth  6 each Topical Daily   cholecalciferol  2,000 Units Oral Daily   digoxin  0.125 mg Oral Daily   empagliflozin  10 mg Oral Daily   famotidine  20 mg Oral BID   fluticasone  furoate-vilanterol  1 puff Inhalation Daily   furosemide  40 mg Intravenous BID   gabapentin   300 mg Oral TID   hydrOXYzine  25 mg Oral QHS   loratadine  10 mg Oral Daily   methocarbamol   500 mg Oral BID   pantoprazole  40 mg Oral Daily   sacubitril-valsartan  1 tablet Oral BID   sodium chloride  flush  10-40 mL Intracatheter Q12H   sodium chloride  flush  3 mL Intravenous Q12H   sodium chloride  flush  3 mL Intravenous Q12H   spironolactone  25 mg Oral Daily   Continuous Infusions:  amiodarone     Followed by   amiodarone  LOS: 7 days    Time spent: over 30 min     Meliton Monte, MD [facility]   To contact the attending provider between 7A-7P or the covering provider during after hours 7P-7A, please log into the web site [email or web address] and access using  universal  password for that web site. If you do not have the password, please call the hospital operator.  07/29/2024, 4:47 PM

## 2024-07-29 NOTE — Progress Notes (Signed)
 [facility] night cross cover note:   Patient complaining of some soreness in his throat following TEE performed earlier today.  I subsequently ordered prn Chloraseptic throat spray as well as prn Cepacol throat lozenges for him.     Eva Pore, DO Hospitalist

## 2024-07-29 NOTE — Progress Notes (Addendum)
 Patient ID: Benjamin Abbott, male   DOB: 04-26-1961, 63 y.o.   MRN: [id number]     Advanced Heart Failure Rounding Note  Cardiologist: Darryle ONEIDA Decent, MD  Chief Complaint: CHF Subjective:    Co-ox 53%, eFick 3.1/1.5. CVP 11. Net -2.6L. Weight down 6 lbs. sCr stable at 1.3 Remains in AF 80-110s.   Sitting up in bed. No SOB or CP. No GI upset.   Objective:    Weight Range: 83.5 kg Body mass index is 27.17 kg/m.   Vital Signs:   Temp:  [97.8 F (36.6 C)-98.7 F (37.1 C)] 97.8 F (36.6 C) (11/03 0430) Pulse Rate:  [53-107] 61 (11/02 1940) Resp:  [10-39] 10 (11/03 0430) BP: (107-157)/(67-120) 126/88 (11/03 0430) SpO2:  [98 %-100 %] 99 % (11/03 0430) Weight:  [83.5 kg] 83.5 kg (11/03 0635) Last BM Date : 07/27/24  Weight change: Filed Weights   07/27/24 0500 07/28/24 0334 07/29/24 0635  Weight: 86.2 kg 86.2 kg 83.5 kg   Intake/Output:  Intake/Output Summary (Last 24 hours) at 07/29/2024 0716 Last data filed at 07/29/2024 0716 Gross per 24 hour  Intake 1016.43 ml  Output 4300 ml  Net -3283.57 ml    Physical Exam    General: Well appearing. No distress on RA Cardiac: JVP ~8cm. S1 and S2 present. No murmurs  Abdomen: Soft, non-tender, non-distended.  Extremities: Warm and dry.  No peripheral edema.  Neuro: Alert and oriented x3. Affect pleasant. Moves all extremities without difficulty.  Telemetry   AF 80-110s (personally reviewed)  Labs    CBC Recent Labs    07/28/24 0342 07/29/24 0435  WBC 8.9 7.6  HGB 13.6 13.7  HCT 41.3 41.7  MCV 86.0 86.0  PLT 193 170   Basic Metabolic Panel Recent Labs    88/97/74 0342 07/28/24 0642 07/29/24 0435  NA 137  --  137  K 4.9  --  4.1  CL 106  --  104  CO2 24  --  23  GLUCOSE 110*  --  126*  BUN 21  --  19  CREATININE 1.31*  --  1.32*  CALCIUM 8.6*  --  8.0*  MG  --  2.0  --    BNP (last 3 results) Recent Labs    07/22/24 1844  BNP 1,405.0*   ProBNP (last 3 results) Recent Labs    07/21/24 1424   PROBNP 2,285.0*   Medications:    Scheduled Medications:  apixaban  5 mg Oral BID   Chlorhexidine  Gluconate Cloth  6 each Topical Daily   cholecalciferol  2,000 Units Oral Daily   digoxin  0.125 mg Oral Daily   empagliflozin  10 mg Oral Daily   famotidine  20 mg Oral BID   fluticasone  furoate-vilanterol  1 puff Inhalation Daily   furosemide  40 mg Intravenous BID   gabapentin   300 mg Oral TID   hydrOXYzine  25 mg Oral QHS   loratadine  10 mg Oral Daily   methocarbamol   500 mg Oral BID   pantoprazole  40 mg Oral Daily   sacubitril-valsartan  1 tablet Oral BID   sodium chloride  flush  10-40 mL Intracatheter Q12H   sodium chloride  flush  3 mL Intravenous Q12H   sodium chloride  flush  3 mL Intravenous Q12H   spironolactone  12.5 mg Oral Daily    Infusions:  sodium chloride  20 mL/hr at 07/29/24 0631    PRN Medications: acetaminophen , albuterol , fluticasone , HYDROmorphone  (DILAUDID ) injection, montelukast, naLOXone (NARCAN)  injection, ondansetron ,  simethicone, sodium chloride  flush, sodium chloride  flush, sodium chloride  flush  Assessment/Plan   Acute systolic heart failure: Moderate, single-vessel CAD in the RCA, predominant nonischemic cardiomyopathy.  Suspect etiology related to tachyarrhythmia (AF), though heavy alcohol use could also be considered. While his index was severely reduced, his endorgan function is relatively stable, Co-ox 71% off inotropes. At 53% today.  No urgent need for inotropes, will instead work on titrating medical therapy and tentatively plan for TEE/DCCV Monday.  He is volume overloaded on exam today though not hooked up for CVP.  - Coox low this morning 53%, eFick CO/CI 3.1/1.5. As above endo organ perfusion stable. Repeat co-ox and LA. - CVP 11-12. Continue Lasix 40 mg bid today. - Continue digoxin 0.125 daily.  - Continue Entresto 24/26 bid. SBP improved, remains HTN by DBP. - Continue Jardiance 10 daily . - Increase spironolactone to 25 daily.  -  No indication for inotropes with stable endorgan function, plan as above. - Hold on BB   Persistent atrial fibrillation: HR 90s.  - Holding on amiodarone with liver disease - TEE/DCCV today - Consider eventual AF ablation.    Cirrhosis: - Suspect due to alcohol use - Synthetic function appears intact, GI following   Substance use: - Prior THC, cocaine - Heavy alcohol use  HTN - GDMT as above  Length of Stay: 7  Benjamin Lee, NP  07/29/2024, 7:16 AM  Advanced Heart Failure Team Pager [phone] (M-F; 7a - 5p)  Please contact [facility] for night-coverage after hours (5p -7a ) and weekends on [email or web address]  Patient seen and examined with the above-signed Advanced Practice Provider and/or Housestaff. I personally reviewed laboratory data, imaging studies and relevant notes. I independently examined the patient and formulated the important aspects of the plan. I have edited the note to reflect any of my changes or salient points. I have personally discussed the plan with the patient and/or family.  Remains very tenuous with marginal co-ox. AF remains fast. Failed DC-CV x 3 today   TEE EF < 20 RV severe HK  General:  Sitting up in bed. No resp difficulty HEENT: normal Neck: supple. JVP to ear  Cor: Irregualr tachy  Lungs: clear Abdomen: soft, nontender, nondistended.Good bowel sounds. Extremities: no cyanosis, clubbing, rash, tr-1+ edema cool Neuro: alert & orientedx3, cranial nerves grossly intact. moves all 4 extremities w/o difficulty. Affect pleasant   Remains very tenuous. Suspect tachy CM. We need to restore sinus rhythm will start IV amio. Very low threshold to add milrinone.   Will need repeat DC-CV once amio loaded.   Benjamin Fuel, MD  4:15 PM

## 2024-07-29 NOTE — Plan of Care (Signed)
   Problem: Activity: Goal: Risk for activity intolerance will decrease Outcome: Progressing

## 2024-07-29 NOTE — Transfer of Care (Signed)
 Immediate Anesthesia Transfer of Care Note  Patient: Benjamin Abbott  Procedure(s) Performed: TRANSESOPHAGEAL ECHOCARDIOGRAM CARDIOVERSION  Patient Location: PACU and Cath Lab  Anesthesia Type:MAC  Level of Consciousness: sedated  Airway & Oxygen Therapy: Patient Spontanous Breathing and Patient connected to nasal cannula oxygen  Post-op Assessment: Report given to RN and Post -op Vital signs reviewed and stable  Post vital signs: Reviewed and stable  Last Vitals:  Vitals Value Taken Time  BP    Temp    Pulse    Resp    SpO2      Last Pain:  Vitals:   07/29/24 1000  TempSrc:   PainSc: 0-No pain      Patients Stated Pain Goal: 0 (07/28/24 0818)  Complications: No notable events documented.

## 2024-07-29 NOTE — Anesthesia Postprocedure Evaluation (Signed)
 Anesthesia Post Note  Patient: Benjamin Abbott  Procedure(s) Performed: TRANSESOPHAGEAL ECHOCARDIOGRAM CARDIOVERSION     Patient location during evaluation: PACU Anesthesia Type: MAC Level of consciousness: awake and alert Pain management: pain level controlled Vital Signs Assessment: post-procedure vital signs reviewed and stable Respiratory status: spontaneous breathing, nonlabored ventilation and respiratory function stable Cardiovascular status: stable, blood pressure returned to baseline and tachycardic Anesthetic complications: no   No notable events documented.  Last Vitals:  Vitals:   07/29/24 1355 07/29/24 1441  BP: (!) 128/92 129/83  Pulse: 67   Resp: 16 16  Temp:  (!) 36.3 C  SpO2: 94% 97%    Last Pain:  Vitals:   07/29/24 1441  TempSrc: Oral  PainSc:                  Debby FORBES Like

## 2024-07-29 NOTE — Interval H&P Note (Signed)
 History and Physical Interval Note:  07/29/2024 1:02 PM  Benjamin Abbott  has presented today for surgery, with the diagnosis of afib.  The various methods of treatment have been discussed with the patient and family. After consideration of risks, benefits and other options for treatment, the patient has consented to  Procedure(s): TRANSESOPHAGEAL ECHOCARDIOGRAM (N/A) CARDIOVERSION (N/A) as a surgical intervention.  The patient's history has been reviewed, patient examined, no change in status, stable for surgery.  I have reviewed the patient's chart and labs.  Questions were answered to the patient's satisfaction.     Donaven Criswell

## 2024-07-30 ENCOUNTER — Encounter (HOSPITAL_COMMUNITY): Payer: Self-pay | Admitting: Internal Medicine

## 2024-07-30 DIAGNOSIS — I4891 Unspecified atrial fibrillation: Secondary | ICD-10-CM | POA: Diagnosis not present

## 2024-07-30 DIAGNOSIS — I5021 Acute systolic (congestive) heart failure: Secondary | ICD-10-CM | POA: Diagnosis not present

## 2024-07-30 LAB — BASIC METABOLIC PANEL WITH GFR
Anion gap: 9 (ref 5–15)
BUN: 20 mg/dL (ref 8–23)
CO2: 26 mmol/L (ref 22–32)
Calcium: 8.1 mg/dL — ABNORMAL LOW (ref 8.9–10.3)
Chloride: 103 mmol/L (ref 98–111)
Creatinine, Ser: 1.46 mg/dL — ABNORMAL HIGH (ref 0.61–1.24)
GFR, Estimated: 54 mL/min — ABNORMAL LOW (ref >60)
Glucose, Bld: 131 mg/dL — ABNORMAL HIGH (ref 70–99)
Potassium: 4.1 mmol/L (ref 3.5–5.1)
Sodium: 138 mmol/L (ref 135–145)

## 2024-07-30 LAB — COOXEMETRY PANEL
Carboxyhemoglobin: 1.4 % (ref 0.5–1.5)
Methemoglobin: <0.7 % (ref 0.0–1.5)
O2 Saturation: 56.5 %
Total hemoglobin: 15.6 g/dL (ref 12.0–16.0)

## 2024-07-30 LAB — CBC
HCT: 45.5 % (ref 39.0–52.0)
Hemoglobin: 15.1 g/dL (ref 13.0–17.0)
MCH: 28.2 pg (ref 26.0–34.0)
MCHC: 33.2 g/dL (ref 30.0–36.0)
MCV: 85 fL (ref 80.0–100.0)
Platelets: 193 K/uL (ref 150–400)
RBC: 5.35 MIL/uL (ref 4.22–5.81)
RDW: 15.9 % — ABNORMAL HIGH (ref 11.5–15.5)
WBC: 10 K/uL (ref 4.0–10.5)
nRBC: 0 % (ref 0.0–0.2)

## 2024-07-30 MED ORDER — SODIUM CHLORIDE 0.9 % IV SOLN: INTRAVENOUS | Status: AC

## 2024-07-30 NOTE — Progress Notes (Signed)
 Mobility Specialist Progress Note:    07/30/24 1359  Mobility  Activity Ambulated with assistance  Level of Assistance Independent after set-up  Assistive Device  (IV Pole)  Distance Ambulated (ft) 500 ft  Activity Response Tolerated well  Mobility Referral Yes  Mobility visit 1 Mobility  Mobility Specialist Start Time (ACUTE ONLY) 1359  Mobility Specialist Stop Time (ACUTE ONLY) 1414  Mobility Specialist Time Calculation (min) (ACUTE ONLY) 15 min   Received pt laying in bed agreeable to session. No c/o any symptoms. Pt moving and ambulating well. Returned pt to room w/ all needs met.   Venetia Keel Mobility Specialist Please Neurosurgeon or Rehab Office at [phone]

## 2024-07-30 NOTE — Plan of Care (Signed)
  Problem: Education: Goal: Knowledge of General Education information will improve Description: Including pain rating scale, medication(s)/side effects and non-pharmacologic comfort measures Outcome: Progressing   Problem: Clinical Measurements: Goal: Diagnostic test results will improve Outcome: Progressing   Problem: Clinical Measurements: Goal: Cardiovascular complication will be avoided Outcome: Progressing   

## 2024-07-30 NOTE — Progress Notes (Addendum)
 Patient ID: Benjamin Abbott, male   DOB: 25-Jul-1961, 63 y.o.   MRN: [id number]     Advanced Heart Failure Rounding Note  Cardiologist: Darryle ONEIDA Decent, MD  Chief Complaint: CHF Subjective:   11/3 S/P TEE & DC-CV x3. Failed to convert. Continue amio load.   Denies SOB.   Objective:    Weight Range: 83.9 kg Body mass index is 27.3 kg/m.   Vital Signs:   Temp:  [97.3 F (36.3 C)-98 F (36.7 C)] 98 F (36.7 C) (11/04 0410) Pulse Rate:  [42-94] 59 (11/04 0410) Resp:  [14-24] 19 (11/04 0410) BP: (100-141)/(57-113) 100/57 (11/04 0410) SpO2:  [94 %-98 %] 98 % (11/04 0410) Weight:  [83.9 kg] 83.9 kg (11/04 0423) Last BM Date : 07/29/24 (per pt)  Weight change: Filed Weights   07/28/24 0334 07/29/24 0635 07/30/24 0423  Weight: 86.2 kg 83.5 kg 83.9 kg   Intake/Output:  Intake/Output Summary (Last 24 hours) at 07/30/2024 0707 Last data filed at 07/30/2024 0259 Gross per 24 hour  Intake 1216.43 ml  Output 3100 ml  Net -1883.57 ml   CVP 1-2  Physical Exam  General:   No resp difficulty Neck: no JVD.  Cor: Irregular rate & rhythm.  Lungs: clear Abdomen: soft, nontender, nondistended.  Extremities: no  edema. RUE PICC  Neuro: alert & oriented x3  Telemetry   A fib 80-90s   Labs    CBC Recent Labs    07/29/24 0435 07/30/24 0622  WBC 7.6 10.0  HGB 13.7 15.1  HCT 41.7 45.5  MCV 86.0 85.0  PLT 170 193   Basic Metabolic Panel Recent Labs    88/97/74 0642 07/29/24 0435 07/30/24 0622  NA  --  137 138  K  --  4.1 4.1  CL  --  104 103  CO2  --  23 26  GLUCOSE  --  126* 131*  BUN  --  19 20  CREATININE  --  1.32* 1.46*  CALCIUM  --  8.0* 8.1*  MG 2.0  --   --    BNP (last 3 results) Recent Labs    07/22/24 1844  BNP 1,405.0*   ProBNP (last 3 results) Recent Labs    07/21/24 1424  PROBNP 2,285.0*   Medications:    Scheduled Medications:  apixaban  5 mg Oral BID   Chlorhexidine  Gluconate Cloth  6 each Topical Daily   cholecalciferol  2,000 Units  Oral Daily   digoxin  0.125 mg Oral Daily   empagliflozin  10 mg Oral Daily   famotidine  20 mg Oral BID   fluticasone  furoate-vilanterol  1 puff Inhalation Daily   furosemide  40 mg Intravenous BID   gabapentin   300 mg Oral TID   hydrOXYzine  25 mg Oral QHS   loratadine  10 mg Oral Daily   methocarbamol   500 mg Oral BID   pantoprazole  40 mg Oral Daily   sacubitril-valsartan  1 tablet Oral BID   sodium chloride  flush  10-40 mL Intracatheter Q12H   sodium chloride  flush  3 mL Intravenous Q12H   sodium chloride  flush  3 mL Intravenous Q12H   spironolactone  25 mg Oral Daily    Infusions:  amiodarone 30 mg/hr (07/30/24 0407)    PRN Medications: acetaminophen , albuterol , fluticasone , HYDROmorphone  (DILAUDID ) injection, menthol, montelukast, naLOXone (NARCAN)  injection, ondansetron , phenol, simethicone, sodium chloride  flush, sodium chloride  flush, sodium chloride  flush  Assessment/Plan   Acute systolic heart failure: Moderate, single-vessel CAD in the RCA,  predominant nonischemic cardiomyopathy.  Suspect etiology related to tachyarrhythmia (AF), though heavy alcohol use could also be considered. While his index was severely reduced, his endorgan function is relatively stable - Coox 57%. . CVP down 1-2. Appears euvolemic.  - Stop IV lasix.  - Continue digoxin 0.125 daily.  - Continue Entresto 24/26 bid.  - Continue Jardiance 10 daily . - Continue spironolactone to 25 daily.  - Hold on BB - Creatinine up a little. As above stopped lasix.    Persistent atrial fibrillation: HR 90s.  - Holding on amiodarone with liver disease -11/3  TEE/DCCV failed x3 oday. Back on Amiodarone. Reattempt cardioversion once loaded> 11/6  - Consider eventual AF ablation.  - Suspect due to alcohol use - Synthetic function appears intact, GI following   Substance use: - Prior THC, cocaine - Heavy alcohol use  HTN - GDMT as above - Pressure improved.   Lives alone. Does not drive. Will TOC CM  to consult. Substance Abuse counseling.   Length of Stay: 8  Amy Clegg, NP  07/30/2024, 7:07 AM  Advanced Heart Failure Team Pager [phone] (M-F; 7a - 5p)  Please contact [facility] for night-coverage after hours (5p -7a ) and weekends on [email or web address]  Patient seen and examined with the above-signed Advanced Practice Provider and/or Housestaff. I personally reviewed laboratory data, imaging studies and relevant notes. I independently examined the patient and formulated the important aspects of the plan. I have edited the note to reflect any of my changes or salient points. I have personally discussed the plan with the patient and/or family.  Failed DC-CV x 3 yesterday. Amio started. Still in AF but rates improved  Co-ox 57% CVP 2   General:  Sitting up in bed. No resp difficulty HEENT: normal Neck: supple. no JVD.  Cor: Irregular rate & rhythm. No rubs, gallops or murmurs. Lungs: clear Abdomen: soft, nontender, nondistended.Good bowel sounds. Extremities: no cyanosis, clubbing, rash, edema Neuro: alert & orientedx3, cranial nerves grossly intact. moves all 4 extremities w/o difficulty. Affect pleasant  Volume status much improved. Can hold lasix. AF rate improved. Continue amio. Plan DC-CV Thursday.   Continue GDMT.   Toribio Fuel, MD  2:34 PM

## 2024-07-30 NOTE — Hospital Course (Signed)
 SABRA

## 2024-07-30 NOTE — Progress Notes (Signed)
 PROGRESS NOTE    Benjamin Abbott  FMW:[id number] DOB: 1960-10-02 DOA: 07/21/2024 PCP: Leontine Cramp, NP  Chief Complaint  Patient presents with   Abdominal Pain    Brief Narrative:   63 yr old man who presented to the ED at [facility] for complaints of abdominal bloated and swelling in h is lower extremities as well as shortness of breath. In the ED the patient was found to have Benjamin Abbott BNP of 2000, [name] new finding of atrial fibrillation with rate control, and pulmonary vascular congestion. He also was found to have Benjamin Abbott protuberant abdomen with swelling in his legs bilaterally.   CXR demonstrated pulmonary vascular congestion, CT of the abdomen and pelvis demonstrated ascites and hepatic cirrhosis.   The patient was transferred to [facility] for admission. Echocatdiogram was obtained that demonstrated severe systolic CHF with EF of 25%.    Cardiology was consulted. They recommended Alondria Mousseau right and Kelon Easom left heart catheterization.  He underwent EGD first on 10/31 to r/o potential issues with anticoagulation or antithrombotics.    The patient underwent EGD with Dr. Rosalie. There was no bleeding noted. He then underwent catheterization (right and left heat) with Dr. Barbaraann. It demonstrated cardiomyopathy out of proportion to CAD. 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  There is also evidence of right heart failure.    Now s/p PICC placement.  HF team is managing his volume/diuretics.    He's s/p TEE cardioversion 11/3, but back in afib.  Plan is to repeat cardioversion after amiodarone load on 11/6.     Assessment & Plan:   Principal Problem:   Congestive heart failure (HCC) Active Problems:   Asthma   Tobacco dependence   Essential hypertension   Volume overload   Paroxysmal atrial fibrillation (HCC)   Hepatic cirrhosis (HCC)   Elevated troponin   Acute systolic heart failure (HCC)   Atrial fibrillation (HCC)   Demand ischemia (HCC)   Acute on chronic combined systolic and  diastolic CHF (congestive heart failure) (HCC)  HFrEF Cirrhosis Volume Overload  CXR today without focal pulm opacity US  ascites with small volume simple ascites in r upper and right lower quadrants S/p R/LHC with moderate to severe 2 vessel disease, nonischemic cardiomyopathy with severe biventricular failure.   Now s/p PICC placement.  HF team recommending entresto, jardiance, spironolactone- diuretics on hold per cards.  Beta blocker on hold. Strict I/O, daily weights  Atrial Fibrillation  Eliquis  Digoxin  S/p TEE/cardioversion 11/3, but converted back into afib Amiodarone per cards - plan for repeat cardioversion 11/6  Cramping  Lytes ok Will continue robaxin  No complaint today  Acute Kidney Injury Mild, 1.4 Trend, additional w/u if necessary Baseline appears to be ~1.16  Cirrhosis Unclear what outpatient follow up he has for this - will need outpatient follow up Repeat US  without significant ascites  Hypertension Amlodipine  on hold Appears he's not taking losartan   Asthma No si/sx exacerbation    DVT prophylaxis: eliquis Code Status: full Family Communication: none Disposition:   Status is: Inpatient Remains inpatient appropriate because: need for continued cardiology management   Consultants:  Cardiology GI  Procedures:  TEE cardioversion Procedure Details:   Once the TEE was complete, the patient had the defibrillator pads placed in the anterior and posterior position. Once an appropriate level of sedation was achieved, the patient received [name] single biphasic, synchronized 200J shock with prompt conversion to sinus rhythm for several beats but went back to AF. We gave subsequent shocks at 225 and  250 with same result. No apparent complications.    R/LHC Conclusion: Moderate to severe two vessel disease Compensated nonischemic cardiomyopathy with severe biventricular failure   Recommendation: Continue GDMT for HFrEF Consider advanced heart  failure team consult Ordered to start Eliquis tomorrow morning  Echo IMPRESSIONS     1. Left ventricular ejection fraction, by estimation, is 25 to 30%. The  left ventricle has severely decreased function. The left ventricle  demonstrates regional wall motion abnormalities (see scoring  diagram/findings for description). The left  ventricular internal cavity size was moderately dilated. There is mild  left ventricular hypertrophy. Left ventricular diastolic parameters are  indeterminate. LAD akinesis with no LV thrombus.   2. Right ventricular systolic function is normal. The right ventricular  size is mildly enlarged. There is normal pulmonary artery systolic  pressure.   3. Left atrial size was mildly dilated.   4. Right atrial size was moderately dilated.   5. The mitral valve is normal in structure. Moderate to severe mitral  valve regurgitation. No evidence of mitral stenosis.   6. Tricuspid valve regurgitation is moderate to severe.   7. The aortic valve is tricuspid. Aortic valve regurgitation is trivial.  No aortic stenosis is present.   8. Severely dilated pulmonary artery.   9. The inferior vena cava is normal in size with greater than 50%  respiratory variability, suggesting right atrial pressure of 3 mmHg.   Comparison(s): No prior Echocardiogram.   Conclusion(s)/Recommendation(s): Reaching out to Dr. [name].   EGD Normal esophagus, small amount of food residue in stomach, normal stomach, normal duodenal buld, first portion of duodenum, second portion of the duodenum, third portion of duodenum and 4th portion of duodenum   Antimicrobials:  Anti-infectives (From admission, onward)    None       Subjective: C/o sore throat and cP related to procedure  Objective: Vitals:   07/30/24 0913 07/30/24 1119 07/30/24 1600 07/30/24 1929  BP: (!) 121/93 121/85 116/75 106/79  Pulse: (!) 106 88 94 85  Resp: 19 18 19 19   Temp: 98.3 F (36.8 C) 98.5 F (36.9 C) 98.5  F (36.9 C) 98.7 F (37.1 C)  TempSrc: Oral Oral Oral Oral  SpO2: 100% 100% 100% 98%  Weight:      Height:        Intake/Output Summary (Last 24 hours) at 07/30/2024 1944 Last data filed at 07/30/2024 1700 Gross per 24 hour  Intake 1180 ml  Output 2425 ml  Net -1245 ml   Filed Weights   07/28/24 0334 07/29/24 0635 07/30/24 0423  Weight: 86.2 kg 83.5 kg 83.9 kg    Examination:  General: No acute distress. Cardiovascular: tachy, irregularly irregular Lungs: unlabored Abdomen: distended, nontender Neurological: Alert and oriented 3. Moves all extremities 4 with equal strength. Cranial nerves II through XII grossly intact. Extremities: No clubbing or cyanosis. No edema.  Data Reviewed: I have personally reviewed following labs and imaging studies  CBC: Recent Labs  Lab 07/24/24 0219 07/26/24 0631 07/26/24 1601 07/26/24 1618 07/27/24 0233 07/28/24 0342 07/29/24 0435 07/30/24 0622  WBC 8.0 8.2  --   --  7.1 8.9 7.6 10.0  NEUTROABS 4.7  --   --   --   --   --   --   --   HGB 13.5 13.4   < > 13.6  13.9 13.8 13.6 13.7 15.1  HCT 40.1 40.1   < > 40.0  41.0 40.7 41.3 41.7 45.5  MCV 83.2 84.8  --   --  86.0 86.0 86.0 85.0  PLT 199 190  --   --  197 193 170 193   < > = values in this interval not displayed.    Basic Metabolic Panel: Recent Labs  Lab 07/26/24 0631 07/26/24 1601 07/26/24 1618 07/27/24 0233 07/28/24 0342 07/28/24 0642 07/29/24 0435 07/30/24 0622  NA 137   < > 128*  136 140 137  --  137 138  K 4.0   < > 3.5  4.1 3.8 4.9  --  4.1 4.1  CL 104  --   --  105 106  --  104 103  CO2 22  --   --  24 24  --  23 26  GLUCOSE 113*  --   --  133* 110*  --  126* 131*  BUN 13  --   --  16 21  --  19 20  CREATININE 1.31*  --   --  1.39* 1.31*  --  1.32* 1.46*  CALCIUM 8.1*  --   --  8.3* 8.6*  --  8.0* 8.1*  MG  --   --   --   --   --  2.0  --   --    < > = values in this interval not displayed.    GFR: Estimated Creatinine Clearance: 53.1 mL/min (Samyia Motter) (by  C-G formula based on SCr of 1.46 mg/dL (H)).  Liver Function Tests: No results for input(s): AST, ALT, ALKPHOS, BILITOT, PROT, ALBUMIN in the last 168 hours.   CBG: No results for input(s): GLUCAP in the last 168 hours.   No results found for this or any previous visit (from the past 240 hours).       Radiology Studies: EP STUDY Result Date: 07/29/2024 See surgical note for result.       Scheduled Meds:  apixaban  5 mg Oral BID   Chlorhexidine  Gluconate Cloth  6 each Topical Daily   cholecalciferol  2,000 Units Oral Daily   digoxin  0.125 mg Oral Daily   empagliflozin  10 mg Oral Daily   famotidine  20 mg Oral BID   fluticasone  furoate-vilanterol  1 puff Inhalation Daily   gabapentin   300 mg Oral TID   hydrOXYzine  25 mg Oral QHS   loratadine  10 mg Oral Daily   methocarbamol   500 mg Oral BID   pantoprazole  40 mg Oral Daily   sacubitril-valsartan  1 tablet Oral BID   sodium chloride  flush  10-40 mL Intracatheter Q12H   sodium chloride  flush  3 mL Intravenous Q12H   sodium chloride  flush  3 mL Intravenous Q12H   spironolactone  25 mg Oral Daily   Continuous Infusions:  sodium chloride      amiodarone 30 mg/hr (07/30/24 1534)      LOS: 8 days    Time spent: over 30 min     Meliton Monte, MD [facility]   To contact the attending provider between 7A-7P or the covering provider during after hours 7P-7A, please log into the web site [email or web address] and access using universal [place] password for that web site. If you do not have the password, please call the hospital operator.  07/30/2024, 7:44 PM

## 2024-07-30 NOTE — TOC Progression Note (Signed)
 Transition of Care [facility]) - Progression Note    Patient Details  Name: Benjamin Abbott MRN: [id number] Date of Birth: 07-02-1961  Transition of [facility]) CM/SW Contact  [name] Phone Number: [phone] 07/30/2024, 3:07 PM  Clinical Narrative:   2:09 PM- HF CSW attempted to meet with patient at bedside. Patient was not in the room at the time. CSW was coming to address SA consult. Will follow up with patient at a more appropriate time.   HF CSW/CM will continue to follow and monitor for dc readiness.                       Expected Discharge Plan and Services                                               Social Drivers of Health (SDOH) Interventions SDOH Screenings   Food Insecurity: No Food Insecurity (07/22/2024)  Housing: Unknown (07/24/2024)  Transportation Needs: No Transportation Needs (07/22/2024)  Utilities: Not At Risk (07/22/2024)  Alcohol Screen: Low Risk  (07/24/2024)  Financial Resource Strain: Low Risk  (07/24/2024)  Social Connections: Unknown (11/08/2022)   Received from [facility]  Tobacco Use: Medium Risk (07/26/2024)    Readmission Risk Interventions    07/25/2024    2:07 PM  Readmission Risk Prevention Plan  Medication Screening Complete  Transportation Screening Complete

## 2024-07-30 NOTE — Plan of Care (Signed)

## 2024-07-31 DIAGNOSIS — R188 Other ascites: Secondary | ICD-10-CM | POA: Diagnosis not present

## 2024-07-31 DIAGNOSIS — K7460 Unspecified cirrhosis of liver: Secondary | ICD-10-CM

## 2024-07-31 DIAGNOSIS — I4891 Unspecified atrial fibrillation: Secondary | ICD-10-CM | POA: Diagnosis not present

## 2024-07-31 DIAGNOSIS — I5023 Acute on chronic systolic (congestive) heart failure: Secondary | ICD-10-CM | POA: Diagnosis not present

## 2024-07-31 DIAGNOSIS — I5021 Acute systolic (congestive) heart failure: Secondary | ICD-10-CM | POA: Diagnosis not present

## 2024-07-31 LAB — BASIC METABOLIC PANEL WITH GFR
Anion gap: 11 (ref 5–15)
BUN: 14 mg/dL (ref 8–23)
CO2: 23 mmol/L (ref 22–32)
Calcium: 8 mg/dL — ABNORMAL LOW (ref 8.9–10.3)
Chloride: 103 mmol/L (ref 98–111)
Creatinine, Ser: 1.33 mg/dL — ABNORMAL HIGH (ref 0.61–1.24)
GFR, Estimated: >60 mL/min (ref >60)
Glucose, Bld: 149 mg/dL — ABNORMAL HIGH (ref 70–99)
Potassium: 4.3 mmol/L (ref 3.5–5.1)
Sodium: 137 mmol/L (ref 135–145)

## 2024-07-31 LAB — COOXEMETRY PANEL
Carboxyhemoglobin: 1.3 % (ref 0.5–1.5)
Methemoglobin: <0.7 % (ref 0.0–1.5)
O2 Saturation: 59.8 %
Total hemoglobin: 15.5 g/dL (ref 12.0–16.0)

## 2024-07-31 LAB — ECHO TEE: Est EF: <20

## 2024-07-31 MED ORDER — FUROSEMIDE 10 MG/ML IJ SOLN
80.0000 mg | Freq: Once | INTRAMUSCULAR | Status: AC
  Administered 2024-07-31: 80 mg via INTRAVENOUS
  Filled 2024-07-31: qty 8

## 2024-07-31 MED ORDER — FUROSEMIDE 40 MG PO TABS
40.0000 mg | ORAL_TABLET | Freq: Every day | ORAL | Status: DC
  Administered 2024-08-01: 40 mg via ORAL
  Filled 2024-07-31: qty 1

## 2024-07-31 NOTE — Progress Notes (Addendum)
 Patient ID: Benjamin Abbott, male   DOB: 1961/03/09, 63 y.o.   MRN: [id number]     Advanced Heart Failure Rounding Note  Cardiologist: Darryle ONEIDA Decent, MD  Chief Complaint: CHF Subjective:    - 11/3 S/P TEE & DC-CV x3. Failed to convert. Continue amio load.   Co-ox 60%, CVP 5 Net negative 2.2L, lasix held yesterday. Re-attempt DCCV tomorrow.  Feeling well this morning. Remains with cough, no shortness of breath. No CP.   Objective:    Weight Range: 84.2 kg Body mass index is 27.41 kg/m.   Vital Signs:   Temp:  [98.3 F (36.8 C)-98.7 F (37.1 C)] 98.5 F (36.9 C) (11/05 0410) Pulse Rate:  [47-106] 47 (11/05 0410) Resp:  [16-19] 17 (11/05 0410) BP: (89-132)/(75-93) 106/76 (11/05 0410) SpO2:  [92 %-100 %] 96 % (11/05 0410) Weight:  [84.2 kg] 84.2 kg (11/05 0500) Last BM Date : 07/30/24  Weight change: Filed Weights   07/29/24 0635 07/30/24 0423 07/31/24 0500  Weight: 83.5 kg 83.9 kg 84.2 kg   Intake/Output:  Intake/Output Summary (Last 24 hours) at 07/31/2024 0704 Last data filed at 07/31/2024 0626 Gross per 24 hour  Intake 1160 ml  Output 3380 ml  Net -2220 ml     Physical Exam   General: Well appearing. No distress on RA Cardiac: JVP flat. S1 and S2 present. No murmurs Resp: Lung sounds clear and equal B/L Abdomen: Soft, non-tender, non-distended.  Extremities: Warm and dry.  No peripheral edema.  Neuro: Alert and oriented x3. Affect pleasant. Moves all extremities without difficulty.  Telemetry   AF 70-80s (personally reviewed)  Labs    CBC Recent Labs    07/29/24 0435 07/30/24 0622  WBC 7.6 10.0  HGB 13.7 15.1  HCT 41.7 45.5  MCV 86.0 85.0  PLT 170 193   Basic Metabolic Panel Recent Labs    88/95/74 0622 07/31/24 0619  NA 138 137  K 4.1 4.3  CL 103 103  CO2 26 23  GLUCOSE 131* 149*  BUN 20 14  CREATININE 1.46* 1.33*  CALCIUM 8.1* 8.0*   BNP (last 3 results) Recent Labs    07/22/24 1844  BNP 1,405.0*   ProBNP (last 3  results) Recent Labs    07/21/24 1424  PROBNP 2,285.0*   Medications:    Scheduled Medications:  apixaban  5 mg Oral BID   Chlorhexidine  Gluconate Cloth  6 each Topical Daily   cholecalciferol  2,000 Units Oral Daily   digoxin  0.125 mg Oral Daily   empagliflozin  10 mg Oral Daily   famotidine  20 mg Oral BID   fluticasone  furoate-vilanterol  1 puff Inhalation Daily   gabapentin   300 mg Oral TID   hydrOXYzine  25 mg Oral QHS   loratadine  10 mg Oral Daily   methocarbamol   500 mg Oral BID   pantoprazole  40 mg Oral Daily   sacubitril-valsartan  1 tablet Oral BID   sodium chloride  flush  10-40 mL Intracatheter Q12H   sodium chloride  flush  3 mL Intravenous Q12H   sodium chloride  flush  3 mL Intravenous Q12H   spironolactone  25 mg Oral Daily    Infusions:  sodium chloride      amiodarone 30 mg/hr (07/31/24 0343)    PRN Medications: acetaminophen , albuterol , fluticasone , HYDROmorphone  (DILAUDID ) injection, menthol, montelukast, naLOXone (NARCAN)  injection, ondansetron , phenol, simethicone, sodium chloride  flush, sodium chloride  flush, sodium chloride  flush  Assessment/Plan   Acute systolic heart failure: Moderate, single-vessel CAD in  the RCA, predominant nonischemic cardiomyopathy.  Suspect etiology related to tachyarrhythmia (AF), though heavy alcohol use could also be considered. While his index was severely reduced, his endorgan function is relatively stable - Coox 60%. CVP 5, appears euvolemic. - Start Lasix 40 mg PO tomorrow - Continue digoxin 0.125 daily.  - Continue Entresto 24/26 bid.  - Continue Jardiance 10 daily . - Continue spironolactone to 25 daily.  - Hold on BB   Persistent atrial fibrillation: HR 70-90s - Holding on amiodarone with liver disease -11/3  TEE/DCCV failed x3 oday. Back on Amiodarone. Reattempt cardioversion once loaded> 11/6  - Consider eventual AF ablation.  - Suspect due to alcohol use - Synthetic function appears intact, GI  following - Plan for repeat DCCV tomorrow   Substance use: - Prior THC, cocaine - Heavy alcohol use  HTN - GDMT as above - Pressure improved.   Lives alone. Does not drive. Will TOC CM to consult. Substance Abuse counseling.   Length of Stay: 82  [name], NP  07/31/2024, 7:04 AM  Advanced Heart Failure Team Pager [phone] (M-F; 7a - 5p)  Please contact [facility] for night-coverage after hours (5p -7a ) and weekends on [email or web address]   Patient seen and examined with the above-signed Advanced Practice Provider and/or Housestaff. I personally reviewed laboratory data, imaging studies and relevant notes. I independently examined the patient and formulated the important aspects of the plan. I have edited the note to reflect any of my changes or salient points. I have personally discussed the plan with the patient and/or family.  Remains on IV amio. AF now rate controlled.   Co-ox 60% CVP 5   Feels ok. Denies orthopnea or PND. Renal function stable.   General:  Sitting up in bed. No resp difficulty HEENT: normal Neck: supple. JVP 7-8 Cor: Irregular rate & rhythm. No rubs, gallops or murmurs. Lungs: clear Abdomen: soft, nontender, nondistended.Good bowel sounds. Extremities: no cyanosis, clubbing, rash, edema Neuro: alert & orientedx3, cranial nerves grossly intact. moves all 4 extremities w/o difficulty. Affect pleasant  Will give one dose IV lasix today.   Continue IV amio and Eliquis. Plan repeat DC-CV tomorrow. \  Titrate GDMT as tolerated.  Toribio Fuel, MD  7:21 PM

## 2024-07-31 NOTE — H&P (View-Only) (Signed)
 Patient ID: Benjamin Abbott, male   DOB: 1961/03/09, 63 y.o.   MRN: [id number]     Advanced Heart Failure Rounding Note  Cardiologist: Darryle ONEIDA Decent, MD  Chief Complaint: CHF Subjective:    - 11/3 S/P TEE & DC-CV x3. Failed to convert. Continue amio load.   Co-ox 60%, CVP 5 Net negative 2.2L, lasix held yesterday. Re-attempt DCCV tomorrow.  Feeling well this morning. Remains with cough, no shortness of breath. No CP.   Objective:    Weight Range: 84.2 kg Body mass index is 27.41 kg/m.   Vital Signs:   Temp:  [98.3 F (36.8 C)-98.7 F (37.1 C)] 98.5 F (36.9 C) (11/05 0410) Pulse Rate:  [47-106] 47 (11/05 0410) Resp:  [16-19] 17 (11/05 0410) BP: (89-132)/(75-93) 106/76 (11/05 0410) SpO2:  [92 %-100 %] 96 % (11/05 0410) Weight:  [84.2 kg] 84.2 kg (11/05 0500) Last BM Date : 07/30/24  Weight change: Filed Weights   07/29/24 0635 07/30/24 0423 07/31/24 0500  Weight: 83.5 kg 83.9 kg 84.2 kg   Intake/Output:  Intake/Output Summary (Last 24 hours) at 07/31/2024 0704 Last data filed at 07/31/2024 0626 Gross per 24 hour  Intake 1160 ml  Output 3380 ml  Net -2220 ml     Physical Exam   General: Well appearing. No distress on RA Cardiac: JVP flat. S1 and S2 present. No murmurs Resp: Lung sounds clear and equal B/L Abdomen: Soft, non-tender, non-distended.  Extremities: Warm and dry.  No peripheral edema.  Neuro: Alert and oriented x3. Affect pleasant. Moves all extremities without difficulty.  Telemetry   AF 70-80s (personally reviewed)  Labs    CBC Recent Labs    07/29/24 0435 07/30/24 0622  WBC 7.6 10.0  HGB 13.7 15.1  HCT 41.7 45.5  MCV 86.0 85.0  PLT 170 193   Basic Metabolic Panel Recent Labs    88/95/74 0622 07/31/24 0619  NA 138 137  K 4.1 4.3  CL 103 103  CO2 26 23  GLUCOSE 131* 149*  BUN 20 14  CREATININE 1.46* 1.33*  CALCIUM 8.1* 8.0*   BNP (last 3 results) Recent Labs    07/22/24 1844  BNP 1,405.0*   ProBNP (last 3  results) Recent Labs    07/21/24 1424  PROBNP 2,285.0*   Medications:    Scheduled Medications:  apixaban  5 mg Oral BID   Chlorhexidine  Gluconate Cloth  6 each Topical Daily   cholecalciferol  2,000 Units Oral Daily   digoxin  0.125 mg Oral Daily   empagliflozin  10 mg Oral Daily   famotidine  20 mg Oral BID   fluticasone  furoate-vilanterol  1 puff Inhalation Daily   gabapentin   300 mg Oral TID   hydrOXYzine  25 mg Oral QHS   loratadine  10 mg Oral Daily   methocarbamol   500 mg Oral BID   pantoprazole  40 mg Oral Daily   sacubitril-valsartan  1 tablet Oral BID   sodium chloride  flush  10-40 mL Intracatheter Q12H   sodium chloride  flush  3 mL Intravenous Q12H   sodium chloride  flush  3 mL Intravenous Q12H   spironolactone  25 mg Oral Daily    Infusions:  sodium chloride      amiodarone 30 mg/hr (07/31/24 0343)    PRN Medications: acetaminophen , albuterol , fluticasone , HYDROmorphone  (DILAUDID ) injection, menthol, montelukast, naLOXone (NARCAN)  injection, ondansetron , phenol, simethicone, sodium chloride  flush, sodium chloride  flush, sodium chloride  flush  Assessment/Plan   Acute systolic heart failure: Moderate, single-vessel CAD in  the RCA, predominant nonischemic cardiomyopathy.  Suspect etiology related to tachyarrhythmia (AF), though heavy alcohol use could also be considered. While his index was severely reduced, his endorgan function is relatively stable - Coox 60%. CVP 5, appears euvolemic. - Start Lasix 40 mg PO tomorrow - Continue digoxin 0.125 daily.  - Continue Entresto 24/26 bid.  - Continue Jardiance 10 daily . - Continue spironolactone to 25 daily.  - Hold on BB   Persistent atrial fibrillation: HR 70-90s - Holding on amiodarone with liver disease -11/3  TEE/DCCV failed x3 oday. Back on Amiodarone. Reattempt cardioversion once loaded> 11/6  - Consider eventual AF ablation.  - Suspect due to alcohol use - Synthetic function appears intact, GI  following - Plan for repeat DCCV tomorrow   Substance use: - Prior THC, cocaine - Heavy alcohol use  HTN - GDMT as above - Pressure improved.   Lives alone. Does not drive. Will TOC CM to consult. Substance Abuse counseling.   Length of Stay: 82  [name], NP  07/31/2024, 7:04 AM  Advanced Heart Failure Team Pager [phone] (M-F; 7a - 5p)  Please contact [facility] for night-coverage after hours (5p -7a ) and weekends on [email or web address]   Patient seen and examined with the above-signed Advanced Practice Provider and/or Housestaff. I personally reviewed laboratory data, imaging studies and relevant notes. I independently examined the patient and formulated the important aspects of the plan. I have edited the note to reflect any of my changes or salient points. I have personally discussed the plan with the patient and/or family.  Remains on IV amio. AF now rate controlled.   Co-ox 60% CVP 5   Feels ok. Denies orthopnea or PND. Renal function stable.   General:  Sitting up in bed. No resp difficulty HEENT: normal Neck: supple. JVP 7-8 Cor: Irregular rate & rhythm. No rubs, gallops or murmurs. Lungs: clear Abdomen: soft, nontender, nondistended.Good bowel sounds. Extremities: no cyanosis, clubbing, rash, edema Neuro: alert & orientedx3, cranial nerves grossly intact. moves all 4 extremities w/o difficulty. Affect pleasant  Will give one dose IV lasix today.   Continue IV amio and Eliquis. Plan repeat DC-CV tomorrow. \  Titrate GDMT as tolerated.  Toribio Fuel, MD  7:21 PM

## 2024-07-31 NOTE — Anesthesia Preprocedure Evaluation (Signed)
 Anesthesia Evaluation  Patient identified by MRN, date of birth, ID band Patient awake    Reviewed: Allergy & Precautions, NPO status , Patient's Chart, lab work & pertinent test results  History of Anesthesia Complications Negative for: history of anesthetic complications  Airway Mallampati: II  TM Distance: >3 FB Neck ROM: Full    Dental  (+) Chipped   Pulmonary asthma , sleep apnea and Continuous Positive Airway Pressure Ventilation , COPD,  COPD inhaler, Patient abstained from smoking., former smoker   Pulmonary exam normal        Cardiovascular hypertension, Pt. on medications +CHF  Normal cardiovascular exam+ dysrhythmias Atrial Fibrillation + Valvular Problems/Murmurs MR    '25 Cath - Moderate to severe two vessel disease. 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 The left ventricular internal cavity size was moderately dilated. There is mild left ventricular hypertrophy. LAD akinesis with no LV thrombus. The right ventricular size is mildly enlarged. Left atrial size was mildly dilated. Right atrial size was moderately dilated. Moderate to severe mitral valve regurgitation. Tricuspid valve regurgitation is moderate to severe. Aortic valve regurgitation is trivial. Severely dilated pulmonary artery.     Neuro/Psych  Neuromuscular disease  negative psych ROS   GI/Hepatic ,GERD  Controlled,,(+) Cirrhosis     substance abuse  marijuana use Colon cancer    Endo/Other  negative endocrine ROS    Renal/GU Renal InsufficiencyRenal disease     Musculoskeletal  (+) Arthritis ,    Abdominal   Peds  Hematology negative hematology ROS (+)   Anesthesia Other Findings Chronic pain   Reproductive/Obstetrics                              Anesthesia Physical Anesthesia Plan  ASA: 4  Anesthesia Plan: General   Post-op Pain Management:  Minimal or no pain anticipated   Induction: Intravenous  PONV Risk Score and Plan: 2 and Propofol  infusion and Treatment may vary due to age or medical condition  Airway Management Planned: Natural Airway and Simple Face Mask  Additional Equipment: None  Intra-op Plan:   Post-operative Plan:   Informed Consent: I have reviewed the patients History and Physical, chart, labs and discussed the procedure including the risks, benefits and alternatives for the proposed anesthesia with the patient or authorized representative who has indicated his/her understanding and acceptance.       Plan Discussed with: CRNA and Anesthesiologist  Anesthesia Plan Comments:          Anesthesia Quick Evaluation

## 2024-07-31 NOTE — Plan of Care (Signed)

## 2024-07-31 NOTE — Progress Notes (Signed)
 PROGRESS NOTE    Benjamin Abbott  FMW:[id number] DOB: [birth year] DOA: 07/21/2024 PCP: Leontine Cramp, NP   Brief Narrative:  63 yr old man who presented to the ED at [facility] for complaints of abdominal bloated and swelling in his lower extremities as well as shortness of breath. In the ED the patient was found to have a BNP of 2000, a new finding of atrial fibrillation with rate control, and pulmonary vascular congestion. He also was found to have a protuberant abdomen with swelling in his legs bilaterally.  Patient was hospitalized for further management.   CXR demonstrated pulmonary vascular congestion, CT of the abdomen and pelvis demonstrated ascites and hepatic cirrhosis.   The patient was transferred to [facility] for admission. Echocatdiogram was obtained that demonstrated severe systolic CHF with EF of 25%.    Cardiology was consulted. They recommended a right and a left heart catheterization.  He underwent EGD first on 10/31 to r/o potential issues with anticoagulation or antithrombotics.    The patient underwent EGD with Dr. Rosalie. There was no bleeding noted. He then underwent catheterization (right and left heat) with Dr. Barbaraann. It demonstrated cardiomyopathy out of proportion to CAD. 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  There is also evidence of right heart failure.    Now s/p PICC placement.  HF team is managing his volume/diuretics.    He's s/p TEE cardioversion 11/3, but back in afib.  Plan is to repeat cardioversion after amiodarone load on 11/6.     Assessment & Plan:   HFrEF Volume Overload  Echocardiogram shows LVEF of 25%  S/p R/LHC with moderate to severe 2 vessel disease, nonischemic cardiomyopathy with severe biventricular failure.   Now s/p PICC placement.  HF team recommending entresto, jardiance, spironolactone- diuretics on hold per cards.  Beta blocker on hold. Strict I/O, daily weights  Atrial Fibrillation  S/p TEE/cardioversion 11/3,  but converted back into afib Amiodarone per cards - plan for repeat cardioversion 11/6 Patient is on apixaban.  Also noted to be on digoxin.  Cramping  Lytes ok Will continue robaxin  Denies any cramping today.  Acute Kidney Injury Baseline appears to be ~1.16 Seems to be stable for the most part.  Will need to tolerate a high level of creatinine.  Cirrhosis Unclear what outpatient follow up he has for this - will need outpatient follow up US  ascites with small volume simple ascites in r upper and right lower quadrants\ Repeat US  without significant ascites. He underwent EGD on 10/31 which did not show any acute findings.  Normal esophagus was noted.  Outpatient follow-up.  Hypertension Amlodipine  on hold Appears he's not taking losartan   Asthma No si/sx exacerbation  DVT prophylaxis: eliquis Code Status: full Family Communication: none Disposition: Home when improved.    Consultants:  Cardiology GI  Procedures:  TEE cardioversion    R/LHC Moderate to severe two vessel disease Compensated nonischemic cardiomyopathy with severe biventricular failure   Echo IMPRESSIONS   1. Left ventricular ejection fraction, by estimation, is 25 to 30%. The  left ventricle has severely decreased function. The left ventricle  demonstrates regional wall motion abnormalities (see scoring  diagram/findings for description). The left  ventricular internal cavity size was moderately dilated. There is mild  left ventricular hypertrophy. Left ventricular diastolic parameters are  indeterminate. LAD akinesis with no LV thrombus.   2. Right ventricular systolic function is normal. The right ventricular  size is mildly enlarged. There is normal pulmonary artery systolic  pressure.  3. Left atrial size was mildly dilated.   4. Right atrial size was moderately dilated.   5. The mitral valve is normal in structure. Moderate to severe mitral  valve regurgitation. No evidence of mitral  stenosis.   6. Tricuspid valve regurgitation is moderate to severe.   7. The aortic valve is tricuspid. Aortic valve regurgitation is trivial.  No aortic stenosis is present.   8. Severely dilated pulmonary artery.   9. The inferior vena cava is normal in size with greater than 50%  respiratory variability, suggesting right atrial pressure of 3 mmHg.   Comparison(s): No prior Echocardiogram.   Conclusion(s)/Recommendation(s): Reaching out to Dr. [name].   EGD Normal esophagus, small amount of food residue in stomach, normal stomach, normal duodenal buld, first portion of duodenum, second portion of the duodenum, third portion of duodenum and 4th portion of duodenum    Subjective: Complains of sore throat.  Some pain at the site of cardioversion.  Denies any shortness of breath.    Objective: Vitals:   07/31/24 0027 07/31/24 0410 07/31/24 0500 07/31/24 0908  BP: 132/83 106/76    Pulse: 61 (!) 47    Resp: 16 17    Temp: 98.7 F (37.1 C) 98.5 F (36.9 C)    TempSrc: Oral Oral    SpO2: 96% 96%  95%  Weight:   84.2 kg   Height:        Intake/Output Summary (Last 24 hours) at 07/31/2024 0915 Last data filed at 07/31/2024 0700 Gross per 24 hour  Intake 1280 ml  Output 2880 ml  Net -1600 ml   Filed Weights   07/29/24 0635 07/30/24 0423 07/31/24 0500  Weight: 83.5 kg 83.9 kg 84.2 kg    Examination:  General appearance: Awake alert.  In no distress Oral lesions identified. Resp: Clear to auscultation bilaterally.  Normal effort Cardio: S1-S2 is irregularly irregular.  No S3-S4.  No rubs murmurs or bruit GI: Abdomen is soft.  Nontender nondistended.  Bowel sounds are present normal.  No masses organomegaly Extremities: No edema.  Full range of motion of lower extremities. Neurologic: Alert and oriented x3.  No focal neurological deficits.     Data Reviewed:   CBC: Recent Labs  Lab 07/26/24 0631 07/26/24 1601 07/26/24 1618 07/27/24 0233 07/28/24 0342  07/29/24 0435 07/30/24 0622  WBC 8.2  --   --  7.1 8.9 7.6 10.0  HGB 13.4   < > 13.6  13.9 13.8 13.6 13.7 15.1  HCT 40.1   < > 40.0  41.0 40.7 41.3 41.7 45.5  MCV 84.8  --   --  86.0 86.0 86.0 85.0  PLT 190  --   --  197 193 170 193   < > = values in this interval not displayed.    Basic Metabolic Panel: Recent Labs  Lab 07/27/24 0233 07/28/24 0342 07/28/24 0642 07/29/24 0435 07/30/24 0622 07/31/24 0619  NA 140 137  --  137 138 137  K 3.8 4.9  --  4.1 4.1 4.3  CL 105 106  --  104 103 103  CO2 24 24  --  23 26 23   GLUCOSE 133* 110*  --  126* 131* 149*  BUN 16 21  --  19 20 14   CREATININE 1.39* 1.31*  --  1.32* 1.46* 1.33*  CALCIUM 8.3* 8.6*  --  8.0* 8.1* 8.0*  MG  --   --  2.0  --   --   --  GFR: Estimated Creatinine Clearance: 58.3 mL/min (A) (by C-G formula based on SCr of 1.33 mg/dL (H)).  Radiology Studies: EP STUDY Result Date: 07/29/2024 See surgical note for result.  Scheduled Meds:  apixaban  5 mg Oral BID   Chlorhexidine  Gluconate Cloth  6 each Topical Daily   cholecalciferol  2,000 Units Oral Daily   digoxin  0.125 mg Oral Daily   empagliflozin  10 mg Oral Daily   famotidine  20 mg Oral BID   fluticasone  furoate-vilanterol  1 puff Inhalation Daily   [START ON 08/01/2024] furosemide  40 mg Oral Daily   gabapentin   300 mg Oral TID   hydrOXYzine  25 mg Oral QHS   loratadine  10 mg Oral Daily   methocarbamol   500 mg Oral BID   pantoprazole  40 mg Oral Daily   sacubitril-valsartan  1 tablet Oral BID   sodium chloride  flush  10-40 mL Intracatheter Q12H   sodium chloride  flush  3 mL Intravenous Q12H   sodium chloride  flush  3 mL Intravenous Q12H   spironolactone  25 mg Oral Daily   Continuous Infusions:  sodium chloride      amiodarone 30 mg/hr (07/31/24 0343)      LOS: 9 days   Karaline Buresh, MD [facility]  To contact the attending provider between 7A-7P or the covering provider during after hours 7P-7A, please log into the web site  [email or web address] and access using universal [place] password for that web site. If you do not have the password, please call the hospital operator.  07/31/2024, 9:15 AM

## 2024-07-31 NOTE — TOC Progression Note (Signed)
 Transition of Care [facility]) - Progression Note    Patient Details  Name: Benjamin Abbott MRN: [id number] Date of Birth: [birth year]  Transition of [facility]) CM/SW Contact  [name] Phone Number: [phone] 07/31/2024, 11:38 AM  Clinical Narrative:   HF CSW met with patient at bedside. CSW addressed SA consult. Patient accepted SA resources. Patient inquired about transportation resources. CSW explained contacting insurance to inquire if transportation is in his plan. If so, call 3-5 days in advance and get on transportation list. CSW also provided transportation information resources and left at bedside and on AVS.   HF CSW/CM will continue to follow and monitor for dc readiness.                       Expected Discharge Plan and Services                                               Social Drivers of Health (SDOH) Interventions SDOH Screenings   Food Insecurity: No Food Insecurity (07/22/2024)  Housing: Unknown (07/24/2024)  Transportation Needs: No Transportation Needs (07/22/2024)  Utilities: Not At Risk (07/22/2024)  Alcohol Screen: Low Risk  (07/24/2024)  Financial Resource Strain: Low Risk  (07/24/2024)  Social Connections: Unknown (11/08/2022)   Received from [facility]  Tobacco Use: Medium Risk (07/26/2024)    Readmission Risk Interventions    07/25/2024    2:07 PM  Readmission Risk Prevention Plan  Medication Screening Complete  Transportation Screening Complete

## 2024-08-01 ENCOUNTER — Encounter (HOSPITAL_COMMUNITY): Payer: Self-pay | Admitting: Internal Medicine

## 2024-08-01 ENCOUNTER — Inpatient Hospital Stay (HOSPITAL_COMMUNITY): Admitting: Anesthesiology

## 2024-08-01 ENCOUNTER — Other Ambulatory Visit (HOSPITAL_COMMUNITY): Payer: Self-pay

## 2024-08-01 ENCOUNTER — Encounter (HOSPITAL_COMMUNITY)
Admission: EM | Disposition: A | Discharge status: Home / Self Care | Payer: Self-pay | Source: Home / Self Care | Attending: Internal Medicine

## 2024-08-01 DIAGNOSIS — I5043 Acute on chronic combined systolic (congestive) and diastolic (congestive) heart failure: Secondary | ICD-10-CM | POA: Diagnosis not present

## 2024-08-01 DIAGNOSIS — I4891 Unspecified atrial fibrillation: Secondary | ICD-10-CM | POA: Diagnosis not present

## 2024-08-01 DIAGNOSIS — I110 Hypertensive heart disease with heart failure: Secondary | ICD-10-CM

## 2024-08-01 DIAGNOSIS — I4819 Other persistent atrial fibrillation: Secondary | ICD-10-CM | POA: Diagnosis not present

## 2024-08-01 DIAGNOSIS — I5023 Acute on chronic systolic (congestive) heart failure: Secondary | ICD-10-CM | POA: Diagnosis not present

## 2024-08-01 HISTORY — PX: CARDIOVERSION: EP1203

## 2024-08-01 LAB — HEPATIC FUNCTION PANEL
ALT: 47 U/L — ABNORMAL HIGH (ref 0–44)
AST: 35 U/L (ref 15–41)
Albumin: 2.9 g/dL — ABNORMAL LOW (ref 3.5–5.0)
Alkaline Phosphatase: 74 U/L (ref 38–126)
Bilirubin, Direct: 0.2 mg/dL (ref 0.0–0.2)
Indirect Bilirubin: 0.6 mg/dL (ref 0.3–0.9)
Total Bilirubin: 0.8 mg/dL (ref 0.0–1.2)
Total Protein: 6 g/dL — ABNORMAL LOW (ref 6.5–8.1)

## 2024-08-01 LAB — BASIC METABOLIC PANEL WITH GFR
Anion gap: 10 (ref 5–15)
BUN: 15 mg/dL (ref 8–23)
CO2: 25 mmol/L (ref 22–32)
Calcium: 8.4 mg/dL — ABNORMAL LOW (ref 8.9–10.3)
Chloride: 99 mmol/L (ref 98–111)
Creatinine, Ser: 1.41 mg/dL — ABNORMAL HIGH (ref 0.61–1.24)
GFR, Estimated: 57 mL/min — ABNORMAL LOW (ref >60)
Glucose, Bld: 184 mg/dL — ABNORMAL HIGH (ref 70–99)
Potassium: 4.4 mmol/L (ref 3.5–5.1)
Sodium: 134 mmol/L — ABNORMAL LOW (ref 135–145)

## 2024-08-01 LAB — COOXEMETRY PANEL
Carboxyhemoglobin: 2.1 % — ABNORMAL HIGH (ref 0.5–1.5)
Methemoglobin: <0.7 % (ref 0.0–1.5)
O2 Saturation: 70.9 %
Total hemoglobin: 15.1 g/dL (ref 12.0–16.0)

## 2024-08-01 SURGERY — CARDIOVERSION (CATH LAB)
Anesthesia: General

## 2024-08-01 MED ORDER — SACUBITRIL-VALSARTAN 24-26 MG PO TABS
1.0000 | ORAL_TABLET | Freq: Two times a day (BID) | ORAL | 0 refills | Status: DC
  Filled 2024-08-01: qty 120, 60d supply, fill #0

## 2024-08-01 MED ORDER — AMIODARONE HCL 200 MG PO TABS
200.0000 mg | ORAL_TABLET | Freq: Two times a day (BID) | ORAL | 0 refills | Status: DC
  Filled 2024-08-01: qty 120, 60d supply, fill #0

## 2024-08-01 MED ORDER — EMPAGLIFLOZIN 10 MG PO TABS
10.0000 mg | ORAL_TABLET | Freq: Every day | ORAL | 0 refills | Status: AC
  Filled 2024-08-01: qty 60, 60d supply, fill #0

## 2024-08-01 MED ORDER — PROPOFOL 10 MG/ML IV BOLUS
INTRAVENOUS | Status: DC | PRN
  Administered 2024-08-01: 30 mg via INTRAVENOUS
  Administered 2024-08-01: 40 mg via INTRAVENOUS
  Administered 2024-08-01: 30 mg via INTRAVENOUS

## 2024-08-01 MED ORDER — FUROSEMIDE 40 MG PO TABS
40.0000 mg | ORAL_TABLET | Freq: Every day | ORAL | 0 refills | Status: AC
  Filled 2024-08-01: qty 60, 60d supply, fill #0

## 2024-08-01 MED ORDER — DIGOXIN 125 MCG PO TABS
0.1250 mg | ORAL_TABLET | Freq: Every day | ORAL | 0 refills | Status: AC
  Filled 2024-08-01: qty 60, 60d supply, fill #0

## 2024-08-01 MED ORDER — AMIODARONE HCL 200 MG PO TABS
200.0000 mg | ORAL_TABLET | Freq: Two times a day (BID) | ORAL | Status: DC
  Administered 2024-08-01: 200 mg via ORAL
  Filled 2024-08-01: qty 1

## 2024-08-01 MED ORDER — SPIRONOLACTONE 25 MG PO TABS
25.0000 mg | ORAL_TABLET | Freq: Every day | ORAL | 0 refills | Status: AC
  Filled 2024-08-01: qty 60, 60d supply, fill #0

## 2024-08-01 MED ORDER — APIXABAN 5 MG PO TABS
5.0000 mg | ORAL_TABLET | Freq: Two times a day (BID) | ORAL | 0 refills | Status: AC
  Filled 2024-08-01: qty 120, 60d supply, fill #0

## 2024-08-01 SURGICAL SUPPLY — 1 items: PAD DEFIB RADIO PHYSIO CONN (PAD) ×1 IMPLANT

## 2024-08-01 NOTE — Progress Notes (Signed)
 Patient ID: Benjamin Abbott, male   DOB: 26-Jan-1961, 63 y.o.   MRN: [id number]     Advanced Heart Failure Rounding Note  Cardiologist: Darryle ONEIDA Decent, MD  Chief Complaint: CHF Subjective:    - 11/3 S/P TEE & DC-CV x3. Failed to convert.   Remains on IV amio    Sleepy this morning but says he feels ok  Co-ox 71% CVP low  Underwent DC-CV today with brief conversion to NSR then back to AF  Feeling well this morning. Remains with cough, no shortness of breath. No CP.   Objective:    Weight Range: 85.1 kg Body mass index is 27.71 kg/m.   Vital Signs:   Temp:  [97.5 F (36.4 C)-97.9 F (36.6 C)] 97.7 F (36.5 C) (11/06 0747) Pulse Rate:  [25-99] 76 (11/06 0747) Resp:  [13-24] 14 (11/06 0747) BP: (104-139)/(62-83) 126/78 (11/06 0747) SpO2:  [95 %-100 %] 96 % (11/06 0747) Weight:  [85.1 kg] 85.1 kg (11/06 0604) Last BM Date : 07/30/24  Weight change: Filed Weights   07/31/24 0500 08/01/24 0335 08/01/24 0604  Weight: 84.2 kg 85.1 kg 85.1 kg   Intake/Output:  Intake/Output Summary (Last 24 hours) at 08/01/2024 0755 Last data filed at 08/01/2024 0400 Gross per 24 hour  Intake 1611.83 ml  Output 2700 ml  Net -1088.17 ml     Physical Exam   General:  Sitting up in bed. No resp difficulty HEENT: normal Neck: supple. no JVD.  Cor: Irregular rate & rhythm. No rubs, gallops or murmurs. Lungs: clear Abdomen: soft, nontender, nondistended.Good bowel sounds. Extremities: no cyanosis, clubbing, rash, edema Neuro: alert & orientedx3, cranial nerves grossly intact. moves all 4 extremities w/o difficulty. Affect pleasant   Telemetry   AF 70-80s (personally reviewed)  Labs    CBC Recent Labs    07/30/24 0622  WBC 10.0  HGB 15.1  HCT 45.5  MCV 85.0  PLT 193   Basic Metabolic Panel Recent Labs    88/94/74 0619 08/01/24 0410  NA 137 134*  K 4.3 4.4  CL 103 99  CO2 23 25  GLUCOSE 149* 184*  BUN 14 15  CREATININE 1.33* 1.41*  CALCIUM 8.0* 8.4*   BNP (last 3  results) Recent Labs    07/22/24 1844  BNP 1,405.0*   ProBNP (last 3 results) Recent Labs    07/21/24 1424  PROBNP 2,285.0*   Medications:    Scheduled Medications:  [MAR Hold] apixaban  5 mg Oral BID   [MAR Hold] Chlorhexidine  Gluconate Cloth  6 each Topical Daily   [MAR Hold] cholecalciferol  2,000 Units Oral Daily   [MAR Hold] digoxin  0.125 mg Oral Daily   [MAR Hold] empagliflozin  10 mg Oral Daily   [MAR Hold] famotidine  20 mg Oral BID   [MAR Hold] fluticasone  furoate-vilanterol  1 puff Inhalation Daily   [MAR Hold] furosemide  40 mg Oral Daily   [MAR Hold] gabapentin   300 mg Oral TID   [MAR Hold] hydrOXYzine  25 mg Oral QHS   [MAR Hold] loratadine  10 mg Oral Daily   [MAR Hold] methocarbamol   500 mg Oral BID   [MAR Hold] pantoprazole  40 mg Oral Daily   [MAR Hold] sacubitril-valsartan  1 tablet Oral BID   [MAR Hold] sodium chloride  flush  10-40 mL Intracatheter Q12H   [MAR Hold] sodium chloride  flush  3 mL Intravenous Q12H   [MAR Hold] sodium chloride  flush  3 mL Intravenous Q12H   [MAR Hold] spironolactone  25 mg Oral Daily    Infusions:  amiodarone 30 mg/hr (08/01/24 0408)    PRN Medications: [MAR Hold] acetaminophen , [MAR Hold] albuterol , [MAR Hold] fluticasone , [MAR Hold]  HYDROmorphone  (DILAUDID ) injection, [MAR Hold] menthol, [MAR Hold] montelukast, [MAR Hold] naLOXone (NARCAN)  injection, [MAR Hold] ondansetron , [MAR Hold] phenol, [MAR Hold] simethicone, [MAR Hold] sodium chloride  flush, [MAR Hold] sodium chloride  flush, [MAR Hold] sodium chloride  flush  Assessment/Plan   Acute systolic heart failure: Moderate, single-vessel CAD in the RCA, predominant nonischemic cardiomyopathy.  Suspect etiology related to tachyarrhythmia (AF), though heavy alcohol use could also be considered. While his index was severely reduced, his endorgan function is relatively stable - Coox 71%. CVP low, appears euvolemic. - Now on Lasix 40 mg PO daily - Continue digoxin 0.125  daily.  - Continue Entresto 24/26 bid.  - Continue Jardiance 10 daily . - Continue spironolactone to 25 daily.  - Hold on BB   Persistent atrial fibrillation: HR 70-90s -11/3  TEE/DCCV failed Back on Amiodarone.  - Reattempt cardioversion today 11/6 on IV amio -> failed - He is now rate controlled on amio and tolerating well - Can stop amio gtt switch to amio 200 bid  - Can go home on po amio 200 bid + Eliquis and reattempt DC-CV again in 1 month as outpatient - Consider eventual AF ablation.     Substance use: - Prior THC, cocaine - Heavy alcohol use  HTN - GDMT as above - BP improved  Lives alone. Does not drive. Will TOC CM to consult. Substance Abuse counseling.   Can go home today from HF standpoint   Length of Stay: 10  Toribio Fuel, MD  08/01/2024, 7:55 AM  Advanced Heart Failure Team Pager [phone] (M-F; 7a - 5p)  Please contact [facility] for night-coverage after hours (5p -7a ) and weekends on [email or web address]

## 2024-08-01 NOTE — TOC Transition Note (Addendum)
 Transition of Care [facility]) - Discharge Note   Patient Details  Name: Benjamin Abbott MRN: [id number] Date of Birth: 1960-12-15  Transition [facility] Care [facility]) CM/SW Contact:  Justina Delcia Czar, RN Phone Number: [phone] 08/01/2024, 10:07 AM   Clinical Narrative:     Spoke to pt and states his SO will provide transportation to home. States he take bus or SO will take him to appts. States his PCP's office will provide transportation to appts.   Appt scheduled with [facility] for 08/05/2024 at 2:20 pm. Faxed dc summary to PCP's office.   Provided pt with Living Better with HF booklet. States he has to get battery for his scale.   Discussion on substance abuse counseling and resources. Pt states he plans to quit smoking. Denies using illicit drugs.   Final next level of care: Home/Self Care Barriers to Discharge: No Barriers Identified   Patient Goals and CMS Choice Patient states their goals for this hospitalization and ongoing recovery are:: wants to remain independent          Discharge Placement                       Discharge Plan and Services Additional resources added to the After Visit Summary for     Discharge Planning Services: CM Consult                                 Social Drivers of Health (SDOH) Interventions SDOH Screenings   Food Insecurity: No Food Insecurity (07/22/2024)  Housing: Unknown (07/24/2024)  Transportation Needs: No Transportation Needs (07/22/2024)  Utilities: Not At Risk (07/22/2024)  Alcohol Screen: Low Risk  (07/24/2024)  Financial Resource Strain: Low Risk  (07/24/2024)  Social Connections: Unknown (11/08/2022)   Received from [facility]  Tobacco Use: Medium Risk (07/26/2024)     Readmission Risk Interventions    07/25/2024    2:07 PM  Readmission Risk Prevention Plan  Medication Screening Complete  Transportation Screening Complete

## 2024-08-01 NOTE — Progress Notes (Signed)
 PICC removed per protocol per MD order. Manual pressure applied for 5 mins. Vaseline gauze, gauze, and Tegaderm applied over insertion site. No bleeding or swelling noted. Instructed patient to remain in bed for thirty mins. Educated patient about S/S of infection and when to call MD; no heavy lifting or pressure on right side for 24 hours; keep dressing dry and intact for 24 hours. Pt verbalized comprehension.

## 2024-08-01 NOTE — Care Management Important Message (Signed)
 Important Message  Patient Details  Name: Benjamin Abbott MRN: [id number] Date of Birth: March 10, 1961   Important Message Given:  Yes - Medicare IM     Benjamin Abbott 08/01/2024, 1:22 PM

## 2024-08-01 NOTE — Interval H&P Note (Signed)
 History and Physical Interval Note:  08/01/2024 7:07 AM  Benjamin Abbott  has presented today for surgery, with the diagnosis of afib.  The various methods of treatment have been discussed with the patient and family. After consideration of risks, benefits and other options for treatment, the patient has consented to  Procedure(s): CARDIOVERSION (N/A) as a surgical intervention.  The patient's history has been reviewed, patient examined, no change in status, stable for surgery.  I have reviewed the patient's chart and labs.  Questions were answered to the patient's satisfaction.     Urho Rio

## 2024-08-01 NOTE — CV Procedure (Signed)
    DIRECT CURRENT CARDIOVERSION  NAME:  Benjamin Abbott   MRN: [id number] DOB:  05-21-1961   ADMIT DATE: 07/21/2024   INDICATIONS: Atrial fibrillation    PROCEDURE:   Informed consent was obtained prior to the procedure. The risks, benefits and alternatives for the procedure were discussed and the patient comprehended these risks. Once an appropriate time out was taken, the patient had the defibrillator pads placed in the anterior and posterior position. The patient then underwent sedation by the anesthesia service. Once an appropriate level of sedation was achieved, the patient received a single biphasic, synchronized 200J shock with brief conversion to sinus rhythm. He went back to AF and received as second 250J shock. He held sinus for about 20 seconds and then reverted back to AF. No apparent complications.  Toribio Fuel, MD  7:54 AM

## 2024-08-01 NOTE — Transfer of Care (Signed)
 Immediate Anesthesia Transfer of Care Note  Patient: Benjamin Abbott  Procedure(s) Performed: CARDIOVERSION  Patient Location: Cath Lab  Anesthesia Type:MAC  Level of Consciousness: drowsy  Airway & Oxygen Therapy: Patient Spontanous Breathing and Patient connected to nasal cannula oxygen  Post-op Assessment: Report given to RN and Post -op Vital signs reviewed and stable  Post vital signs: Reviewed and stable  Last Vitals:  Vitals Value Taken Time  BP 104/73 07/31/24      0742  Temp    Pulse 88 07/31/24      0742  Resp 30 07/31/24      0742  SpO2 94 07/31/24      0742    Last Pain:  Vitals:   08/01/24 0705  TempSrc:   PainSc: 0-No pain      Patients Stated Pain Goal: 0 (07/28/24 0818)  Complications: No notable events documented.

## 2024-08-01 NOTE — Discharge Summary (Signed)
 [facility]  Physician Discharge Summary   Patient ID: Benjamin Abbott MRN: [id number] DOB/AGE: Jul 15, 1961 63 y.o.  Admit date: 07/21/2024 Discharge date: 08/01/2024    PCP: Leontine Cramp, NP  DISCHARGE DIAGNOSES:    Asthma   Tobacco dependence   Essential hypertension   Paroxysmal atrial fibrillation (HCC)   Hepatic cirrhosis (HCC)   Elevated troponin   Atrial fibrillation (HCC)   Demand ischemia (HCC)   Acute on chronic combined systolic and diastolic CHF (congestive heart failure) (HCC)   RECOMMENDATIONS FOR OUTPATIENT FOLLOW UP: Cardiology to arrange outpatient follow-up   Home Health: None Equipment/Devices: None  CODE STATUS: Full code  DISCHARGE CONDITION: fair  Diet recommendation: Heart healthy  INITIAL HISTORY: 63 yr old man who presented to the ED at [facility] for complaints of abdominal bloated and swelling in his lower extremities as well as shortness of breath. In the ED the patient was found to have a BNP of 2000, a new finding of atrial fibrillation with rate control, and pulmonary vascular congestion. He also was found to have a protuberant abdomen with swelling in his legs bilaterally.  Patient was hospitalized for further management.   CXR demonstrated pulmonary vascular congestion, CT of the abdomen and pelvis demonstrated ascites and hepatic cirrhosis.   The patient was transferred to [facility] for admission. Echocatdiogram was obtained that demonstrated severe systolic CHF with EF of 25%.    Cardiology was consulted. They recommended a right and a left heart catheterization.  He underwent EGD first on 10/31 to r/o potential issues with anticoagulation or antithrombotics.    The patient underwent EGD with Dr. Rosalie. There was no bleeding noted. He then underwent catheterization (right and left heat) with Dr. Barbaraann. It demonstrated cardiomyopathy out of proportion to CAD. 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  There is also  evidence of right heart failure.    Now s/p PICC placement.  HF team is managing his volume/diuretics.     He's s/p TEE cardioversion 11/3, but back in afib.  Plan is to repeat cardioversion after amiodarone load on 11/6.    HOSPITAL COURSE:   Acute systolic CHF Echocardiogram shows LVEF of 25%  S/p R/LHC with moderate to severe 2 vessel disease, nonischemic cardiomyopathy with severe biventricular failure.   Patient was seen by heart failure team.  Has been optimized.  Okay for discharge. He will be discharged on Entresto, Jardiance, furosemide, digoxin.   Atrial Fibrillation  S/p TEE/cardioversion 11/3, but converted back into afib Amiodarone per cards - plan for repeat cardioversion 11/6.  Underwent cardioversion on 11/6 however was only briefly in sinus rhythm.  Plan is to continue amiodarone and reattempt cardioversion in 1 month.  Continue anticoagulation.   Acute Kidney Injury Baseline appears to be ~1.16 Seems to be stable for the most part.  Will need to tolerate a high level of creatinine.   Hepatic cirrhosis Unclear what outpatient follow up he has for this - will need outpatient follow up US  ascites with small volume simple ascites in r upper and right lower quadrants\ Repeat US  without significant ascites. He underwent EGD on 10/31 which did not show any acute findings.  Normal esophagus was noted.  Outpatient follow-up with gastroenterology. Does admit to alcohol use.  He was counseled.   Asthma No si/sx exacerbation  Patient is stable.  Cleared by cardiology for discharge.  PERTINENT LABS:  The results of significant diagnostics from this hospitalization (including imaging, microbiology, ancillary and laboratory) are listed below for  reference.      Labs:   Basic Metabolic Panel: Recent Labs  Lab 07/28/24 0342 07/28/24 0642 07/29/24 0435 07/30/24 0622 07/31/24 0619 08/01/24 0410  NA 137  --  137 138 137 134*  K 4.9  --  4.1 4.1 4.3 4.4  CL 106   --  104 103 103 99  CO2 24  --  23 26 23 25   GLUCOSE 110*  --  126* 131* 149* 184*  BUN 21  --  19 20 14 15   CREATININE 1.31*  --  1.32* 1.46* 1.33* 1.41*  CALCIUM 8.6*  --  8.0* 8.1* 8.0* 8.4*  MG  --  2.0  --   --   --   --    Liver Function Tests: Recent Labs  Lab 08/01/24 0410  AST 35  ALT 47*  ALKPHOS 74  BILITOT 0.8  PROT 6.0*  ALBUMIN 2.9*   CBC: Recent Labs  Lab 07/26/24 1618 07/27/24 0233 07/28/24 0342 07/29/24 0435 07/30/24 0622  WBC  --  7.1 8.9 7.6 10.0  HGB 13.6  13.9 13.8 13.6 13.7 15.1  HCT 40.0  41.0 40.7 41.3 41.7 45.5  MCV  --  86.0 86.0 86.0 85.0  PLT  --  197 193 170 193    IMAGING STUDIES EP STUDY Result Date: 08/01/2024 See surgical note for result.  ECHO TEE Result Date: 07/31/2024    TRANSESOPHOGEAL ECHO REPORT   Patient Name:   Benjamin Abbott Date of Exam: 07/29/2024 Medical Rec #:  [id number]      Height:       69.0 in Accession #:    [id number]     Weight:       184.0 lb Date of Birth:  [birth year]       BSA:          1.994 m Patient Age:    63 years       BP:           127/79 mmHg Patient Gender: M              HR:           152 bpm. Exam Location:  Inpatient Procedure: Transesophageal Echo and Color Doppler (Both Spectral and Color Flow            Doppler were utilized during procedure). Indications:     Cardioversion  History:         Patient has prior history of Echocardiogram examinations, most                  recent 07/23/2024.  Sonographer:     Tinnie Gosling RDCS Referring Phys:  [id number] RAMAN [facility] Diagnosing Phys: Toribio Fuel MD PROCEDURE: After discussion of the risks and benefits of a TEE, an informed consent was obtained from the patient. The transesophogeal probe was passed without difficulty through the esophogus of the patient. Sedation performed by different physician. The patient was monitored while under deep sedation. Anesthestetic sedation was provided intravenously by Anesthesiology: 145.17mg  of Propofol . The patient  developed no complications during the procedure.  IMPRESSIONS  1. Left ventricular ejection fraction, by estimation, is <20%. The left ventricle has severely decreased function. The left ventricle demonstrates global hypokinesis. The left ventricular internal cavity size was severely dilated.  2. Right ventricular systolic function is severely reduced. The right ventricular size is normal.  3. Left atrial size was moderately dilated. No left atrial/left atrial appendage thrombus was detected.  4. There  is no evidence of cardiac tamponade.  5. The mitral valve is normal in structure. Mild mitral valve regurgitation.  6. The aortic valve is tricuspid. Aortic valve regurgitation is mild. FINDINGS  Left Ventricle: Left ventricular ejection fraction, by estimation, is <20%. The left ventricle has severely decreased function. The left ventricle demonstrates global hypokinesis. The left ventricular internal cavity size was severely dilated. Right Ventricle: The right ventricular size is normal. No increase in right ventricular wall thickness. Right ventricular systolic function is severely reduced. Left Atrium: Left atrial size was moderately dilated. No left atrial/left atrial appendage thrombus was detected. Right Atrium: Right atrial size was normal in size. Pericardium: Trivial pericardial effusion is present. There is no evidence of cardiac tamponade. Mitral Valve: The mitral valve is normal in structure. Mild mitral valve regurgitation. Tricuspid Valve: The tricuspid valve is normal in structure. Tricuspid valve regurgitation is trivial. Aortic Valve: The aortic valve is tricuspid. Aortic valve regurgitation is mild. Pulmonic Valve: The pulmonic valve was normal in structure. Pulmonic valve regurgitation is trivial. Aorta: The aortic root is normal in size and structure. IAS/Shunts: No atrial level shunt detected by color flow Doppler. There is no evidence of a patent foramen ovale. There is no evidence of an atrial  septal defect. Toribio Fuel MD Electronically signed by Toribio Fuel MD Signature Date/Time: 07/31/2024/3:19:49 PM    Final    EP STUDY Result Date: 07/29/2024 See surgical note for result.  US  ASCITES (ABDOMEN LIMITED) Result Date: 07/28/2024 EXAM: LIMITED ABDOMINAL ULTRASOUND FOR ASCITES EVALUATION TECHNIQUE: Limited real-time sonography of all 4 quadrants of the abdomen was performed for evaluation of ascites. CLINICAL HISTORY: Ascites, [id number]. COMPARISON: US  Abdomen Limited 07/22/2024. FINDINGS: RIGHT UPPER QUADRANT: No ascites seen. LEFT UPPER QUADRANT: No ascites seen. RIGHT LOWER QUADRANT: No ascites seen. LEFT LOWER QUADRANT: No ascites seen. OTHER: Limited visualization of the rest of the abdomen demonstrates no acute abnormality. IMPRESSION: 1. No significant ascites. Electronically signed by: Francis Quam MD 07/28/2024 05:34 AM EST RP Workstation: [id number]   DG CHEST PORT 1 VIEW Result Date: 07/27/2024 EXAM: 1 VIEW(S) XRAY OF THE CHEST 07/27/2024 09:27:00 AM COMPARISON: 07/21/2024 CLINICAL HISTORY: Status post PICC central line placement FINDINGS: LINES, TUBES AND DEVICES: Right upper extremity PICC in place with tip in superior vena cava. LUNGS AND PLEURA: No focal pulmonary opacity. No pulmonary edema. No pleural effusion. No pneumothorax. HEART AND MEDIASTINUM: Stable cardiomegaly. BONES AND SOFT TISSUES: No acute osseous abnormality. IMPRESSION: 1. Right upper extremity PICC in place with tip in superior vena cava. 2. Stable cardiomegaly. Electronically signed by: Evalene Coho MD 07/27/2024 09:40 AM EDT RP Workstation: [id number]   US  EKG SITE RITE Result Date: 07/26/2024 If Site Rite image not attached, placement could not be confirmed due to current cardiac rhythm.  CARDIAC CATHETERIZATION Result Date: 07/26/2024 Images from the original result were not included. Coronary angiography 07/26/2024: LM: Normal LAD: Prox 40% disease Ramus: No significant disease Lcx:  Codominant. No significant disease RCA: Small to medium caliber, codominant.          Prox 80%, mid 70% calcific stenoses LVEDP 9 mmHg Right heart catheterization 07/26/2024: RA: 11 mmHg RV: 40/3 mmHg PA: 37/23 mmHg, mPAP 28 mmHg PCW: 16 mmHg AO sats: 93% PA sats: 46% CO: 3.1 L/min CI: 1.5 L/min/m2 PAPi 1. 2 Conclusion: Moderate to severe two vessel disease Compensated nonischemic cardiomyopathy with severe biventricular failure Recommendation: Continue GDMT for HFrEF Consider advanced heart failure team consult Ordered to start Eliquis tomorrow morning Manish JINNY Lawrence, MD  DG Abd 1 View Result Date: 07/24/2024 EXAM: 1 VIEW XRAY OF THE ABDOMEN 07/24/2024 11:38:06 PM COMPARISON: CT 07/21/2024 CLINICAL HISTORY: Abdominal pain, distention FINDINGS: BOWEL: Significant gaseous distension of the stomach and bowel loops which appear to predominantly reflect small bowel. Cannot exclude distal small bowel obstruction. SOFT TISSUES: No opaque urinary calculi. BONES: No acute osseous abnormality. IMPRESSION: 1. Gaseous distension of the stomach and predominantly small bowel concerning for distal small bowel obstruction. Electronically signed by: Franky Crease MD 07/24/2024 11:44 PM EDT RP Workstation: [id number]   ECHOCARDIOGRAM COMPLETE Result Date: 07/23/2024    ECHOCARDIOGRAM REPORT   Patient Name:   Benjamin Abbott Date of Exam: 07/23/2024 Medical Rec #:  [id number]      Height:       69.0 in Accession #:    [id number]     Weight:       199.7 lb Date of Birth:  [birth year]       BSA:          2.065 m Patient Age:    63 years       BP:           135/110 mmHg Patient Gender: M              HR:           94 bpm. Exam Location:  Inpatient Procedure: 2D Echo and Intracardiac Opacification Agent (Both Spectral and Color            Flow Doppler were utilized during procedure). Indications:    CHF  History:        Patient has no prior history of Echocardiogram examinations.                 CHF.  Sonographer:    Norleen Amour Referring Phys: BRIGIDA [facility]AU IMPRESSIONS  1. Left ventricular ejection fraction, by estimation, is 25 to 30%. The left ventricle has severely decreased function. The left ventricle demonstrates regional wall motion abnormalities (see scoring diagram/findings for description). The left ventricular internal cavity size was moderately dilated. There is mild left ventricular hypertrophy. Left ventricular diastolic parameters are indeterminate. LAD akinesis with no LV thrombus.  2. Right ventricular systolic function is normal. The right ventricular size is mildly enlarged. There is normal pulmonary artery systolic pressure.  3. Left atrial size was mildly dilated.  4. Right atrial size was moderately dilated.  5. The mitral valve is normal in structure. Moderate to severe mitral valve regurgitation. No evidence of mitral stenosis.  6. Tricuspid valve regurgitation is moderate to severe.  7. The aortic valve is tricuspid. Aortic valve regurgitation is trivial. No aortic stenosis is present.  8. Severely dilated pulmonary artery.  9. The inferior vena cava is normal in size with greater than 50% respiratory variability, suggesting right atrial pressure of 3 mmHg. Comparison(s): No prior Echocardiogram. Conclusion(s)/Recommendation(s): Reaching out to Dr. Bureau. FINDINGS  Left Ventricle: Left ventricular ejection fraction, by estimation, is 25 to 30%. The left ventricle has severely decreased function. The left ventricle demonstrates regional wall motion abnormalities. The left ventricular internal cavity size was moderately dilated. There is mild left ventricular hypertrophy. Left ventricular diastolic parameters are indeterminate.  LV Wall Scoring: The apical lateral segment, apical septal segment, apical anterior segment, and apical inferior segment are akinetic. The anterior wall, antero-lateral wall, anterior septum, inferior wall, posterior wall, mid inferoseptal segment, and basal inferoseptal segment are  hypokinetic. LAD akinesis with no LV thrombus. Right Ventricle: The right ventricular size is  mildly enlarged. No increase in right ventricular wall thickness. Right ventricular systolic function is normal. There is normal pulmonary artery systolic pressure. The tricuspid regurgitant velocity is 2.64  m/s, and with an assumed right atrial pressure of 3 mmHg, the estimated right ventricular systolic pressure is 30.9 mmHg. Left Atrium: Left atrial size was mildly dilated. Right Atrium: Right atrial size was moderately dilated. Pericardium: There is no evidence of pericardial effusion. The pericardial effusion is circumferential. Mitral Valve: The mitral valve is normal in structure. Moderate to severe mitral valve regurgitation. No evidence of mitral valve stenosis. Tricuspid Valve: The tricuspid valve is normal in structure. Tricuspid valve regurgitation is moderate to severe. No evidence of tricuspid stenosis. Aortic Valve: The aortic valve is tricuspid. Aortic valve regurgitation is trivial. No aortic stenosis is present. Aortic valve mean gradient measures 5.0 mmHg. Aortic valve peak gradient measures 9.1 mmHg. Aortic valve area, by VTI measures 1.07 cm. Pulmonic Valve: The pulmonic valve was normal in structure. Pulmonic valve regurgitation is mild to moderate. Aorta: The aortic root and ascending aorta are structurally normal, with no evidence of dilitation. Pulmonary Artery: The pulmonary artery is severely dilated. Venous: The inferior vena cava is normal in size with greater than 50% respiratory variability, suggesting right atrial pressure of 3 mmHg. IAS/Shunts: The atrial septum is grossly normal.  LEFT VENTRICLE PLAX 2D LVIDd:         6.20 cm      Diastology LVIDs:         5.30 cm      LV e' medial:    7.89 cm/s LV PW:         1.10 cm      LV E/e' medial:  9.6 LV IVS:        1.40 cm      LV e' lateral:   14.40 cm/s LVOT diam:     1.70 cm      LV E/e' lateral: 5.3 LV SV:         28 LV SV Index:   13 LVOT  Area:     2.27 cm  LV Volumes (MOD) LV vol d, MOD A2C: 141.0 ml LV vol d, MOD A4C: 178.0 ml LV vol s, MOD A2C: 119.0 ml LV vol s, MOD A4C: 116.0 ml LV SV MOD A2C:     22.0 ml LV SV MOD A4C:     178.0 ml LV SV MOD BP:      38.6 ml RIGHT VENTRICLE RV Basal diam:  4.30 cm RV S prime:     11.20 cm/s TAPSE (M-mode): 1.5 cm LEFT ATRIUM             Index        RIGHT ATRIUM           Index LA diam:        4.40 cm 2.13 cm/m   RA Area:     24.90 cm LA Vol (A2C):   66.1 ml 32.01 ml/m  RA Volume:   88.70 ml  42.96 ml/m LA Vol (A4C):   65.2 ml 31.58 ml/m LA Biplane Vol: 68.9 ml 33.37 ml/m  AORTIC VALVE                     PULMONIC VALVE AV Area (Vmax):    1.38 cm      PV Vmax:       0.58 m/s AV Area (Vmean):   1.24 cm      PV Peak  grad:  1.4 mmHg AV Area (VTI):     1.07 cm AV Vmax:           151.00 cm/s AV Vmean:          106.000 cm/s AV VTI:            0.260 m AV Peak Grad:      9.1 mmHg AV Mean Grad:      5.0 mmHg LVOT Vmax:         91.80 cm/s LVOT Vmean:        58.100 cm/s LVOT VTI:          0.122 m LVOT/AV VTI ratio: 0.47  AORTA Ao Root diam: 3.40 cm Ao Asc diam:  3.50 cm MV E velocity: 75.78 cm/s  TRICUSPID VALVE                            TR Peak grad:   27.9 mmHg                            TR Vmax:        264.00 cm/s                             SHUNTS                            Systemic VTI:  0.12 m                            Systemic Diam: 1.70 cm Stanly Leavens MD Electronically signed by Stanly Leavens MD Signature Date/Time: 07/23/2024/4:31:47 PM    Final    US  ASCITES (ABDOMEN LIMITED) Result Date: 07/22/2024 EXAM: LIMITED ABDOMINAL ULTRASOUND FOR ASCITES EVALUATION TECHNIQUE: Limited real-time sonography of all 4 quadrants of the abdomen was performed for evaluation of ascites. COMPARISON: CT abdomen pelvis 11/01/2021. CLINICAL HISTORY: Ascites. FINDINGS: RIGHT UPPER QUADRANT: At least small volume simple free fluid ascites. LEFT UPPER QUADRANT: No ascites seen. RIGHT LOWER QUADRANT: At  least small volume simple free fluid ascites. LEFT LOWER QUADRANT: No ascites seen. OTHER: Limited visualization of the rest of the abdomen demonstrates no acute abnormality. IMPRESSION: 1. Small-volume simple ascites in the right upper and right lower quadrants. Electronically signed by: Morgane Naveau MD 07/22/2024 09:36 PM EDT RP Workstation: [id number]   CT ABDOMEN PELVIS W CONTRAST Result Date: 07/21/2024 CLINICAL DATA:  Acute abdominal pain. Left lower quadrant pain. No intra atrial fibrillation. Abdominal swelling. EXAM: CT ABDOMEN AND PELVIS WITH CONTRAST TECHNIQUE: Multidetector CT imaging of the abdomen and pelvis was performed using the standard protocol following bolus administration of intravenous contrast. RADIATION DOSE REDUCTION: This exam was performed according to the departmental dose-optimization program which includes automated exposure control, adjustment of the mA and/or kV according to patient size and/or use of iterative reconstruction technique. CONTRAST:  OMNIPAQUE  IOHEXOL  300 MG/ML  SOLN COMPARISON:  Abdominopelvic CT 03/27/2016 FINDINGS: Lower chest: The heart is enlarged. Coronary artery calcifications. Small pericardial effusion. Small bilateral pleural effusions, right greater than left. Hepatobiliary: Nodular hepatic contours suggesting cirrhosis. No focal liver abnormality. There is mild periportal edema. Diffuse gallbladder wall thickening. No calcified gallstone or abnormal gallbladder distension. No biliary dilatation. Pancreas: No ductal dilatation or inflammation. Spleen: No splenomegaly. Splenic cleft posteriorly. No focal  abnormality. Adrenals/Urinary Tract: No adrenal nodule. No hydronephrosis or suspicious renal lesion. No renal stone. Partially distended urinary bladder. Stomach/Bowel: Nondistended stomach. No small bowel obstruction or inflammation. Right and transverse colectomy with anastomosis in the central abdomen. The descending colon is present in the  right abdomen. Multiple enteric sutures within the sigmoid colon. Left perianal and perineal scarring at site of previous fluid collection. Vascular/Lymphatic: Aortic atherosclerosis. No aortic aneurysm. 10 mm left external iliac node, chronic. No suspicious lymphadenopathy. Reproductive: Prostate is unremarkable. Other: Small to moderate abdominopelvic ascites. Generalized edema of the subcutaneous and intra-abdominal fat. Postsurgical change of the anterior abdominal wall. Small fat containing left inguinal hernia. Musculoskeletal: There are no acute or suspicious osseous abnormalities. IMPRESSION: 1. Nodular hepatic contours suggesting cirrhosis. Small to moderate abdominopelvic ascites. Generalized edema of the subcutaneous and intra-abdominal fat. 2. Diffuse gallbladder wall thickening, nonspecific in the setting of liver disease. No calcified gallstone or abnormal gallbladder distension. 3. Small bilateral pleural effusions, right greater than left. Small pericardial effusion. 4. Postsurgical change of the colon. No bowel obstruction or inflammation. Aortic Atherosclerosis (ICD10-I70.0). Electronically Signed   By: Andrea Gasman M.D.   On: 07/21/2024 16:01   DG Chest Port 1 View Result Date: 07/21/2024 EXAM: 1 VIEW(S) XRAY OF THE CHEST 07/21/2024 02:25:52 PM COMPARISON: None available. CLINICAL HISTORY: SOB (shortness of breath) [id number] FINDINGS: LUNGS AND PLEURA: No focal pulmonary opacity. No pulmonary edema. No pleural effusion. No pneumothorax. HEART AND MEDIASTINUM: Central venous congestion. Enlarged cardiac shadow. BONES AND SOFT TISSUES: No acute osseous abnormality. IMPRESSION: 1. Cardiomegaly and central venous congestion. 2. Cannot exclude pericardial effusion Electronically signed by: Norleen Boxer MD 07/21/2024 03:00 PM EDT RP Workstation: [id number]    DISCHARGE EXAMINATION: Vitals:   08/01/24 0757 08/01/24 0807 08/01/24 0825 08/01/24 1028  BP: 105/80 117/81 (!) 135/93   Pulse: 62 66  78 81  Resp: 14 18    Temp:   98.3 F (36.8 C)   TempSrc:   Oral   SpO2: 96% 97% 100%   Weight:      Height:       General appearance: Awake alert.  In no distress Resp: Clear to auscultation bilaterally.  Normal effort Cardio: S1-S2 is irregularly irregular GI: Abdomen is soft.  Nontender nondistended.  Bowel sounds are present normal.  No masses organomegaly Extremities: No edema.  Full range of motion of lower extremities. Neurologic: Alert and oriented x3.  No focal neurological deficits.    DISPOSITION: Home  Discharge Instructions     (HEART FAILURE PATIENTS) Call MD:  Anytime you have any of the following symptoms: 1) 3 pound weight gain in 24 hours or 5 pounds in 1 week 2) shortness of breath, with or without a dry hacking cough 3) swelling in the hands, feet or stomach 4) if you have to sleep on extra pillows at night in order to breathe.   Complete by: As directed    Avoid straining   Complete by: As directed    Call MD for:  difficulty breathing, headache or visual disturbances   Complete by: As directed    Call MD for:  extreme fatigue   Complete by: As directed    Call MD for:  hives   Complete by: As directed    Call MD for:  persistant dizziness or light-headedness   Complete by: As directed    Call MD for:  persistant nausea and vomiting   Complete by: As directed    Call MD for:  severe  uncontrolled pain   Complete by: As directed    Call MD for:  temperature >100.4   Complete by: As directed    Diet - low sodium heart healthy   Complete by: As directed    Heart Failure patients record your daily weight using the same scale at the same time of day   Complete by: As directed    Increase activity slowly   Complete by: As directed    STOP any activity that causes chest pain, shortness of breath, dizziness, sweating, or exessive weakness   Complete by: As directed          Allergies as of 08/01/2024       Reactions   Gadolinium Derivatives Shortness  Of Breath   Bronchospasms-Pt asthmatic and had albuterol  inhaler, which he used and it broke the spasms. Will need 13 hour prep for furture MRI lillie 06/17/24-AG,RN        Medication List     STOP taking these medications    amLODipine  10 MG tablet Commonly known as: NORVASC    celecoxib 200 MG capsule Commonly known as: CELEBREX   losartan  25 MG tablet Commonly known as: COZAAR    losartan  50 MG tablet Commonly known as: COZAAR    meloxicam  15 MG tablet Commonly known as: Mobic        TAKE these medications    albuterol  (2.5 MG/3ML) 0.083% nebulizer solution Commonly known as: PROVENTIL  Take 2.5 mg by nebulization every 6 (six) hours as needed for wheezing or shortness of breath.   albuterol  108 (90 Base) MCG/ACT inhaler Commonly known as: VENTOLIN  HFA Inhale 2 puffs into the lungs every 6 (six) hours as needed for wheezing or shortness of breath.   amiodarone 200 MG tablet Commonly known as: PACERONE Take 1 tablet (200 mg total) by mouth 2 (two) times daily.   cetirizine 10 MG tablet Commonly known as: ZYRTEC Take 10 mg by mouth daily.   cyclobenzaprine  10 MG tablet Commonly known as: FLEXERIL  Take 10 mg by mouth at bedtime as needed for muscle spasms.   digoxin 0.125 MG tablet Commonly known as: LANOXIN Take 1 tablet (0.125 mg total) by mouth daily.   Eliquis 5 MG Tabs tablet Generic drug: apixaban Take 1 tablet (5 mg total) by mouth 2 (two) times daily.   Entresto 24-26 MG Generic drug: sacubitril-valsartan Take 1 tablet by mouth 2 (two) times daily.   fluticasone  50 MCG/ACT nasal spray Commonly known as: FLONASE Place 1 spray into both nostrils daily as needed for allergies.   Fluticasone -Salmeterol 250-50 MCG/DOSE Aepb Commonly known as: ADVAIR Inhale 1 puff into the lungs 2 (two) times daily.   furosemide 40 MG tablet Commonly known as: LASIX Take 1 tablet (40 mg total) by mouth daily.   gabapentin  300 MG capsule Commonly known as:  Neurontin  Take 1 capsule (300 mg total) by mouth 3 (three) times daily.   hydrOXYzine 25 MG tablet Commonly known as: ATARAX Take 25 mg by mouth at bedtime. What changed: Another medication with the same name was removed. Continue taking this medication, and follow the directions you see here.   Jardiance 10 MG Tabs tablet Generic drug: empagliflozin Take 1 tablet (10 mg total) by mouth daily.   lidocaine  5 % Commonly known as: Lidoderm  Place 1 patch onto the skin daily. Remove & Discard patch within 12 hours or as directed by MD What changed:  when to take this reasons to take this   montelukast 10 MG tablet Commonly known as: SINGULAIR  Take 10 mg by mouth daily as needed (allergies).   ondansetron  4 MG tablet Commonly known as: Zofran  Take 1 tablet (4 mg total) by mouth every 8 (eight) hours as needed for nausea or vomiting. What changed: Another medication with the same name was removed. Continue taking this medication, and follow the directions you see here.   spironolactone 25 MG tablet Commonly known as: ALDACTONE Take 1 tablet (25 mg total) by mouth daily.   triamcinolone cream 0.1 % Commonly known as: KENALOG Apply 1 Application topically daily as needed (for rash).   Vitamin D 50 MCG (2000 UT) tablet Take 2,000 Units by mouth daily.          Follow-up Information     [facility]. Go in 7 day(s).   Specialty: Cardiology Why: Hospital follow up 08/02/2024 @ 9:45 am PLEASE bring a current medication list to appointment FREE valet parking, Entrance C, off [address] [facility] and [facility] entrance Contact information: [address] [place]  [phone]        Health, [address] Follow up.   Why: appt scheduled for 08/05/2024 2:20 pm  you will follow up with Candis Hocking NP, (your provider did not have earlier appt) Contact information: [address] [place] [address] [phone]                 TOTAL DISCHARGE TIME: 35 minutes  Rosanna Bickle Verdene  [facility] Pager on [email or web address]  08/02/2024, 10:46 AM

## 2024-08-01 NOTE — Anesthesia Postprocedure Evaluation (Signed)
 Anesthesia Post Note  Patient: Benjamin Abbott  Procedure(s) Performed: CARDIOVERSION     Patient location during evaluation: PACU Anesthesia Type: General Level of consciousness: awake and alert Pain management: pain level controlled Vital Signs Assessment: post-procedure vital signs reviewed and stable Respiratory status: spontaneous breathing, nonlabored ventilation and respiratory function stable Cardiovascular status: stable and blood pressure returned to baseline Anesthetic complications: no   No notable events documented.  Last Vitals:  Vitals:   08/01/24 0825 08/01/24 1028  BP: (!) 135/93   Pulse: 78 81  Resp:    Temp: 36.8 C   SpO2: 100%     Last Pain:  Vitals:   08/01/24 0825  TempSrc: Oral  PainSc: 7                  Debby FORBES Like

## 2024-08-01 NOTE — Progress Notes (Signed)
 Discharge   Patient expressed verbal understanding of discharge POC.   Patient given time to ask any questions.  Additional education included in AVS.  Alert oriented in good spirits.    PICC removed by IV team dressing CDI. Tele removed CCMD called in by [employer] (licensed conveyancer).  TOC meds ready Transport called for Lounge.  Family on the way. Discharge lounge notified.

## 2024-08-02 ENCOUNTER — Inpatient Hospital Stay (HOSPITAL_COMMUNITY)

## 2024-08-05 NOTE — Progress Notes (Incomplete)
 ADVANCED HF CLINIC CONSULT NOTE  Referring Physician: Leontine Cramp, NP Primary Care: Leontine Cramp, NP Primary Cardiologist: Darryle ONEIDA Decent, MD HF Cardiologist: Dr. Zenaida  HPI: Benjamin Abbott is a 63 y.o. male with history of  asthma, arthritis, and HTN. Newly diagnosed with HFrEF and persistent atrial fibrillation.  Admitted 10/25 to [facility] with new diagnosis heart failure and atrial fibrillation with RVR. He presented with bilateral lower extremity edema and abdominal pain, that started about a month ago. Echo showed EF 25-30%, mod to severe MR/TR. R/LHC showed severely reduced cardiac index and mildly elevated filling pressures, without significant CAD. Decision made not to start inotrope therapy given stable end organ perfusion, vitals and normal lactic acid. He underwent TEE/DCCV with ERAF on 07/29/24. He was loaded with amio and attempted DCCV again on 08/01/24 which unfortunately failed. GDMT was titrated and he was discharged at 85.1 kg  He presents today to establish care post hospitalization. Overall feeling ***. NYHA ***. Reports {Symptoms; cardiac:12860::dyspnea,fatigue}. Denies {Symptoms; cardiac:12860::chest pain,dyspnea,fatigue,near-syncope,orthopnea,palpitations,dizziness,abnormal bleeding}. Able to perform ADLs. Appetite okay. Weight at home ***. BP at home***. Compliant with all medications.   Past Medical History:  Diagnosis Date   Arthritis    Asthma    Back pain    Chronic pain    Colon cancer (HCC)    COPD (chronic obstructive pulmonary disease) (HCC)    Dyspnea    with exertion   GERD (gastroesophageal reflux disease)    Hypertension    Knee pain    Neuropathy    Obesity    Sleep apnea    cpap    Current Outpatient Medications  Medication Sig Dispense Refill   albuterol  (PROVENTIL  HFA;VENTOLIN  HFA) 108 (90 Base) MCG/ACT inhaler Inhale 2 puffs into the lungs every 6 (six) hours as needed for wheezing or shortness of breath. 18 g 1    albuterol  (PROVENTIL ) (2.5 MG/3ML) 0.083% nebulizer solution Take 2.5 mg by nebulization every 6 (six) hours as needed for wheezing or shortness of breath.     amiodarone (PACERONE) 200 MG tablet Take 1 tablet (200 mg total) by mouth 2 (two) times daily. 120 tablet 0   apixaban (ELIQUIS) 5 MG TABS tablet Take 1 tablet (5 mg total) by mouth 2 (two) times daily. 120 tablet 0   cetirizine (ZYRTEC) 10 MG tablet Take 10 mg by mouth daily.     Cholecalciferol (VITAMIN D) 50 MCG (2000 UT) tablet Take 2,000 Units by mouth daily.     cyclobenzaprine  (FLEXERIL ) 10 MG tablet Take 10 mg by mouth at bedtime as needed for muscle spasms.     digoxin (LANOXIN) 0.125 MG tablet Take 1 tablet (0.125 mg total) by mouth daily. 60 tablet 0   empagliflozin (JARDIANCE) 10 MG TABS tablet Take 1 tablet (10 mg total) by mouth daily. 60 tablet 0   fluticasone  (FLONASE) 50 MCG/ACT nasal spray Place 1 spray into both nostrils daily as needed for allergies.     Fluticasone -Salmeterol (ADVAIR) 250-50 MCG/DOSE AEPB Inhale 1 puff into the lungs 2 (two) times daily. 14 each 1   furosemide (LASIX) 40 MG tablet Take 1 tablet (40 mg total) by mouth daily. 60 tablet 0   gabapentin  (NEURONTIN ) 300 MG capsule Take 1 capsule (300 mg total) by mouth 3 (three) times daily. 90 capsule 0   hydrOXYzine (ATARAX) 25 MG tablet Take 25 mg by mouth at bedtime. (Patient not taking: Reported on 07/22/2024)     lidocaine  (LIDODERM ) 5 % Place 1 patch onto the skin daily.  Remove & Discard patch within 12 hours or as directed by MD (Patient taking differently: Place 1 patch onto the skin daily as needed (pain). Remove & Discard patch within 12 hours or as directed by MD) 10 patch 0   montelukast (SINGULAIR) 10 MG tablet Take 10 mg by mouth daily as needed (allergies).     ondansetron  (ZOFRAN ) 4 MG tablet Take 1 tablet (4 mg total) by mouth every 8 (eight) hours as needed for nausea or vomiting. 20 tablet 0   sacubitril-valsartan (ENTRESTO) 24-26 MG Take 1  tablet by mouth 2 (two) times daily. 120 tablet 0   spironolactone (ALDACTONE) 25 MG tablet Take 1 tablet (25 mg total) by mouth daily. 60 tablet 0   triamcinolone cream (KENALOG) 0.1 % Apply 1 Application topically daily as needed (for rash).     No current facility-administered medications for this visit.    Allergies  Allergen Reactions   Gadolinium Derivatives Shortness Of Breath    Bronchospasms-Pt asthmatic and had albuterol  inhaler, which he used and it broke the spasms. Will need 13 hour prep for furture MRI lillie 06/17/24-AG,RN      Social History   Socioeconomic History   Marital status: Single    Spouse name: Not on file   Number of children: 1   Years of education: Not on file   Highest education level: High school graduate  Occupational History   Occupation: Disabilty  Tobacco Use   Smoking status: Former    Current packs/day: 0.00    Average packs/day: 0.5 packs/day for 36.5 years (18.2 ttl pk-yrs)    Types: Cigarettes    Start date: 68    Quit date: 03/18/2020    Years since quitting: 4.3   Smokeless tobacco: Never  Vaping Use   Vaping status: Never Used  Substance and Sexual Activity   Alcohol use: Yes    Alcohol/week: 3.0 standard drinks of alcohol    Types: 3 Cans of beer per week    Comment: occ beer   Drug use: Yes    Types: Marijuana    Comment: daily   Sexual activity: Yes  Other Topics Concern   Not on file  Social History Narrative   Not on file   Social Drivers of Health   Financial Resource Strain: Low Risk  (07/24/2024)   Overall Financial Resource Strain (CARDIA)    Difficulty of Paying Living Expenses: Not very hard  Food Insecurity: No Food Insecurity (07/22/2024)   Hunger Vital Sign    Worried About Running Out of Food in the Last Year: Never true    Ran Out of Food in the Last Year: Never true  Transportation Needs: No Transportation Needs (07/22/2024)   PRAPARE - Administrator, Civil Service (Medical): No     Lack of Transportation (Non-Medical): No  Physical Activity: Not on file  Stress: Not on file  Social Connections: Unknown (11/08/2022)   [facility]   Social Network    Social Network: Not on file  Intimate Partner Violence: Not At Risk (07/22/2024)   Humiliation, Afraid, Rape, and Kick questionnaire    Fear of Current or Ex-Partner: No    Emotionally Abused: No    Physically Abused: No    Sexually Abused: No      Family History  Problem Relation Age of Onset   Heart attack Mother    Heart attack Father     There were no vitals filed for this visit.  There  were no vitals filed for this visit.  PHYSICAL EXAM: General: Well appearing. No distress on RA Cardiac: JVP ***. S1 and S2 present. No murmurs or rub. Resp: Lung sounds clear and equal B/L Abdomen: Soft, non-tender, non-distended.  Extremities: Warm and dry.  *** edema.  Neuro: Alert and oriented x3. Affect pleasant. Moves all extremities without difficulty.  ECG (personally reviewed): ***   ASSESSMENT & PLAN:  Chronic HFrEF: EF 25-30%, nl RV. 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  Suspect etiology related to tachyarrhythmia (AF), though heavy alcohol use could also be considered. Cath with severely reduced CI of 1.5, PAPi 1.2. His endorgan function remained stable. - NYHA ***. Volume *** - Continue Lasix 40 mg PO daily - Continue digoxin 0.125 daily.  - Continue Entresto 24/26 bid.  - Continue Jardiance 10 daily . - Continue spironolactone to 25 daily.  - Hold on BB   Persistent atrial fibrillation: HR 70-90s - 11/3  TEE/DCCV failed Back on Amiodarone.  - Reattempt cardioversion today 11/6 on IV amio -> failed - He is now rate controlled on amio and tolerating well - Can stop amio gtt switch to amio 200 bid  - Can go home on po amio 200 bid + Eliquis and reattempt DC-CV again in 1 month as outpatient - Consider eventual AF ablation.    Mitral  Regurgitation: Mod to severe on echo - suspect functional   Substance use: - Prior THC, cocaine - Heavy alcohol use   HTN - GDMT as above - BP improved   Lives alone. Does not drive. Will TOC CM to consult. Substance Abuse counseling.   Follow up in *** with ***  Callan Yontz, NP 08/05/24

## 2024-08-06 ENCOUNTER — Telehealth (HOSPITAL_COMMUNITY): Payer: Self-pay

## 2024-08-06 NOTE — Telephone Encounter (Signed)
 Called to confirm/remind patient of their appointment at [facility] on 08/07/24 11:15.   Appointment:   [x] Confirmed  [] Left mess   [] No answer/No voice mail  [] VM Full/unable to leave message  [] Phone not in service  Patient reminded to bring all medications and/or complete list.  Confirmed patient has transportation. Gave directions, instructed to utilize valet parking.

## 2024-08-07 ENCOUNTER — Observation Stay (HOSPITAL_COMMUNITY)

## 2024-08-07 ENCOUNTER — Encounter (HOSPITAL_COMMUNITY)
Admission: EM | Disposition: A | Discharge status: Home / Self Care | Payer: Self-pay | Source: Home / Self Care | Attending: Internal Medicine

## 2024-08-07 ENCOUNTER — Emergency Department (HOSPITAL_COMMUNITY)

## 2024-08-07 ENCOUNTER — Observation Stay (HOSPITAL_COMMUNITY): Admitting: Anesthesiology

## 2024-08-07 ENCOUNTER — Other Ambulatory Visit: Payer: Self-pay

## 2024-08-07 ENCOUNTER — Ambulatory Visit (HOSPITAL_COMMUNITY)

## 2024-08-07 ENCOUNTER — Inpatient Hospital Stay (HOSPITAL_COMMUNITY)
Admission: EM | Admit: 2024-08-07 | Discharge: 2024-08-13 | DRG: 394 | Disposition: A | Discharge status: Home / Self Care | Attending: Emergency Medicine | Admitting: Emergency Medicine

## 2024-08-07 ENCOUNTER — Encounter (HOSPITAL_COMMUNITY): Payer: Self-pay

## 2024-08-07 DIAGNOSIS — I5043 Acute on chronic combined systolic (congestive) and diastolic (congestive) heart failure: Secondary | ICD-10-CM

## 2024-08-07 DIAGNOSIS — Z800 Family history of malignant neoplasm of digestive organs: Secondary | ICD-10-CM

## 2024-08-07 DIAGNOSIS — K56600 Partial intestinal obstruction, unspecified as to cause: Principal | ICD-10-CM | POA: Diagnosis present

## 2024-08-07 DIAGNOSIS — I5022 Chronic systolic (congestive) heart failure: Secondary | ICD-10-CM | POA: Diagnosis not present

## 2024-08-07 DIAGNOSIS — N179 Acute kidney failure, unspecified: Secondary | ICD-10-CM | POA: Diagnosis not present

## 2024-08-07 DIAGNOSIS — N189 Chronic kidney disease, unspecified: Secondary | ICD-10-CM

## 2024-08-07 DIAGNOSIS — K469 Unspecified abdominal hernia without obstruction or gangrene: Secondary | ICD-10-CM | POA: Diagnosis present

## 2024-08-07 DIAGNOSIS — K5939 Other megacolon: Principal | ICD-10-CM | POA: Diagnosis present

## 2024-08-07 DIAGNOSIS — I130 Hypertensive heart and chronic kidney disease with heart failure and stage 1 through stage 4 chronic kidney disease, or unspecified chronic kidney disease: Secondary | ICD-10-CM | POA: Diagnosis present

## 2024-08-07 DIAGNOSIS — E876 Hypokalemia: Secondary | ICD-10-CM | POA: Diagnosis present

## 2024-08-07 DIAGNOSIS — J45909 Unspecified asthma, uncomplicated: Secondary | ICD-10-CM | POA: Diagnosis present

## 2024-08-07 DIAGNOSIS — Z91041 Radiographic dye allergy status: Secondary | ICD-10-CM

## 2024-08-07 DIAGNOSIS — Z9049 Acquired absence of other specified parts of digestive tract: Secondary | ICD-10-CM

## 2024-08-07 DIAGNOSIS — F1011 Alcohol abuse, in remission: Secondary | ICD-10-CM | POA: Diagnosis present

## 2024-08-07 DIAGNOSIS — I10 Essential (primary) hypertension: Secondary | ICD-10-CM | POA: Diagnosis present

## 2024-08-07 DIAGNOSIS — I110 Hypertensive heart disease with heart failure: Secondary | ICD-10-CM

## 2024-08-07 DIAGNOSIS — I4891 Unspecified atrial fibrillation: Secondary | ICD-10-CM

## 2024-08-07 DIAGNOSIS — G629 Polyneuropathy, unspecified: Secondary | ICD-10-CM | POA: Diagnosis present

## 2024-08-07 DIAGNOSIS — R933 Abnormal findings on diagnostic imaging of other parts of digestive tract: Secondary | ICD-10-CM

## 2024-08-07 DIAGNOSIS — Z860101 Personal history of adenomatous and serrated colon polyps: Secondary | ICD-10-CM

## 2024-08-07 DIAGNOSIS — K219 Gastro-esophageal reflux disease without esophagitis: Secondary | ICD-10-CM | POA: Diagnosis present

## 2024-08-07 DIAGNOSIS — I480 Paroxysmal atrial fibrillation: Secondary | ICD-10-CM | POA: Diagnosis present

## 2024-08-07 DIAGNOSIS — Z87891 Personal history of nicotine dependence: Secondary | ICD-10-CM | POA: Diagnosis not present

## 2024-08-07 DIAGNOSIS — E872 Acidosis, unspecified: Secondary | ICD-10-CM | POA: Diagnosis present

## 2024-08-07 DIAGNOSIS — Z7901 Long term (current) use of anticoagulants: Secondary | ICD-10-CM

## 2024-08-07 DIAGNOSIS — N1831 Chronic kidney disease, stage 3a: Secondary | ICD-10-CM | POA: Diagnosis present

## 2024-08-07 DIAGNOSIS — K7460 Unspecified cirrhosis of liver: Secondary | ICD-10-CM | POA: Diagnosis present

## 2024-08-07 DIAGNOSIS — Z7951 Long term (current) use of inhaled steroids: Secondary | ICD-10-CM

## 2024-08-07 DIAGNOSIS — Z79899 Other long term (current) drug therapy: Secondary | ICD-10-CM

## 2024-08-07 DIAGNOSIS — Z8249 Family history of ischemic heart disease and other diseases of the circulatory system: Secondary | ICD-10-CM

## 2024-08-07 DIAGNOSIS — J4489 Other specified chronic obstructive pulmonary disease: Secondary | ICD-10-CM | POA: Diagnosis present

## 2024-08-07 DIAGNOSIS — Z85038 Personal history of other malignant neoplasm of large intestine: Secondary | ICD-10-CM

## 2024-08-07 HISTORY — PX: FLEXIBLE SIGMOIDOSCOPY: SHX5431

## 2024-08-07 LAB — COMPREHENSIVE METABOLIC PANEL WITH GFR
ALT: 56 U/L — ABNORMAL HIGH (ref 0–44)
AST: 33 U/L (ref 15–41)
Albumin: 3.6 g/dL (ref 3.5–5.0)
Alkaline Phosphatase: 83 U/L (ref 38–126)
Anion gap: 13 (ref 5–15)
BUN: 33 mg/dL — ABNORMAL HIGH (ref 8–23)
CO2: 17 mmol/L — ABNORMAL LOW (ref 22–32)
Calcium: 9.3 mg/dL (ref 8.9–10.3)
Chloride: 105 mmol/L (ref 98–111)
Creatinine, Ser: 1.8 mg/dL — ABNORMAL HIGH (ref 0.61–1.24)
GFR, Estimated: 42 mL/min — ABNORMAL LOW (ref >60)
Glucose, Bld: 110 mg/dL — ABNORMAL HIGH (ref 70–99)
Potassium: 4.8 mmol/L (ref 3.5–5.1)
Sodium: 135 mmol/L (ref 135–145)
Total Bilirubin: 0.6 mg/dL (ref 0.0–1.2)
Total Protein: 7.5 g/dL (ref 6.5–8.1)

## 2024-08-07 LAB — URINALYSIS, ROUTINE W REFLEX MICROSCOPIC
Bilirubin Urine: NEGATIVE
Glucose, UA: 150 mg/dL — AB
Hgb urine dipstick: NEGATIVE
Ketones, ur: NEGATIVE mg/dL
Leukocytes,Ua: NEGATIVE
Nitrite: NEGATIVE
Protein, ur: NEGATIVE mg/dL
Specific Gravity, Urine: 1.012 (ref 1.005–1.030)
pH: 5 (ref 5.0–8.0)

## 2024-08-07 LAB — CBC
HCT: 47.8 % (ref 39.0–52.0)
Hemoglobin: 15.5 g/dL (ref 13.0–17.0)
MCH: 28.1 pg (ref 26.0–34.0)
MCHC: 32.4 g/dL (ref 30.0–36.0)
MCV: 86.6 fL (ref 80.0–100.0)
Platelets: 257 K/uL (ref 150–400)
RBC: 5.52 MIL/uL (ref 4.22–5.81)
RDW: 15.4 % (ref 11.5–15.5)
WBC: 8.9 K/uL (ref 4.0–10.5)
nRBC: 0 % (ref 0.0–0.2)

## 2024-08-07 LAB — APTT: aPTT: 44 s — ABNORMAL HIGH (ref 24–36)

## 2024-08-07 LAB — HEPARIN LEVEL (UNFRACTIONATED): Heparin Unfractionated: 0.39 [IU]/mL (ref 0.30–0.70)

## 2024-08-07 LAB — LACTIC ACID, PLASMA: Lactic Acid, Venous: 1 mmol/L (ref 0.5–1.9)

## 2024-08-07 LAB — TROPONIN I (HIGH SENSITIVITY)
Troponin I (High Sensitivity): 17 ng/L (ref <18)
Troponin I (High Sensitivity): 19 ng/L — ABNORMAL HIGH (ref <18)

## 2024-08-07 LAB — LIPASE, BLOOD: Lipase: 46 U/L (ref 11–51)

## 2024-08-07 LAB — BRAIN NATRIURETIC PEPTIDE: B Natriuretic Peptide: 64.7 pg/mL (ref 0.0–100.0)

## 2024-08-07 SURGERY — SIGMOIDOSCOPY, FLEXIBLE
Anesthesia: General

## 2024-08-07 MED ORDER — MORPHINE SULFATE (PF) 2 MG/ML IV SOLN
2.0000 mg | INTRAVENOUS | Status: DC | PRN
  Administered 2024-08-08: 2 mg via INTRAVENOUS
  Filled 2024-08-07: qty 1

## 2024-08-07 MED ORDER — SODIUM CHLORIDE 0.9 % IV BOLUS
500.0000 mL | Freq: Once | INTRAVENOUS | Status: AC
  Administered 2024-08-07: 500 mL via INTRAVENOUS

## 2024-08-07 MED ORDER — HYDROXYZINE HCL 25 MG PO TABS
25.0000 mg | ORAL_TABLET | Freq: Every day | ORAL | Status: DC
  Administered 2024-08-08 – 2024-08-12 (×5): 25 mg via ORAL
  Filled 2024-08-07 (×5): qty 1

## 2024-08-07 MED ORDER — EMPAGLIFLOZIN 10 MG PO TABS
10.0000 mg | ORAL_TABLET | Freq: Every day | ORAL | Status: DC
  Administered 2024-08-08 – 2024-08-13 (×6): 10 mg via ORAL
  Filled 2024-08-07 (×6): qty 1

## 2024-08-07 MED ORDER — LORAZEPAM 2 MG/ML IJ SOLN
0.5000 mg | Freq: Once | INTRAMUSCULAR | Status: AC | PRN
  Administered 2024-08-07: 0.5 mg via INTRAVENOUS
  Filled 2024-08-07: qty 1

## 2024-08-07 MED ORDER — SUCCINYLCHOLINE CHLORIDE 200 MG/10ML IV SOSY
PREFILLED_SYRINGE | INTRAVENOUS | Status: DC | PRN
  Administered 2024-08-07: 160 mg via INTRAVENOUS

## 2024-08-07 MED ORDER — DIGOXIN 125 MCG PO TABS
0.1250 mg | ORAL_TABLET | Freq: Every day | ORAL | Status: DC
  Administered 2024-08-08 – 2024-08-13 (×6): 0.125 mg via ORAL
  Filled 2024-08-07 (×6): qty 1

## 2024-08-07 MED ORDER — ACETAMINOPHEN 650 MG RE SUPP: 650.0000 mg | Freq: Four times a day (QID) | RECTAL | Status: DC | PRN

## 2024-08-07 MED ORDER — AMIODARONE HCL 200 MG PO TABS
200.0000 mg | ORAL_TABLET | Freq: Two times a day (BID) | ORAL | Status: DC
  Administered 2024-08-08 – 2024-08-13 (×11): 200 mg via ORAL
  Filled 2024-08-07 (×11): qty 1

## 2024-08-07 MED ORDER — ALBUTEROL SULFATE (2.5 MG/3ML) 0.083% IN NEBU: 2.5000 mg | INHALATION_SOLUTION | Freq: Four times a day (QID) | RESPIRATORY_TRACT | Status: DC | PRN

## 2024-08-07 MED ORDER — PHENOL 1.4 % MT LIQD
1.0000 | OROMUCOSAL | Status: DC | PRN
  Administered 2024-08-08 – 2024-08-12 (×2): 1 via OROMUCOSAL
  Filled 2024-08-07 (×2): qty 177

## 2024-08-07 MED ORDER — LABETALOL HCL 5 MG/ML IV SOLN
INTRAVENOUS | Status: AC
  Filled 2024-08-07: qty 4

## 2024-08-07 MED ORDER — FENTANYL CITRATE (PF) 100 MCG/2ML IJ SOLN
INTRAMUSCULAR | Status: AC
  Filled 2024-08-07: qty 2

## 2024-08-07 MED ORDER — OXYMETAZOLINE HCL 0.05 % NA SOLN
1.0000 | Freq: Once | NASAL | Status: AC | PRN
  Administered 2024-08-07: 1 via NASAL
  Filled 2024-08-07: qty 15

## 2024-08-07 MED ORDER — SODIUM CHLORIDE 0.9 % IV SOLN
INTRAVENOUS | Status: AC | PRN
Start: 2024-08-07 — End: 2024-08-07
  Administered 2024-08-07: 500 mL via INTRAVENOUS

## 2024-08-07 MED ORDER — ACETAMINOPHEN 325 MG PO TABS
650.0000 mg | ORAL_TABLET | Freq: Four times a day (QID) | ORAL | Status: DC | PRN
  Administered 2024-08-08: 650 mg via ORAL
  Filled 2024-08-07: qty 2

## 2024-08-07 MED ORDER — ONDANSETRON HCL 4 MG/2ML IJ SOLN
4.0000 mg | Freq: Four times a day (QID) | INTRAMUSCULAR | Status: DC | PRN
  Administered 2024-08-07: 4 mg via INTRAVENOUS
  Filled 2024-08-07 (×2): qty 2

## 2024-08-07 MED ORDER — PNEUMOCOCCAL 20-VAL CONJ VACC 0.5 ML IM SUSY
0.5000 mL | PREFILLED_SYRINGE | INTRAMUSCULAR | Status: DC
  Filled 2024-08-07: qty 0.5

## 2024-08-07 MED ORDER — MORPHINE SULFATE (PF) 4 MG/ML IV SOLN
4.0000 mg | Freq: Once | INTRAVENOUS | Status: AC
  Administered 2024-08-07: 4 mg via INTRAVENOUS
  Filled 2024-08-07: qty 1

## 2024-08-07 MED ORDER — HEPARIN (PORCINE) 25000 UT/250ML-% IV SOLN
1350.0000 [IU]/h | INTRAVENOUS | Status: DC
  Administered 2024-08-07 (×2): 1200 [IU]/h via INTRAVENOUS
  Administered 2024-08-08 – 2024-08-12 (×6): 1250 [IU]/h via INTRAVENOUS
  Filled 2024-08-07 (×7): qty 250

## 2024-08-07 MED ORDER — SODIUM CHLORIDE 0.9% FLUSH
3.0000 mL | Freq: Two times a day (BID) | INTRAVENOUS | Status: DC
  Administered 2024-08-08 – 2024-08-13 (×8): 3 mL via INTRAVENOUS

## 2024-08-07 MED ORDER — IOHEXOL 350 MG/ML SOLN
75.0000 mL | Freq: Once | INTRAVENOUS | Status: AC | PRN
  Administered 2024-08-07: 75 mL via INTRAVENOUS

## 2024-08-07 MED ORDER — SODIUM CHLORIDE 0.9 % IV SOLN: INTRAVENOUS | Status: DC

## 2024-08-07 MED ORDER — LIDOCAINE HCL URETHRAL/MUCOSAL 2 % EX GEL
1.0000 | Freq: Once | CUTANEOUS | Status: AC
  Administered 2024-08-07: 1
  Filled 2024-08-07: qty 11

## 2024-08-07 MED ORDER — BENZOCAINE 20 % MT AERO
INHALATION_SPRAY | Freq: Once | OROMUCOSAL | Status: AC
  Administered 2024-08-07: 1 via OROMUCOSAL
  Filled 2024-08-07: qty 57

## 2024-08-07 MED ORDER — GABAPENTIN 300 MG PO CAPS
300.0000 mg | ORAL_CAPSULE | Freq: Three times a day (TID) | ORAL | Status: DC
  Administered 2024-08-08 – 2024-08-13 (×16): 300 mg via ORAL
  Filled 2024-08-07 (×16): qty 1

## 2024-08-07 MED ORDER — FENTANYL CITRATE (PF) 100 MCG/2ML IJ SOLN
INTRAMUSCULAR | Status: DC | PRN
  Administered 2024-08-07: 50 ug via INTRAVENOUS

## 2024-08-07 MED ORDER — ETOMIDATE 2 MG/ML IV SOLN
INTRAVENOUS | Status: DC | PRN
  Administered 2024-08-07: 20 mg via INTRAVENOUS

## 2024-08-07 MED ORDER — ESMOLOL HCL 100 MG/10ML IV SOLN
INTRAVENOUS | Status: DC | PRN
  Administered 2024-08-07: 40 mg via INTRAVENOUS

## 2024-08-07 MED ORDER — LIDOCAINE 2% (20 MG/ML) 5 ML SYRINGE
INTRAMUSCULAR | Status: DC | PRN
  Administered 2024-08-07: 40 mg via INTRAVENOUS

## 2024-08-07 NOTE — Consult Note (Signed)
 Benjamin Abbott 11/27/1960  [id number].    Requesting MD: Aleck Sheer, PA-C Chief Complaint/Reason for Consult: possible bowel obstruction   HPI: Benjamin Abbott is a 63 y.o. male who presented to the ED with abdominal pain that started yesterday evening around 8-8:30 PM. Patient reports he had dinner around 5-6 and had roast beef, rice and veggies and was feeling fine until he started having some nausea and vomited his food. He then noted generalized abdominal pain that progressively worsened. He presented to the ED for evaluation. He did have a large formed stool in the ED prior to CT. Denies bloody or dark stools. Prior abdominal surgery includes open right hemicolectomy for colon cancer with diverting ileostomy and then later ileostomy reversal. This was done around 1999 in [place]. He has not had a colonoscopy recently but has had at least one since his colon surgery with [facility]. He denies issues with constipation at baseline and reports he is usually fairly regular. He was just discharged from the hospital with new CHF and A.Fib s/p cardioversion on 11/6 with EF 25-30% prior to that. He was discharged on Eliquis and last dose of this was last night. PMH otherwise significant for COPD, Hx of alcohol and tobacco abuse, hepatic cirrhosis, HTN. He does not currently drink alcohol or use tobacco. He lives independently and has a nephew, Starlett, and a friend, Meade, who help him if needed. He denies any known medication allergies, he has listed high sensitivity to gadolinium derivatives from September of this year.    ROS: Negative other than HPI  Family History  Problem Relation Age of Onset   Heart attack Mother    Heart attack Father     Past Medical History:  Diagnosis Date   Arthritis    Asthma    Back pain    Chronic pain    Colon cancer (HCC)    COPD (chronic obstructive pulmonary disease) (HCC)    Dyspnea    with exertion   GERD (gastroesophageal reflux disease)     Hypertension    Knee pain    Neuropathy    Obesity    Sleep apnea    cpap    Past Surgical History:  Procedure Laterality Date   ARTHROSCOPY WITH ANTERIOR CRUCIATE LIGAMENT (ACL) REPAIR WITH ANTERIOR TIBILIAS GRAFT Right    BOWEL RESECTION     CARDIOVERSION N/A 07/29/2024   Procedure: CARDIOVERSION;  Surgeon: Cherrie Toribio SAUNDERS, MD;  Location: [facility];  Service: Cardiovascular;  Laterality: N/A;   CARDIOVERSION N/A 08/01/2024   Procedure: CARDIOVERSION;  Surgeon: Cherrie Toribio SAUNDERS, MD;  Location: [facility];  Service: Cardiovascular;  Laterality: N/A;   ESOPHAGOGASTRODUODENOSCOPY N/A 07/26/2024   Procedure: EGD (ESOPHAGOGASTRODUODENOSCOPY);  Surgeon: Rosalie Kitchens, MD;  Location: [facility] END[facility]COPY;  Service: Gastroenterology;  Laterality: N/A;   KNEE ARTHROSCOPY WITH LATERAL MENISECTOMY Left 06/03/2022   Procedure: KNEE ARTHROSCOPY WITH PARTIAL  LATERAL MENISECTOMY;  Surgeon: Sharl Selinda Dover, MD;  Location: [facility];  Service: Orthopedics;  Laterality: Left;   RIGHT/LEFT HEART CATH AND CORONARY ANGIOGRAPHY N/A 07/26/2024   Procedure: RIGHT/LEFT HEART CATH AND CORONARY ANGIOGRAPHY;  Surgeon: Elmira Newman PARAS, MD;  Location: [facility];  Service: Cardiovascular;  Laterality: N/A;   TRANSESOPHAGEAL ECHOCARDIOGRAM (CATH LAB) N/A 07/29/2024   Procedure: TRANSESOPHAGEAL ECHOCARDIOGRAM;  Surgeon: Cherrie Toribio SAUNDERS, MD;  Location: [facility];  Service: Cardiovascular;  Laterality: N/A;    Social History:  reports that he quit smoking about  4 years ago. His smoking use included cigarettes. He started smoking about 40 years ago. He has a 18.2 pack-year smoking history. He has never used smokeless tobacco. He reports that he does not currently use alcohol after a past usage of about 3.0 standard drinks of alcohol per week. He reports that he does not currently use drugs after having used the following drugs: Marijuana.  Allergies:  Allergies  Allergen Reactions    Gadolinium Derivatives Shortness Of Breath    Bronchospasms-Pt asthmatic and had albuterol  inhaler, which he used and it broke the spasms. 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,RN    (Not in a hospital admission)    Physical Exam: Blood pressure 120/63, pulse 60, temperature 98 F (36.7 C), resp. rate 13, height 5' 9 (1.753 m), weight 85.1 kg, SpO2 99%. General: pleasant, WD/WN male who is laying in bed in NAD HEENT: head is normocephalic, atraumatic.  Sclera are non-icteric. Ears and nose without any obvious masses or lesions.  Mouth is pink and moist. Dentition fair Heart: regular, rate, and rhythm. Palpable radial pulses bilaterally  Lungs: CTAB, no wheezes, rhonchi, or rales noted.  Respiratory effort nonlabored Abd:  Soft, mild distention, mild generalized TTP without peritonitis or guarding, midline and RLQ surgical scars well healed MS: no BUE or BLE edema Skin: warm and dry  Psych: A&Ox4 with an appropriate affect Neuro: normal speech, thought process intact, moves all extremities, gait not assessed   Results for orders placed or performed during the hospital encounter of 08/07/24 (from the past 48 hours)  Lipase, blood     Status: None   Collection Time: 08/07/24  3:06 AM  Result Value Ref Range   Lipase 46 11 - 51 U/L    Comment: Performed at [facility], [address], [place], [place] [address]  Comprehensive metabolic panel     Status: Abnormal   Collection Time: 08/07/24  3:06 AM  Result Value Ref Range   Sodium 135 135 - 145 mmol/L   Potassium 4.8 3.5 - 5.1 mmol/L   Chloride 105 98 - 111 mmol/L   CO2 17 (L) 22 - 32 mmol/L   Glucose, Bld 110 (H) 70 - 99 mg/dL    Comment: Glucose reference range applies only to samples taken after fasting for at least 8 hours.   BUN 33 (H) 8 - 23 mg/dL   Creatinine, Ser 8.19 (H) 0.61 - 1.24 mg/dL   Calcium 9.3 8.9 - 89.6 mg/dL   Total Protein 7.5 6.5 - 8.1 g/dL   Albumin 3.6 3.5 - 5.0 g/dL   AST  33 15 - 41 U/L   ALT 56 (H) 0 - 44 U/L   Alkaline Phosphatase 83 38 - 126 U/L   Total Bilirubin 0.6 0.0 - 1.2 mg/dL   GFR, Estimated 42 (L) >60 mL/min    Comment: (NOTE) Calculated using the CKD-EPI Creatinine Equation (2021)    Anion gap 13 5 - 15    Comment: Performed at [facility], [address] [place], [place] [address]  CBC     Status: None   Collection Time: 08/07/24  3:06 AM  Result Value Ref Range   WBC 8.9 4.0 - 10.5 K/uL   RBC 5.52 4.22 - 5.81 MIL/uL   Hemoglobin 15.5 13.0 - 17.0 g/dL   HCT 52.1 60.9 - 47.9 %   MCV 86.6 80.0 - 100.0 fL   MCH 28.1 26.0 - 34.0 pg   MCHC  32.4 30.0 - 36.0 g/dL   RDW 84.5 88.4 - 84.4 %   Platelets 257 150 - 400 K/uL   nRBC 0.0 0.0 - 0.2 %    Comment: Performed at [facility], [address], [place], [place] [address]  Brain natriuretic peptide     Status: None   Collection Time: 08/07/24  7:22 AM  Result Value Ref Range   B Natriuretic Peptide 64.7 0.0 - 100.0 pg/mL    Comment: Performed at [facility], [address] [place], [place] [address]  Troponin I (High Sensitivity)     Status: None   Collection Time: 08/07/24  7:34 AM  Result Value Ref Range   Troponin I (High Sensitivity) 17 <18 ng/L    Comment: (NOTE) Elevated high sensitivity troponin I (hsTnI) values and significant  changes across serial measurements may suggest ACS but many other  chronic and acute conditions are known to elevate hsTnI results.  Refer to the Links section for chest pain algorithms and additional  guidance. Performed at [facility], [address] [place], [place] [address]   Urinalysis, Routine w reflex microscopic -Urine, Clean Catch     Status: Abnormal   Collection Time: 08/07/24  8:35 AM  Result Value Ref Range   Color, Urine STRAW (A) YELLOW   APPearance CLEAR CLEAR   Specific Gravity, Urine 1.012 1.005 - 1.030   pH 5.0 5.0 - 8.0   Glucose, UA 150 (A) NEGATIVE mg/dL   Hgb urine dipstick NEGATIVE NEGATIVE    Bilirubin Urine NEGATIVE NEGATIVE   Ketones, ur NEGATIVE NEGATIVE mg/dL   Protein, ur NEGATIVE NEGATIVE mg/dL   Nitrite NEGATIVE NEGATIVE   Leukocytes,Ua NEGATIVE NEGATIVE    Comment: Performed at [facility], [address], [place], [place] [address]  Troponin I (High Sensitivity)     Status: Abnormal   Collection Time: 08/07/24  9:13 AM  Result Value Ref Range   Troponin I (High Sensitivity) 19 (H) <18 ng/L    Comment: (NOTE) Elevated high sensitivity troponin I (hsTnI) values and significant  changes across serial measurements may suggest ACS but many other  chronic and acute conditions are known to elevate hsTnI results.  Refer to the Links section for chest pain algorithms and additional  guidance. Performed at [facility], [address] [place], [place] [address]    CT ABDOMEN PELVIS W CONTRAST Result Date: 08/07/2024 CLINICAL DATA:  Suspect bowel obstruction. Generalized abdominal pain with emesis and constipation. EXAM: CT ABDOMEN AND PELVIS WITH CONTRAST TECHNIQUE: Multidetector CT imaging of the abdomen and pelvis was performed using the standard protocol following bolus administration of intravenous contrast. RADIATION DOSE REDUCTION: This exam was performed according to the departmental dose-optimization program which includes automated exposure control, adjustment of the mA and/or kV according to patient size and/or use of iterative reconstruction technique. CONTRAST:  75mL OMNIPAQUE  IOHEXOL  350 MG/ML SOLN COMPARISON:  07/21/2024 FINDINGS: Lower chest: Mild stable cardiomegaly. Visualized lung bases are clear. Hepatobiliary: Gallbladder is contracted. Minimal low attenuation of the liver compatible with a degree of steatosis without focal mass. Biliary tree is unremarkable. Pancreas: Normal. Spleen: Normal. Adrenals/Urinary Tract: Adrenal glands are normal. Kidneys are normal in size without hydronephrosis or nephrolithiasis. Ureters and bladder are normal.  Stomach/Bowel: Stomach mildly distended with ingested contents. Proximal to mid small bowel is within normal. Several suture lines over the rectosigmoid colon. Moderate fecal retention over the rectosigmoid colon. Findings suggest subtotal colectomy. Sigmoid colon  appears to extend into the right abdomen where there is a relative area of narrowing with associated twisting of the mesentery in the right abdomen just right of midline. The relative area of narrowing then becomes more prominent where there is another suture line over this bowel loop in the anterior mid abdomen as this measures approximately 13.3 cm in diameter and is unchanged compared to the prior exam. It is difficult to follow the bowel loops through this area with there is twisting of the mesentery. There is likely a degree of partial obstruction through this area of marrow bowel in the right abdomen although it is not completely obstructive. There is no evidence of free fluid, focal inflammatory change or pneumatosis. Vascular/Lymphatic: Minimal plaque over the abdominal aorta which is normal caliber. Remaining vascular structures are unremarkable. No adenopathy. Reproductive: Prostate is unremarkable. Other: None. Musculoskeletal: No focal abnormality. IMPRESSION: 1. Multiple surgical changes of the bowel at least reflecting subtotal colectomy with several suture lines over the rectosigmoid colon. Sigmoid colon extends into the right abdomen where there is a relative area of narrowing with associated twisting of the mesentery. The relative narrowing then becomes dilated in the anterior mid abdomen measuring 13.3 cm in diameter as this is unchanged and likely represent a degree of partial obstruction. Recommend serial follow-up imaging. 2. Mild hepatic steatosis. 3. Aortic atherosclerosis. Aortic Atherosclerosis (ICD10-I70.0). Electronically Signed   By: Toribio Agreste M.D.   On: 08/07/2024 09:12   DG Chest Portable 1 View Result Date:  08/07/2024 CLINICAL DATA:  Shortness of breath. EXAM: PORTABLE CHEST 1 VIEW COMPARISON:  07/27/2024 FINDINGS: The cardio pericardial silhouette is enlarged. The lungs are clear without focal pneumonia, edema, pneumothorax or pleural effusion. No acute bony abnormality. Interval movable of right PICC line. Telemetry leads overlie the chest. IMPRESSION: Enlargement of the cardiopericardial silhouette. No acute cardiopulmonary findings. Electronically Signed   By: Camellia Candle M.D.   On: 08/07/2024 07:50    Anti-infectives (From admission, onward)    None       Assessment/Plan This is a 63 y.o. male with a hx HTN, COPD, OSA, CHF (EF 25-30%), A. Fib s/p cardioversion (11/3 and 11/6) on Eliquis (last dose 11/11 PM)  w/ of prior R colectomy and diverting ileostomy followed by ileostomy takedown who presented with abdominal pain nausea and vomiting. CT with question of prior colonic resection and sigmoid colon extending to right abdomen where there is a relative are of narrowing with associated mesenteric twisting, dilated colon up to 13 cm with concern for PSBO. On my review and my attending's I have concern for possible sigmoid volvulus. Pt is without peritonitis and is HD stable. Recommend GI consult for possible flexible sigmoidoscopy and decompression. Being admitted to medical service, will need cardiology/heart failure to see as well and will need to discuss whether patient is a candidate for sigmoid colectomy if this is truly a sigmoid volvulus.    FEN - NPO, IVF per admitting VTE - Last dose eliquis 11/11 PM, recommend holding and may need reversal  ID - No current abx, afebrile and no leukocytosis  Foley -  Dispo - Admit to internal medicine.   - per admitting -  CHFrEF - TEE 11/3 with EF <20%, ECHO 10/28 with EF 25-30% A fib on eliquis - s/p cardioversion 11/6 COPD HTN Hx of alcohol abuse, now sober Hx of tobacco abuse Hx of right sided colon cancer s/p resection in the  1990s Hepatic cirrhosis   I reviewed ED provider notes,  last 24 h vitals and pain scores, last 48 h intake and output, last 24 h labs and trends, last 24 h imaging results, and notes from recent hospitalization.  This care required high  level of medical decision making.   Burnard JONELLE Louder, [facility] 08/07/2024, 10:20 AM Please see Amion for pager number during day hours 7:00am-4:30pm

## 2024-08-07 NOTE — ED Notes (Signed)
 Received a call from ENDO to pause heparin drip for procedure.

## 2024-08-07 NOTE — Progress Notes (Signed)
 PHARMACY - ANTICOAGULATION CONSULT NOTE  Pharmacy Consult for heparin  Indication: atrial fibrillation  Allergies  Allergen Reactions   Gadolinium Derivatives Shortness Of Breath    Bronchospasms-Pt asthmatic and had albuterol  inhaler, which he used and it broke the spasms. 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,RN    Patient Measurements: Height: 5' 9 (175.3 cm) Weight: 85.1 kg (187 lb 9.8 oz) IBW/kg (Calculated) : 70.7 HEPARIN DW (KG): 85.1  Vital Signs: Temp: 98.1 F (36.7 C) (11/12 1931) Temp Source: Oral (11/12 1751) BP: 88/64 (11/12 1931) Pulse Rate: 70 (11/12 1931)  Labs: Recent Labs    08/07/24 0306 08/07/24 0734 08/07/24 0913 08/07/24 1956  HGB 15.5  --   --   --   HCT 47.8  --   --   --   PLT 257  --   --   --   APTT  --   --   --  44*  HEPARINUNFRC  --   --   --  0.39  CREATININE 1.80*  --   --   --   TROPONINIHS  --  17 19*  --     Estimated Creatinine Clearance: 46.6 mL/min (A) (by C-G formula based on SCr of 1.8 mg/dL (H)).   Medical History: Past Medical History:  Diagnosis Date   Arthritis    Asthma    Back pain    Chronic pain    Colon cancer (HCC)    COPD (chronic obstructive pulmonary disease) (HCC)    Dyspnea    with exertion   GERD (gastroesophageal reflux disease)    Hypertension    Knee pain    Neuropathy    Obesity    Sleep apnea    cpap   Assessment: Patient presented with CC of abdominal pain and emesis. Found to have potential SBO and possible sigmoid volvulus. Potential for sigmoid colectomy. Known hx of Afib on Eliquis PTA, last dose 11/11 @ noon. Pharmacy consulted to dose heparin gtt in peri-op time period.   Heparin level 0.39, lower end of range aPTT 44, subtherapeutic  No overt s/sx of bleeding or issues with infusion noted. Heparin running at 1200 units/hr.  Goal of Therapy:  Heparin level 0.3-0.7 units/ml aPTT 66-102 seconds Monitor platelets by anticoagulation protocol: Yes   Plan:   Increase heparin infusion to 1300 units/hr Will transition to heparin levels for monitoring F/u daily heparin level, cbc and s/sx of bleeding F/u plans to transition back to eliquis  Thank you for allowing pharmacy to participate in this patient's care.  Leonor GORMAN Bash, PharmD Emergency Medicine Clinical Pharmacist 08/07/2024,9:00 PM

## 2024-08-07 NOTE — ED Notes (Signed)
 Xray complete. Bed ready. Preparing to transport to floor. Pt tolerating well.

## 2024-08-07 NOTE — Anesthesia Postprocedure Evaluation (Signed)
 Anesthesia Post Note  Patient: Benjamin Abbott  Procedure(s) Performed: [facility]     Patient location during evaluation: Endoscopy Anesthesia Type: General Level of consciousness: awake and alert, patient cooperative and oriented Pain management: pain level controlled Vital Signs Assessment: post-procedure vital signs reviewed and stable Respiratory status: spontaneous breathing, nonlabored ventilation, respiratory function stable and patient connected to nasal cannula oxygen Cardiovascular status: blood pressure returned to baseline and stable Postop Assessment: no apparent nausea or vomiting Anesthetic complications: no Comments: Pt required Labetalol for BP and HR control post op    There were no known notable events for this encounter.  Last Vitals:  Vitals:   08/07/24 1505 08/07/24 1510  BP: (!) 180/115   Pulse: 97 (!) 108  Resp: 20 20  Temp:    SpO2: 97% 97%    Last Pain:  Vitals:   08/07/24 1458  TempSrc:   PainSc: 0-No pain                 [name],E. Barth Trella

## 2024-08-07 NOTE — ED Notes (Signed)
Breathing is even and unlabored.

## 2024-08-07 NOTE — ED Notes (Signed)
 Patient transported to CT

## 2024-08-07 NOTE — Interval H&P Note (Signed)
 History and Physical Interval Note: 63 year old male with possible sigmoid volvulus for sigmoidoscopy with propofol .  08/07/2024 2:01 PM  Benjamin Abbott  has presented today for sigmoidoscopy with propofol , with the diagnosis of sigmoid volvulus.  The various methods of treatment have been discussed with the patient and family. After consideration of risks, benefits and other options for treatment, the patient has consented to  Procedure(s): SIGMOIDOSCOPY, FLEXIBLE (N/A) as a surgical intervention.  The patient's history has been reviewed, patient examined, no change in status, stable for surgery.  I have reviewed the patient's chart and labs.  Questions were answered to the patient's satisfaction.     Estelita Manas

## 2024-08-07 NOTE — ED Notes (Signed)
 Arrives back to ED from ENDO. Alert, NAD, calm, interactive, resps e/u, speaking clearly. Denies pain. Endorses slight nausea. In and out of afib and NSR. HR 71. Heparin restarted at 12units/hr.

## 2024-08-07 NOTE — Anesthesia Preprocedure Evaluation (Addendum)
 Anesthesia Evaluation  Patient identified by MRN, date of birth, ID band Patient awake    Reviewed: Allergy & Precautions, NPO status , Patient's Chart, lab work & pertinent test results  History of Anesthesia Complications Negative for: history of anesthetic complications  Airway Mallampati: I  TM Distance: >3 FB Neck ROM: Full    Dental  (+) Dental Advisory Given   Pulmonary sleep apnea and Continuous Positive Airway Pressure Ventilation , COPD,  COPD inhaler, former smoker   breath sounds clear to auscultation       Cardiovascular hypertension, pulmonary hypertension+CHF (Entresto)  + dysrhythmias Atrial Fibrillation  Rhythm:Irregular Rate:Tachycardia  07/29/2024 TEE: EF <20%.  1. The LV has severely decreased function,global hypokinesis. The left ventricular internal cavity size was severely dilated.   2. RVF is severely reduced. The right ventricular size is normal.   3. Left atrial size was moderately dilated. No left atrial/left atrial appendage thrombus was detected.   4. There is no evidence of cardiac tamponade.   5. The mitral valve is normal in structure. Mild mitral valve regurgitation.   6. The aortic valve is tricuspid. Aortic valve regurgitation is mild.     Neuro/Psych negative neurological ROS     GI/Hepatic ,,,(+)     substance abuse  cocaine useN/v: r/o volvulus   Endo/Other  negative endocrine ROS    Renal/GU Renal InsufficiencyRenal disease     Musculoskeletal   Abdominal   Peds  Hematology Hb 15.5, plt 257k Eliquis yesterday   Anesthesia Other Findings   Reproductive/Obstetrics                              Anesthesia Physical Anesthesia Plan  ASA: 4  Anesthesia Plan: General   Post-op Pain Management: Minimal or no pain anticipated   Induction: Intravenous and Rapid sequence  PONV Risk Score and Plan: 2 and Ondansetron  and Treatment may vary due to age  or medical condition  Airway Management Planned: Oral ETT  Additional Equipment:   Intra-op Plan:   Post-operative Plan: Extubation in OR  Informed Consent: I have reviewed the patients History and Physical, chart, labs and discussed the procedure including the risks, benefits and alternatives for the proposed anesthesia with the patient or authorized representative who has indicated his/her understanding and acceptance.     Dental advisory given  Plan Discussed with: CRNA and Surgeon  Anesthesia Plan Comments:          Anesthesia Quick Evaluation

## 2024-08-07 NOTE — Consult Note (Signed)
 [facility] Gastroenterology Consult  Referring Provider: No ref. provider found Primary Care Physician:  Leontine Cramp, NP Primary Gastroenterologist: Dr.Kenna Kirn  Reason for Consultation: Possible sigmoid volvulus  HPI: Benjamin Abbott is a 63 y.o. male who was in his usual state of health until yesterday evening when after eating dinner he developed sudden onset of nausea, vomiting and generalized abdominal pain.  He reports having a bowel movement around 2 AM in the ED, reports it was small in amount and abdominal pain has improved slightly with pain medication in the ER..   Prior GI workup: Colonoscopy 2021, personal history of colon cancer(in the 30s requiring surgery in 1990s in [place] ), family history of colon cancer(mother at age 1): Colocolonic anastomosis in rectosigmoid area, at 20 cm and ileocolonic anastomosis noted in right colon, 3 tubular adenomas removed from descending colon, repeat colonoscopy recommended in 3 years EGD, 2021, dysphagia, early satiety, bloating: Biopsies negative for H. pylori, celiac and EOE She was supposed to have a repeat colonoscopy for surveillance in 12/2023, but states that he has been hospitalized multiple times and has not been able to complete his colonoscopy for surveillance. EGD 07/26/2024, cirrhosis noted on CAT scan, rule out esophageal varices: Normal esophagus, normal stomach, normal duodenum  He has a past medical history of CHF, A-fib, status post cardioversion with EF 25 to 30%, COPD.  Past Medical History:  Diagnosis Date   Arthritis    Asthma    Back pain    Chronic pain    Colon cancer (HCC)    COPD (chronic obstructive pulmonary disease) (HCC)    Dyspnea    with exertion   GERD (gastroesophageal reflux disease)    Hypertension    Knee pain    Neuropathy    Obesity    Sleep apnea    cpap    Past Surgical History:  Procedure Laterality Date   ARTHROSCOPY WITH ANTERIOR CRUCIATE LIGAMENT (ACL) REPAIR WITH ANTERIOR  TIBILIAS GRAFT Right    BOWEL RESECTION     CARDIOVERSION N/A 07/29/2024   Procedure: CARDIOVERSION;  Surgeon: Cherrie Toribio SAUNDERS, MD;  Location: [facility] INVASIVE CV LAB;  Service: Cardiovascular;  Laterality: N/A;   CARDIOVERSION N/A 08/01/2024   Procedure: CARDIOVERSION;  Surgeon: Cherrie Toribio SAUNDERS, MD;  Location: [facility] INVASIVE CV LAB;  Service: Cardiovascular;  Laterality: N/A;   ESOPHAGOGASTRODUODENOSCOPY N/A 07/26/2024   Procedure: EGD (ESOPHAGOGASTRODUODENOSCOPY);  Surgeon: Rosalie Kitchens, MD;  Location: [facility] ENDOSCOPY;  Service: Gastroenterology;  Laterality: N/A;   KNEE ARTHROSCOPY WITH LATERAL MENISECTOMY Left 06/03/2022   Procedure: KNEE ARTHROSCOPY WITH PARTIAL  LATERAL MENISECTOMY;  Surgeon: Sharl Selinda Dover, MD;  Location: [facility] ORS;  Service: Orthopedics;  Laterality: Left;   RIGHT/LEFT HEART CATH AND CORONARY ANGIOGRAPHY N/A 07/26/2024   Procedure: RIGHT/LEFT HEART CATH AND CORONARY ANGIOGRAPHY;  Surgeon: Elmira Newman PARAS, MD;  Location: [facility] INVASIVE CV LAB;  Service: Cardiovascular;  Laterality: N/A;   TRANSESOPHAGEAL ECHOCARDIOGRAM (CATH LAB) N/A 07/29/2024   Procedure: TRANSESOPHAGEAL ECHOCARDIOGRAM;  Surgeon: Cherrie Toribio SAUNDERS, MD;  Location: [facility] INVASIVE CV LAB;  Service: Cardiovascular;  Laterality: N/A;    Prior to Admission medications   Medication Sig Start Date End Date Taking? Authorizing Provider  albuterol  (PROVENTIL  HFA;VENTOLIN  HFA) 108 (90 Base) MCG/ACT inhaler Inhale 2 puffs into the lungs every 6 (six) hours as needed for wheezing or shortness of breath. 02/28/18  Yes Diallo, Abdoulaye, MD  albuterol  (PROVENTIL ) (2.5 MG/3ML) 0.083% nebulizer solution Take 2.5 mg by nebulization every 6 (six) hours as needed for wheezing or  shortness of breath.   Yes [provider]  amiodarone (PACERONE) 200 MG tablet Take 1 tablet (200 mg total) by mouth 2 (two) times daily. 08/01/24  Yes Krishnan, Gokul, MD  apixaban (ELIQUIS) 5 MG TABS tablet Take 1 tablet (5 mg total) by mouth  2 (two) times daily. 08/01/24  Yes Krishnan, Gokul, MD  cetirizine (ZYRTEC) 10 MG tablet Take 10 mg by mouth daily.   Yes [provider]  Cholecalciferol (VITAMIN D) 50 MCG (2000 UT) tablet Take 2,000 Units by mouth daily.   Yes [provider]  cyclobenzaprine  (FLEXERIL ) 10 MG tablet Take 10 mg by mouth at bedtime as needed for muscle spasms.   Yes [provider]  digoxin (LANOXIN) 0.125 MG tablet Take 1 tablet (0.125 mg total) by mouth daily. 08/01/24  Yes Krishnan, Gokul, MD  empagliflozin (JARDIANCE) 10 MG TABS tablet Take 1 tablet (10 mg total) by mouth daily. 08/01/24  Yes Verdene Purchase, MD  fluticasone  [facility]) 50 MCG/ACT nasal spray Place 1 spray into both nostrils daily as needed for allergies. 02/09/22  Yes [provider]  Fluticasone -Salmeterol (ADVAIR) 250-50 MCG/DOSE AEPB Inhale 1 puff into the lungs 2 (two) times daily. 02/28/18  Yes Diallo, Abdoulaye, MD  furosemide (LASIX) 40 MG tablet Take 1 tablet (40 mg total) by mouth daily. 08/01/24  Yes Krishnan, Gokul, MD  gabapentin  (NEURONTIN ) 300 MG capsule Take 1 capsule (300 mg total) by mouth 3 (three) times daily. 06/08/17  Yes Odis Burnard Jansky, PA-C  hydrOXYzine (ATARAX) 25 MG tablet Take 25 mg by mouth at bedtime.   Yes [provider]  montelukast (SINGULAIR) 10 MG tablet Take 10 mg by mouth daily as needed (allergies).   Yes [provider]  ondansetron  (ZOFRAN ) 4 MG tablet Take 1 tablet (4 mg total) by mouth every 8 (eight) hours as needed for nausea or vomiting. 06/03/22  Yes Sharl Selinda Dover, MD  sacubitril-valsartan (ENTRESTO) 24-26 MG Take 1 tablet by mouth 2 (two) times daily. 08/01/24  Yes Krishnan, Gokul, MD  spironolactone (ALDACTONE) 25 MG tablet Take 1 tablet (25 mg total) by mouth daily. 08/01/24  Yes Krishnan, Gokul, MD  triamcinolone cream (KENALOG) 0.1 % Apply 1 Application topically daily as needed (for rash). 04/07/22  Yes [provider]    Current  Facility-Administered Medications  Medication Dose Route Frequency Provider Last Rate Last Admin   acetaminophen  (TYLENOL ) tablet 650 mg  650 mg Oral Q6H PRN Smith, Rondell A, MD       Or   acetaminophen  (TYLENOL ) suppository 650 mg  650 mg Rectal Q6H PRN Smith, Rondell A, MD       albuterol  (PROVENTIL ) (2.5 MG/3ML) 0.083% nebulizer solution 2.5 mg  2.5 mg Nebulization Q6H PRN Claudene, Rondell A, MD       heparin ADULT infusion 100 units/mL (25000 units/250mL)  1,200 Units/hr Intravenous Continuous Tanda Powell ORN, RPH 12 mL/hr at 08/07/24 1126 1,200 Units/hr at 08/07/24 1126   morphine (PF) 2 MG/ML injection 2 mg  2 mg Intravenous Q2H PRN Smith, Rondell A, MD       ondansetron  (ZOFRAN ) injection 4 mg  4 mg Intravenous Q6H PRN Smith, Rondell A, MD       [START ON 08/08/2024] pneumococcal 20-valent conjugate vaccine (PREVNAR 20) injection 0.5 mL  0.5 mL Intramuscular Tomorrow-1000 Smith, Rondell A, MD       sodium chloride  flush (NS) 0.9 % injection 3 mL  3 mL Intravenous Q12H Claudene Maximino LABOR, MD  Current Outpatient Medications  Medication Sig Dispense Refill   albuterol  (PROVENTIL  HFA;VENTOLIN  HFA) 108 (90 Base) MCG/ACT inhaler Inhale 2 puffs into the lungs every 6 (six) hours as needed for wheezing or shortness of breath. 18 g 1   albuterol  (PROVENTIL ) (2.5 MG/3ML) 0.083% nebulizer solution Take 2.5 mg by nebulization every 6 (six) hours as needed for wheezing or shortness of breath.     amiodarone (PACERONE) 200 MG tablet Take 1 tablet (200 mg total) by mouth 2 (two) times daily. 120 tablet 0   apixaban (ELIQUIS) 5 MG TABS tablet Take 1 tablet (5 mg total) by mouth 2 (two) times daily. 120 tablet 0   cetirizine (ZYRTEC) 10 MG tablet Take 10 mg by mouth daily.     Cholecalciferol (VITAMIN D) 50 MCG (2000 UT) tablet Take 2,000 Units by mouth daily.     cyclobenzaprine  (FLEXERIL ) 10 MG tablet Take 10 mg by mouth at bedtime as needed for muscle spasms.     digoxin (LANOXIN) 0.125 MG tablet  Take 1 tablet (0.125 mg total) by mouth daily. 60 tablet 0   empagliflozin (JARDIANCE) 10 MG TABS tablet Take 1 tablet (10 mg total) by mouth daily. 60 tablet 0   fluticasone  (FLONASE) 50 MCG/ACT nasal spray Place 1 spray into both nostrils daily as needed for allergies.     Fluticasone -Salmeterol (ADVAIR) 250-50 MCG/DOSE AEPB Inhale 1 puff into the lungs 2 (two) times daily. 14 each 1   furosemide (LASIX) 40 MG tablet Take 1 tablet (40 mg total) by mouth daily. 60 tablet 0   gabapentin  (NEURONTIN ) 300 MG capsule Take 1 capsule (300 mg total) by mouth 3 (three) times daily. 90 capsule 0   hydrOXYzine (ATARAX) 25 MG tablet Take 25 mg by mouth at bedtime.     montelukast (SINGULAIR) 10 MG tablet Take 10 mg by mouth daily as needed (allergies).     ondansetron  (ZOFRAN ) 4 MG tablet Take 1 tablet (4 mg total) by mouth every 8 (eight) hours as needed for nausea or vomiting. 20 tablet 0   sacubitril-valsartan (ENTRESTO) 24-26 MG Take 1 tablet by mouth 2 (two) times daily. 120 tablet 0   spironolactone (ALDACTONE) 25 MG tablet Take 1 tablet (25 mg total) by mouth daily. 60 tablet 0   triamcinolone cream (KENALOG) 0.1 % Apply 1 Application topically daily as needed (for rash).      Allergies as of 08/07/2024 - Review Complete 08/07/2024  Allergen Reaction Noted   Gadolinium derivatives Shortness Of Breath 06/17/2024    Family History  Problem Relation Age of Onset   Heart attack Mother    Heart attack Father     Social History   Socioeconomic History   Marital status: Single    Spouse name: Not on file   Number of children: 1   Years of education: Not on file   Highest education level: High school graduate  Occupational History   Occupation: Disabilty  Tobacco Use   Smoking status: Former    Current packs/day: 0.00    Average packs/day: 0.5 packs/day for 36.5 years (18.2 ttl pk-yrs)    Types: Cigarettes    Start date: 75    Quit date: 03/18/2020    Years since quitting: 4.3    Smokeless tobacco: Never  Vaping Use   Vaping status: Never Used  Substance and Sexual Activity   Alcohol use: Not Currently    Alcohol/week: 3.0 standard drinks of alcohol    Types: 3 Cans of beer per week  Comment: occ beer   Drug use: Not Currently    Types: Marijuana    Comment: daily   Sexual activity: Yes  Other Topics Concern   Not on file  Social History Narrative   Not on file   Social Drivers of Health   Financial Resource Strain: Low Risk  (07/24/2024)   Overall Financial Resource Strain (CARDIA)    Difficulty of Paying Living Expenses: Not very hard  Food Insecurity: No Food Insecurity (08/07/2024)   Hunger Vital Sign    Worried About Running Out of Food in the Last Year: Never true    Ran Out of Food in the Last Year: Never true  Transportation Needs: No Transportation Needs (08/07/2024)   PRAPARE - Administrator, Civil Service (Medical): No    Lack of Transportation (Non-Medical): No  Physical Activity: Not on file  Stress: Not on file  Social Connections: Unknown (11/08/2022)   Received from [facility]   Social Network    Social Network: Not on file  Intimate Partner Violence: Not At Risk (08/07/2024)   Humiliation, Afraid, Rape, and Kick questionnaire    Fear of Current or Ex-Partner: No    Emotionally Abused: No    Physically Abused: No    Sexually Abused: No    Review of Systems: As per HPI  Physical Exam: Vital signs in last 24 hours: Temp:  [97.9 F (36.6 C)-98.7 F (37.1 C)] 98.7 F (37.1 C) (11/12 1130) Pulse Rate:  [54-83] 59 (11/12 1200) Resp:  [13-21] 21 (11/12 1200) BP: (104-151)/(63-132) 130/86 (11/12 1200) SpO2:  [98 %-100 %] 98 % (11/12 1200) Weight:  [85.1 kg] 85.1 kg (11/12 0730)    General:   Alert,  Well-developed, well-nourished, pleasant and cooperative in NAD Head:  Normocephalic and atraumatic. Eyes:  Sclera clear, no icterus.   Conjunctiva pink. Ears:  Normal auditory acuity. Nose:  No deformity,  discharge,  or lesions. Mouth:  No deformity or lesions.  Oropharynx pink & moist. Neck:  Supple; no masses or thyromegaly. Lungs:  Clear throughout to auscultation.   No wheezes, crackles, or rhonchi. No acute distress. Heart:  Regular rate and rhythm; no murmurs, clicks, rubs,  or gallops. Extremities:  Without clubbing or edema. Neurologic:  Alert and  oriented x4;  grossly normal neurologically. Skin:  Intact without significant lesions or rashes. Psych:  Alert and cooperative. Normal mood and affect. Abdomen: Distended, sluggish bowel sounds, mild generalized tenderness         Lab Results: Recent Labs    08/07/24 0306  WBC 8.9  HGB 15.5  HCT 47.8  PLT 257   BMET Recent Labs    08/07/24 0306  NA 135  K 4.8  CL 105  CO2 17*  GLUCOSE 110*  BUN 33*  CREATININE 1.80*  CALCIUM 9.3   LFT Recent Labs    08/07/24 0306  PROT 7.5  ALBUMIN 3.6  AST 33  ALT 56*  ALKPHOS 83  BILITOT 0.6   PT/INR No results for input(s): LABPROT, INR in the last 72 hours.  Studies/Results: CT ABDOMEN PELVIS W CONTRAST Addendum Date: 08/07/2024 ADDENDUM REPORT: 08/07/2024 12:07 ADDENDUM: Above findings discussed with Dr. Saintclair as we discussed possibility of sigmoid volvulus. Electronically Signed   By: Toribio Agreste M.D.   On: 08/07/2024 12:07   Result Date: 08/07/2024 CLINICAL DATA:  Suspect bowel obstruction. Generalized abdominal pain with emesis and constipation. EXAM: CT ABDOMEN AND PELVIS WITH CONTRAST TECHNIQUE: Multidetector CT imaging of the abdomen  and pelvis was performed using the standard protocol following bolus administration of intravenous contrast. RADIATION DOSE REDUCTION: This exam was performed according to the departmental dose-optimization program which includes automated exposure control, adjustment of the mA and/or kV according to patient size and/or use of iterative reconstruction technique. CONTRAST:  75mL OMNIPAQUE  IOHEXOL  350 MG/ML SOLN COMPARISON:   07/21/2024 FINDINGS: Lower chest: Mild stable cardiomegaly. Visualized lung bases are clear. Hepatobiliary: Gallbladder is contracted. Minimal low attenuation of the liver compatible with a degree of steatosis without focal mass. Biliary tree is unremarkable. Pancreas: Normal. Spleen: Normal. Adrenals/Urinary Tract: Adrenal glands are normal. Kidneys are normal in size without hydronephrosis or nephrolithiasis. Ureters and bladder are normal. Stomach/Bowel: Stomach mildly distended with ingested contents. Proximal to mid small bowel is within normal. Several suture lines over the rectosigmoid colon. Moderate fecal retention over the rectosigmoid colon. Findings suggest subtotal colectomy. Sigmoid colon appears to extend into the right abdomen where there is a relative area of narrowing with associated twisting of the mesentery in the right abdomen just right of midline. The relative area of narrowing then becomes more prominent where there is another suture line over this bowel loop in the anterior mid abdomen as this measures approximately 13.3 cm in diameter and is unchanged compared to the prior exam. It is difficult to follow the bowel loops through this area with there is twisting of the mesentery. There is likely a degree of partial obstruction through this area of marrow bowel in the right abdomen although it is not completely obstructive. There is no evidence of free fluid, focal inflammatory change or pneumatosis. Vascular/Lymphatic: Minimal plaque over the abdominal aorta which is normal caliber. Remaining vascular structures are unremarkable. No adenopathy. Reproductive: Prostate is unremarkable. Other: None. Musculoskeletal: No focal abnormality. IMPRESSION: 1. Multiple surgical changes of the bowel at least reflecting subtotal colectomy with several suture lines over the rectosigmoid colon. Sigmoid colon extends into the right abdomen where there is a relative area of narrowing with associated twisting  of the mesentery. The relative narrowing then becomes dilated in the anterior mid abdomen measuring 13.3 cm in diameter as this is unchanged and likely represent a degree of partial obstruction. Recommend serial follow-up imaging. 2. Mild hepatic steatosis. 3. Aortic atherosclerosis. Aortic Atherosclerosis (ICD10-I70.0). Electronically Signed: By: Toribio Agreste M.D. On: 08/07/2024 09:12   DG Chest Portable 1 View Result Date: 08/07/2024 CLINICAL DATA:  Shortness of breath. EXAM: PORTABLE CHEST 1 VIEW COMPARISON:  07/27/2024 FINDINGS: The cardio pericardial silhouette is enlarged. The lungs are clear without focal pneumonia, edema, pneumothorax or pleural effusion. No acute bony abnormality. Interval movable of right PICC line. Telemetry leads overlie the chest. IMPRESSION: Enlargement of the cardiopericardial silhouette. No acute cardiopulmonary findings. Electronically Signed   By: Camellia Candle M.D.   On: 08/07/2024 07:50    Impression: Possible sigmoid volvulus History of colon cancer, status post right hemicolectomy and diverting ileostomy with reversal and ileocolonic anastomosis Discussed CAT scan findings with radiologist: Difficult to ascertain whether patient has true sigmoid volvulus versus distended small bowel  Lab abnormalities: Acidosis, bicarb 17 Renal impairment, BUN 33, creatinine 1.8, GFR 42 Elevated ALT 56  CT from 10/25 showed nodular hepatic contour suggesting cirrhosis, moderate ascites, generalized edema of subcutaneous and intra-abdominal fat, small bilateral pleural effusions  Was on heparin until an hour ago Last dose of Eliquis was yesterday  Plan: Emergent sigmoidoscopy in an attempt to decompress sigmoid volvulus. Discussed the risks and the benefits with the patient in details. He  understands and verbalizes consent.   LOS: 0 days   Estelita Manas, MD  08/07/2024, 12:42 PM

## 2024-08-07 NOTE — ED Notes (Signed)
 Attempted NG tube. Pt grabbed this RN's arm and pulled arm down. Pt now refusing NG tube second attempt. Surgeon at bedside and aware of pt refusal.

## 2024-08-07 NOTE — Anesthesia Procedure Notes (Signed)
 Procedure Name: Intubation Date/Time: 08/07/2024 2:27 PM  Performed by: Elby Raelene SAUNDERS, CRNAPre-anesthesia Checklist: Patient identified, Emergency Drugs available, Suction available and Patient being monitored Patient Re-evaluated:Patient Re-evaluated prior to induction Oxygen Delivery Method: Circle System Utilized Preoxygenation: Pre-oxygenation with 100% oxygen Induction Type: IV induction Ventilation: Mask ventilation without difficulty Laryngoscope Size: Mac and 4 Grade View: Grade I Tube type: Oral Tube size: 7.5 mm Number of attempts: 1 Airway Equipment and Method: Stylet and Oral airway Placement Confirmation: ETT inserted through vocal cords under direct vision, positive ETCO2 and breath sounds checked- equal and bilateral Secured at: 23 cm Tube secured with: Tape Dental Injury: Teeth and Oropharynx as per pre-operative assessment

## 2024-08-07 NOTE — ED Notes (Signed)
 Spoke with endo, they are coming to get pt around 1400. PT updated at this time.

## 2024-08-07 NOTE — Progress Notes (Signed)
 PHARMACY - ANTICOAGULATION CONSULT NOTE  Pharmacy Consult for heparin  Indication: atrial fibrillation  Allergies  Allergen Reactions   Gadolinium Derivatives Shortness Of Breath    Bronchospasms-Pt asthmatic and had albuterol  inhaler, which he used and it broke the spasms. 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,RN    Patient Measurements: Height: 5' 9 (175.3 cm) Weight: 85.1 kg (187 lb 9.8 oz) IBW/kg (Calculated) : 70.7 HEPARIN DW (KG): 85.1  Vital Signs: Temp: 98 F (36.7 C) (11/12 0722) BP: 120/63 (11/12 0930) Pulse Rate: 60 (11/12 0930)  Labs: Recent Labs    08/07/24 0306 08/07/24 0734 08/07/24 0913  HGB 15.5  --   --   HCT 47.8  --   --   PLT 257  --   --   CREATININE 1.80*  --   --   TROPONINIHS  --  17 19*    Estimated Creatinine Clearance: 46.6 mL/min (A) (by C-G formula based on SCr of 1.8 mg/dL (H)).   Medical History: Past Medical History:  Diagnosis Date   Arthritis    Asthma    Back pain    Chronic pain    Colon cancer (HCC)    COPD (chronic obstructive pulmonary disease) (HCC)    Dyspnea    with exertion   GERD (gastroesophageal reflux disease)    Hypertension    Knee pain    Neuropathy    Obesity    Sleep apnea    cpap   Assessment: Patient presented with CC of abdominal pain and emesis. Found to have potential SBO and possible sigmoid volvulus. Potential for sigmoid colectomy. Known hx of Afib on Eliquis PTA, last dose 11/11 @ noon. HgB 15.5 and PLTs 257.   Pharmacy consulted to dose heparin gtt in peri-op time period.   Goal of Therapy:  Heparin level 0.3-0.7 units/ml aPTT 66-102 seconds Monitor platelets by anticoagulation protocol: Yes   Plan:  Start heparin infusion at 1200 units/hr Check anti-Xa level in 8 hours and daily while on heparin Continue to monitor H&H and platelets  Powell Blush, PharmD, BCCCP  08/07/2024,10:45 AM

## 2024-08-07 NOTE — ED Notes (Signed)
 Pt alert, NAD, calm, interactive. C/o nausea, denies pain.

## 2024-08-07 NOTE — Op Note (Signed)
 [facility] Patient Name: Benjamin Abbott Procedure Date : 08/07/2024 MRN: [id number] Attending MD: Estelita Manas , MD, [id number] Date of Birth: 08-24-1961 CSN: [id number] Age: 63 Admit Type: Outpatient Procedure:                Flexible Sigmoidoscopy Indications:              Abnormal CT of the GI tract, suspected sigmoid                            volvulus Providers:                Estelita Manas, MD, Haskel Chris, Technician,                            Clotilda Schmitz, RN Referring MD:             [facility], Surgical team Medicines:                Monitored Anesthesia Care Complications:            No immediate complications. Estimated Blood Loss:     Estimated blood loss: none. Procedure:                Pre-Anesthesia Assessment:                           - Prior to the procedure, a History and Physical                            was performed, and patient medications and                            allergies were reviewed. The patient's tolerance of                            previous anesthesia was also reviewed. The risks                            and benefits of the procedure and the sedation                            options and risks were discussed with the patient.                            All questions were answered, and informed consent                            was obtained. Prior Anticoagulants: The patient has                            taken Eliquis (apixaban), last dose was 1 day prior                            to procedure. ASA Grade Assessment: IV - A patient  with severe systemic disease that is a constant                            threat to life. After reviewing the risks and                            benefits, the patient was deemed in satisfactory                            condition to undergo the procedure.                           After obtaining informed consent, the scope was                             passed under direct vision. The PCF-HQ190L                            ([id number] Olympus colonoscope was introduced                            through the anus and advanced to the the sigmoid                            colon. The flexible sigmoidoscopy was unusually                            difficult due to poor bowel prep. The patient                            tolerated the procedure well. The quality of the                            bowel preparation was poor. Scope In: 2:31:36 PM Scope Out: 2:36:12 PM Total Procedure Duration: 0 hours 4 minutes 36 seconds  Findings:      The perianal and digital rectal examinations were normal.      The lumen of the rectum, recto-sigmoid colon and sigmoid colon was       moderately dilated. Decompression performed and post decompression       abdomen was soft.      Extensive amounts of solid stool was found in the rectum, in the       recto-sigmoid colon, in the sigmoid colon and in the descending colon,       precluding visualization. Impression:               - Preparation of the colon was poor.                           - Dilated in the rectum, in the recto-sigmoid colon                            and in the sigmoid colon.                           -  Stool in the rectum, in the recto-sigmoid colon,                            in the sigmoid colon and in the descending colon.                           - No specimens collected. Moderate Sedation:      Patient did not receive moderate sedation for this procedure, but       instead received monitored anesthesia care. Recommendation:           - Abdominal Xray now.                           - NPO. Procedure Code(s):        --- Professional ---                           [phone], Sigmoidoscopy, flexible; diagnostic,                            including collection of specimen(s) by brushing or                            washing, when performed (separate procedure) Diagnosis Code(s):        ---  Professional ---                           K59.39, Other megacolon                           R93.3, Abnormal findings on diagnostic imaging of                            other parts of digestive tract CPT copyright 2022 [employer]. All rights reserved. The codes documented in this report are preliminary and upon coder review may  be revised to meet current compliance requirements. Estelita Manas, MD 08/07/2024 2:45:17 PM This report has been signed electronically. Number of Addenda: 0

## 2024-08-07 NOTE — H&P (Addendum)
 History and Physical    Patient: Benjamin Abbott FMW:[id number] DOB: 1960-12-23 DOA: 08/07/2024 DOS: the patient was seen and examined on 08/07/2024 PCP: Leontine Cramp, NP  Patient coming from: Home  Chief Complaint:  Chief Complaint  Patient presents with   Abdominal Pain   Hypertension   HPI: Benjamin Abbott is a 63 y.o. male with medical history significant of hypertension, paroxysmal atrial fibrillation, COPD, colon cancer s/p resection, alcohol abuse, and tobacco abuse presents with nausea, vomiting, and abdominal pain.  Symptoms began yesterday after 8 PM, initially as an upset stomach that progressed to cramping and diffuse abdominal pain described as 'hurting all over'. He has had difficulty with bowel movements, having only a partial bowel movement yesterday and a small one today. Vomiting occurred twice yesterday, and nausea medication did not alleviate the symptoms. Lying down exacerbates the symptoms, causing a feeling of being smothered and increasing the urge to vomit.  He has a history of heart failure, recently diagnosed during a previous hospital visit for breathing issues. New medications were started for this condition, but he has not taken them today due to being in the hospital.  He has a significant surgical history, including a resection of the large intestine for colon cancer in 1999. A colonoscopy was recently scheduled but not completed due to hospitalization for breathing issues.  In terms of social history, he quit smoking and alcohol consumption approximately six to seven months ago. During the review of symptoms, he denies any recent alcohol or tobacco use. He confirms having had prior surgeries on his stomach and reports a history of colon cancer surgery.  In the emergency department patient was noted to be afebrile with stable vital signs.  Labs significant for CO2 17, BUN 33, creatinine 1.8, anion gap 13, AST 33, ALT 56, and total bilirubin 0.6.  Chest x-ray noted  enlargement of the cardiopericardial silhouette without acute abnormality.  CT scan of the abdomen pelvis noted multiple surgical changes in the bowel at least reflecting subtotal colectomy with narrowing and subsequent dilation in the anterior mid abdomen concerning for partial bowel obstruction.  General surgery has been consulted and recommended placement of NG tube.  There was concern for the possibility of a volvulus for which gastroenterology had been consulted.  Review of Systems: As mentioned in the history of present illness. All other systems reviewed and are negative. Past Medical History:  Diagnosis Date   Arthritis    Asthma    Back pain    Chronic pain    Colon cancer (HCC)    COPD (chronic obstructive pulmonary disease) (HCC)    Dyspnea    with exertion   GERD (gastroesophageal reflux disease)    Hypertension    Knee pain    Neuropathy    Obesity    Sleep apnea    cpap   Past Surgical History:  Procedure Laterality Date   ARTHROSCOPY WITH ANTERIOR CRUCIATE LIGAMENT (ACL) REPAIR WITH ANTERIOR TIBILIAS GRAFT Right    BOWEL RESECTION     CARDIOVERSION N/A 07/29/2024   Procedure: CARDIOVERSION;  Surgeon: Cherrie Toribio SAUNDERS, MD;  Location: [facility];  Service: Cardiovascular;  Laterality: N/A;   CARDIOVERSION N/A 08/01/2024   Procedure: CARDIOVERSION;  Surgeon: Cherrie Toribio SAUNDERS, MD;  Location: [facility];  Service: Cardiovascular;  Laterality: N/A;   ESOPHAGOGASTRODUODENOSCOPY N/A 07/26/2024   Procedure: EGD (ESOPHAGOGASTRODUODENOSCOPY);  Surgeon: Rosalie Kitchens, MD;  Location: [facility] ENDOSCOPY;  Service: Gastroenterology;  Laterality: N/A;   KNEE ARTHROSCOPY WITH  LATERAL MENISECTOMY Left 06/03/2022   Procedure: KNEE ARTHROSCOPY WITH PARTIAL  LATERAL MENISECTOMY;  Surgeon: Sharl Selinda Dover, MD;  Location: [facility] ORS;  Service: Orthopedics;  Laterality: Left;   RIGHT/LEFT HEART CATH AND CORONARY ANGIOGRAPHY N/A 07/26/2024   Procedure: RIGHT/LEFT HEART CATH AND  CORONARY ANGIOGRAPHY;  Surgeon: Elmira Newman PARAS, MD;  Location: [facility];  Service: Cardiovascular;  Laterality: N/A;   TRANSESOPHAGEAL ECHOCARDIOGRAM (CATH LAB) N/A 07/29/2024   Procedure: TRANSESOPHAGEAL ECHOCARDIOGRAM;  Surgeon: Cherrie Toribio SAUNDERS, MD;  Location: [facility];  Service: Cardiovascular;  Laterality: N/A;   Social History:  reports that he quit smoking about 4 years ago. His smoking use included cigarettes. He started smoking about 40 years ago. He has a 18.2 pack-year smoking history. He has never used smokeless tobacco. He reports that he does not currently use alcohol after a past usage of about 3.0 standard drinks of alcohol per week. He reports that he does not currently use drugs after having used the following drugs: Marijuana.  Allergies  Allergen Reactions   Gadolinium Derivatives Shortness Of Breath    Bronchospasms-Pt asthmatic and had albuterol  inhaler, which he used and it broke the spasms. Will need 13 hour prep for furture MRI lillie 06/17/24-AG,RN    Family History  Problem Relation Age of Onset   Heart attack Mother    Heart attack Father     Prior to Admission medications   Medication Sig Start Date End Date Taking? Authorizing Provider  albuterol  (PROVENTIL  HFA;VENTOLIN  HFA) 108 (90 Base) MCG/ACT inhaler Inhale 2 puffs into the lungs every 6 (six) hours as needed for wheezing or shortness of breath. 02/28/18  Yes Diallo, Abdoulaye, MD  albuterol  (PROVENTIL ) (2.5 MG/3ML) 0.083% nebulizer solution Take 2.5 mg by nebulization every 6 (six) hours as needed for wheezing or shortness of breath.   Yes [provider]  amiodarone (PACERONE) 200 MG tablet Take 1 tablet (200 mg total) by mouth 2 (two) times daily. 08/01/24  Yes Krishnan, Gokul, MD  apixaban (ELIQUIS) 5 MG TABS tablet Take 1 tablet (5 mg total) by mouth 2 (two) times daily. 08/01/24  Yes Krishnan, Gokul, MD  cetirizine (ZYRTEC) 10 MG tablet Take 10 mg by mouth daily.   Yes  [provider]  Cholecalciferol (VITAMIN D) 50 MCG (2000 UT) tablet Take 2,000 Units by mouth daily.   Yes [provider]  cyclobenzaprine  (FLEXERIL ) 10 MG tablet Take 10 mg by mouth at bedtime as needed for muscle spasms.   Yes [provider]  digoxin (LANOXIN) 0.125 MG tablet Take 1 tablet (0.125 mg total) by mouth daily. 08/01/24  Yes Krishnan, Gokul, MD  empagliflozin (JARDIANCE) 10 MG TABS tablet Take 1 tablet (10 mg total) by mouth daily. 08/01/24  Yes Verdene Purchase, MD  fluticasone  [facility]) 50 MCG/ACT nasal spray Place 1 spray into both nostrils daily as needed for allergies. 02/09/22  Yes [provider]  Fluticasone -Salmeterol (ADVAIR) 250-50 MCG/DOSE AEPB Inhale 1 puff into the lungs 2 (two) times daily. 02/28/18  Yes Diallo, Abdoulaye, MD  furosemide (LASIX) 40 MG tablet Take 1 tablet (40 mg total) by mouth daily. 08/01/24  Yes Krishnan, Gokul, MD  gabapentin  (NEURONTIN ) 300 MG capsule Take 1 capsule (300 mg total) by mouth 3 (three) times daily. 06/08/17  Yes Odis Burnard Jansky, PA-C  hydrOXYzine (ATARAX) 25 MG tablet Take 25 mg by mouth at bedtime.   Yes [provider]  montelukast (SINGULAIR) 10 MG tablet Take 10 mg by mouth daily  as needed (allergies).   Yes [provider]  ondansetron  (ZOFRAN ) 4 MG tablet Take 1 tablet (4 mg total) by mouth every 8 (eight) hours as needed for nausea or vomiting. 06/03/22  Yes Sharl Selinda Dover, MD  sacubitril-valsartan (ENTRESTO) 24-26 MG Take 1 tablet by mouth 2 (two) times daily. 08/01/24  Yes Krishnan, Gokul, MD  spironolactone (ALDACTONE) 25 MG tablet Take 1 tablet (25 mg total) by mouth daily. 08/01/24  Yes Krishnan, Gokul, MD  triamcinolone cream (KENALOG) 0.1 % Apply 1 Application topically daily as needed (for rash). 04/07/22  Yes [provider]    Physical Exam: Vitals:   08/07/24 0730 08/07/24 0730 08/07/24 0800 08/07/24 0900  BP: 131/89  121/88 111/68  Pulse: 71  73 81   Resp: 14  (!) 21 19  Temp:      SpO2: 100%  99% 99%  Weight:  85.1 kg    Height:  5' 9 (1.753 m)     Constitutional: Older adult male currently in no acute distress Eyes: PERRL, lids and conjunctivae normal ENMT: Mucous membranes are moist.    Neck: normal, supple  Respiratory: clear to auscultation bilaterally, no wheezing, no crackles.   No accessory muscle use.  Cardiovascular: Irregular irregular. No extremity edema. 2+ pedal pulses.   Abdomen: no tenderness, no masses palpated.  Bowel sounds positive.  Musculoskeletal: no clubbing / cyanosis. No joint deformity upper and lower extremities. Good ROM, no contractures. Normal muscle tone.  Skin: no rashes, lesions, ulcers. No induration Neurologic: CN 2-12 grossly intact.   Strength 5/5 in all 4.  Psychiatric: Normal judgment and insight. Alert and oriented x 3. Normal mood.   Data Reviewed:  EKG revealed atrial fibrillation at 84 bpm.  Reviewed labs, imaging, and pertinent records as documented  Assessment and Plan:  Suspected partial bowel obstruction  Possible volvulus Patient presented with acute onset of crampy abdominal pain with nausea and vomiting.  General surgery had been consulted and recommended NG tube but patient cannot tolerate.  GI was consulted as there was concern for the possibility of volvulus.  Patient had been given 500 mL bolus of IV fluids.  Risk factors for bowel obstruction include history of bowel resection. - Admit to a telemetry bed - Aspiration precautions with elevation head of bed  - N.p.o. for sigmoidoscopy - Continue normal saline IV fluids at 50 mL/h - Appreciate general surgery and GI consultative services follow-up for any further recommendations  Acute kidney injury superimposed on chronic kidney disease stage IIIa Upon admission creatinine noted to be 1.8 with BUN 33.  Baseline creatinine previously had been around 1.3-1.4 when checked previously. - Hold possible nephrotoxic agent -  Gentle IV fluids at 50 mL/h  - Recheck kidney function in a.m.  Paroxysmal atrial fibrillation Patient currently in atrial fibrillation but appears relatively rate controlled.  Underwent cardioversion during last hospitalization on 11/3 and on 11/6, but converted back into atrial fibrillation. - Continue amiodarone - Held Eliquis and started on heparin drip for possible need of procedure.  Systolic congestive heart failure Chronic.  Patient appears euvolemic on physical exam at this time.  With recent admission into the hospital for congestive heart failure exacerbation noted to have EF of 25%. - Strict I&O's and daily weights - Initially held furosemide and spironolactone due to AKI   Essential hypertension Blood pressures currently maintained.  Urinalysis did not note any acute abnormality. - Held Entresto and diuretics due to concern for AKI  Hepatic cirrhosis Noted during  previous hospitalization.  Thought likely secondary to patient's prior history of alcohol abuse.  Asthma Patient without signs of wheezing on physical exam at this time. - Albuterol  nebs as needed for shortness of breath/wheezing  History of colon cancer Patient with a history of colon cancer status postresection back in 1999.  History of alcohol and tobacco abuse Patient reports that he quit smoking cigarettes and drinking alcohol approximately 6 months ago  DVT prophylaxis: Heparin Advance Care Planning:   Code Status: Full Code   Consults: GI, general surgery  Family Communication: None  Severity of Illness: The appropriate patient status for this patient is OBSERVATION. Observation status is judged to be reasonable and necessary in order to provide the required intensity of service to ensure the patient's safety. The patient's presenting symptoms, physical exam findings, and initial radiographic and laboratory data in the context of their medical condition is felt to place them at decreased risk for  further clinical deterioration. Furthermore, it is anticipated that the patient will be medically stable for discharge from the hospital within 2 midnights of admission.   Author: Maximino DELENA Sharps, MD 08/07/2024 9:50 AM  For on call review [email or web address].

## 2024-08-07 NOTE — ED Notes (Signed)
 Dr. Ebbie (gen surg) at [facility]

## 2024-08-07 NOTE — ED Notes (Signed)
 Hospitalist at bedside

## 2024-08-07 NOTE — ED Provider Notes (Signed)
 [facility] EMERGENCY DEPARTMENT AT [facility] Provider Note   CSN: [id number] Arrival date & time: 08/07/24  0246     Patient presents with: Abdominal Pain and Hypertension   Benjamin Abbott is a 63 y.o. male with a past medical history significant for COPD, remote colon cancer, hypertension, history of tobacco and alcohol abuse (notes he hasn't had alcohol in almost a year), recent diagnosis of CHF and A. Fib who presents to the ED due to generalized abdominal pain associated with distention.  Admits to 2 episodes of nonbloody, nonbilious emesis this morning.  Had a bowel movement while he was in the ED however, was having some constipation prior.  Recently admitted to the hospital on 10/26 to 11/6 due to abdominal distention, lower extremity edema, and shortness of breath.  During his admission patient received an echocardiogram which showed an EF of 25%.  He was found to be in A-fib.  Patient notes he has been compliant with all of his medications since being discharged.  Denies fever and chills.  Denies chest pain.  Admits to some shortness of breath.  Also endorses orthopnea (2 pillow).   History obtained from patient and past medical records. No interpreter used during encounter.       Prior to Admission medications   Medication Sig Start Date End Date Taking? Authorizing Provider  albuterol  (PROVENTIL  HFA;VENTOLIN  HFA) 108 (90 Base) MCG/ACT inhaler Inhale 2 puffs into the lungs every 6 (six) hours as needed for wheezing or shortness of breath. 02/28/18  Yes Diallo, Abdoulaye, MD  albuterol  (PROVENTIL ) (2.5 MG/3ML) 0.083% nebulizer solution Take 2.5 mg by nebulization every 6 (six) hours as needed for wheezing or shortness of breath.   Yes [provider]  amiodarone (PACERONE) 200 MG tablet Take 1 tablet (200 mg total) by mouth 2 (two) times daily. 08/01/24  Yes Krishnan, Gokul, MD  apixaban (ELIQUIS) 5 MG TABS tablet Take 1 tablet (5 mg total) by mouth 2 (two) times  daily. 08/01/24  Yes Krishnan, Gokul, MD  cetirizine (ZYRTEC) 10 MG tablet Take 10 mg by mouth daily.   Yes [provider]  Cholecalciferol (VITAMIN D) 50 MCG (2000 UT) tablet Take 2,000 Units by mouth daily.   Yes [provider]  cyclobenzaprine  (FLEXERIL ) 10 MG tablet Take 10 mg by mouth at bedtime as needed for muscle spasms.   Yes [provider]  digoxin (LANOXIN) 0.125 MG tablet Take 1 tablet (0.125 mg total) by mouth daily. 08/01/24  Yes Krishnan, Gokul, MD  empagliflozin (JARDIANCE) 10 MG TABS tablet Take 1 tablet (10 mg total) by mouth daily. 08/01/24  Yes Verdene Purchase, MD  fluticasone  [facility]) 50 MCG/ACT nasal spray Place 1 spray into both nostrils daily as needed for allergies. 02/09/22  Yes [provider]  Fluticasone -Salmeterol (ADVAIR) 250-50 MCG/DOSE AEPB Inhale 1 puff into the lungs 2 (two) times daily. 02/28/18  Yes Diallo, Abdoulaye, MD  furosemide (LASIX) 40 MG tablet Take 1 tablet (40 mg total) by mouth daily. 08/01/24  Yes Krishnan, Gokul, MD  gabapentin  (NEURONTIN ) 300 MG capsule Take 1 capsule (300 mg total) by mouth 3 (three) times daily. 06/08/17  Yes Odis Burnard Jansky, PA-C  hydrOXYzine (ATARAX) 25 MG tablet Take 25 mg by mouth at bedtime.   Yes [provider]  montelukast (SINGULAIR) 10 MG tablet Take 10 mg by mouth daily as needed (allergies).   Yes [provider]  ondansetron  (ZOFRAN ) 4 MG tablet Take 1 tablet (4 mg total) by mouth every  8 (eight) hours as needed for nausea or vomiting. 06/03/22  Yes Sharl Selinda Dover, MD  sacubitril-valsartan (ENTRESTO) 24-26 MG Take 1 tablet by mouth 2 (two) times daily. 08/01/24  Yes Krishnan, Gokul, MD  spironolactone (ALDACTONE) 25 MG tablet Take 1 tablet (25 mg total) by mouth daily. 08/01/24  Yes Krishnan, Gokul, MD  triamcinolone cream (KENALOG) 0.1 % Apply 1 Application topically daily as needed (for rash). 04/07/22  Yes [provider]    Allergies: Gadolinium  derivatives    Review of Systems  Constitutional:  Negative for chills and fever.  Respiratory:  Positive for shortness of breath.   Cardiovascular:  Negative for chest pain and leg swelling.  Gastrointestinal:  Positive for abdominal distention, abdominal pain, diarrhea, nausea and vomiting.    Updated Vital Signs BP 111/68   Pulse 81   Temp 98 F (36.7 C)   Resp 19   Ht 5' 9 (1.753 m)   Wt 85.1 kg   SpO2 99%   BMI 27.71 kg/m   Physical Exam Vitals and nursing note reviewed.  Constitutional:      General: He is not in acute distress.    Appearance: He is not ill-appearing.  HENT:     Head: Normocephalic.  Eyes:     Pupils: Pupils are equal, round, and reactive to light.  Cardiovascular:     Rate and Rhythm: Normal rate and regular rhythm.     Pulses: Normal pulses.     Heart sounds: Normal heart sounds. No murmur heard.    No friction rub. No gallop.  Pulmonary:     Effort: Pulmonary effort is normal.     Breath sounds: Normal breath sounds.  Abdominal:     General: Abdomen is flat. There is distension.     Palpations: Abdomen is soft.     Tenderness: There is abdominal tenderness. There is no guarding or rebound.  Musculoskeletal:        General: Normal range of motion.     Cervical back: Neck supple.     Comments: No lower extremity edema  Skin:    General: Skin is warm and dry.  Neurological:     General: No focal deficit present.     Mental Status: He is alert.  Psychiatric:        Mood and Affect: Mood normal.        Behavior: Behavior normal.     (all labs ordered are listed, but only abnormal results are displayed) Labs Reviewed  COMPREHENSIVE METABOLIC PANEL WITH GFR - Abnormal; Notable for the following components:      Result Value   CO2 17 (*)    Glucose, Bld 110 (*)    BUN 33 (*)    Creatinine, Ser 1.80 (*)    ALT 56 (*)    GFR, Estimated 42 (*)    All other components within normal limits  URINALYSIS, ROUTINE W REFLEX MICROSCOPIC -  Abnormal; Notable for the following components:   Color, Urine STRAW (*)    Glucose, UA 150 (*)    All other components within normal limits  LIPASE, BLOOD  CBC  BRAIN NATRIURETIC PEPTIDE  TROPONIN I (HIGH SENSITIVITY)  TROPONIN I (HIGH SENSITIVITY)    EKG: EKG Interpretation Date/Time:  Wednesday August 07 2024 07:20:12 EST Ventricular Rate:  84 PR Interval:    QRS Duration:  99 QT Interval:  387 QTC Calculation: 458 R Axis:   -72  Text Interpretation: Atrial fibrillation Left anterior fascicular block Abnormal  R-wave progression, late transition Nonspecific ST and T wave abnormalities Compared with prior EKG from 07/22/2024 Confirmed by Gennaro Bouchard ([id number]) on 08/07/2024 7:33:36 AM  Radiology: CT ABDOMEN PELVIS W CONTRAST Result Date: 08/07/2024 CLINICAL DATA:  Suspect bowel obstruction. Generalized abdominal pain with emesis and constipation. EXAM: CT ABDOMEN AND PELVIS WITH CONTRAST TECHNIQUE: Multidetector CT imaging of the abdomen and pelvis was performed using the standard protocol following bolus administration of intravenous contrast. RADIATION DOSE REDUCTION: This exam was performed according to the departmental dose-optimization program which includes automated exposure control, adjustment of the mA and/or kV according to patient size and/or use of iterative reconstruction technique. CONTRAST:  75mL OMNIPAQUE  IOHEXOL  350 MG/ML SOLN COMPARISON:  07/21/2024 FINDINGS: Lower chest: Mild stable cardiomegaly. Visualized lung bases are clear. Hepatobiliary: Gallbladder is contracted. Minimal low attenuation of the liver compatible with a degree of steatosis without focal mass. Biliary tree is unremarkable. Pancreas: Normal. Spleen: Normal. Adrenals/Urinary Tract: Adrenal glands are normal. Kidneys are normal in size without hydronephrosis or nephrolithiasis. Ureters and bladder are normal. Stomach/Bowel: Stomach mildly distended with ingested contents. Proximal to mid small bowel  is within normal. Several suture lines over the rectosigmoid colon. Moderate fecal retention over the rectosigmoid colon. Findings suggest subtotal colectomy. Sigmoid colon appears to extend into the right abdomen where there is a relative area of narrowing with associated twisting of the mesentery in the right abdomen just right of midline. The relative area of narrowing then becomes more prominent where there is another suture line over this bowel loop in the anterior mid abdomen as this measures approximately 13.3 cm in diameter and is unchanged compared to the prior exam. It is difficult to follow the bowel loops through this area with there is twisting of the mesentery. There is likely a degree of partial obstruction through this area of marrow bowel in the right abdomen although it is not completely obstructive. There is no evidence of free fluid, focal inflammatory change or pneumatosis. Vascular/Lymphatic: Minimal plaque over the abdominal aorta which is normal caliber. Remaining vascular structures are unremarkable. No adenopathy. Reproductive: Prostate is unremarkable. Other: None. Musculoskeletal: No focal abnormality. IMPRESSION: 1. Multiple surgical changes of the bowel at least reflecting subtotal colectomy with several suture lines over the rectosigmoid colon. Sigmoid colon extends into the right abdomen where there is a relative area of narrowing with associated twisting of the mesentery. The relative narrowing then becomes dilated in the anterior mid abdomen measuring 13.3 cm in diameter as this is unchanged and likely represent a degree of partial obstruction. Recommend serial follow-up imaging. 2. Mild hepatic steatosis. 3. Aortic atherosclerosis. Aortic Atherosclerosis (ICD10-I70.0). Electronically Signed   By: Toribio Agreste M.D.   On: 08/07/2024 09:12   DG Chest Portable 1 View Result Date: 08/07/2024 CLINICAL DATA:  Shortness of breath. EXAM: PORTABLE CHEST 1 VIEW COMPARISON:  07/27/2024  FINDINGS: The cardio pericardial silhouette is enlarged. The lungs are clear without focal pneumonia, edema, pneumothorax or pleural effusion. No acute bony abnormality. Interval movable of right PICC line. Telemetry leads overlie the chest. IMPRESSION: Enlargement of the cardiopericardial silhouette. No acute cardiopulmonary findings. Electronically Signed   By: Camellia Candle M.D.   On: 08/07/2024 07:50     Procedures   Medications Ordered in the ED  morphine (PF) 4 MG/ML injection 4 mg (has no administration in time range)  sodium chloride  0.9 % bolus 500 mL (has no administration in time range)  iohexol  (OMNIPAQUE ) 350 MG/ML injection 75 mL (75 mLs Intravenous Contrast Given  08/07/24 0828)    Clinical Course as of 08/07/24 0951  Wed Aug 07, 2024  0934 Discussed with general surgery, Burnard RIGGERS who will see patient.  Recommends placing NG tube. [CA]    Clinical Course User Index [CA] Lorelle Aleck BROCKS, PA-C                                 Medical Decision Making Amount and/or Complexity of Data Reviewed Labs: ordered. Decision-making details documented in ED Course. Radiology: ordered and independent interpretation performed. Decision-making details documented in ED Course.  Risk Prescription drug management. Decision regarding hospitalization.   This patient presents to the ED for concern of abdominal pain, this involves an extensive number of treatment options, and is a complaint that carries with it a high risk of complications and morbidity.  The differential diagnosis includes ascites, bowel obstruction, malignancy, etc  63 year old male presents to the ED due to generalized abdominal pain associated with nausea, vomiting, and constipation.  Also endorses abdominal distention.  Recent admission and discharged on 11/6 with new onset A-fib and CHF.  Patient has been compliant with all of his medications since being discharged. CT abdomen during his most recent admission showed  hepatic cirrhosis with small volume ascites. EGD unremarkable. Upon arrival patient afebrile, not tachycardic or hypoxic.  Patient hypertensive 151/132.  Notes he has not taken his morning medications.  On exam, patient has diffuse abdominal tenderness with slight distention.  No lower extremity edema.  Routine labs ordered. BNP to rule out CHF.  CT abdomen ordered.   CBC unremarkable.  No leukocytosis.  Normal hemoglobin.  CMP with elevated creatinine 1.8 and BUN at 33. Small amount of IVFs given due to EF of 25%.  BNP normal.  Low suspicion for CHF exacerbation.  Troponin normal.  EKG A-fib.  Low suspicion for ACS.  Chest x-ray personally viewed interpreted demonstrates enlargement of the cardiopericardial silhouette.  No evidence of pneumonia.  CT abdomen demonstrates a partial bowel obstruction.  Discussed with general surgery.  See note above.  NG tube placed.  Co morbidities that complicate the patient evaluation  A. Fib, CHF Cardiac Monitoring: / EKG:  The patient was maintained on a cardiac monitor.  I personally viewed and interpreted the cardiac monitored which showed an underlying rhythm of: A. Fib  Social Determinants of Health:  History of alcohol/tobacco abuse  Test / Admission - Considered:  Admit for partial bowel obstruction  9:50 AM Discussed with Dr. Claudene with [facility] who agrees to admit     Final diagnoses:  Partial intestinal obstruction, unspecified cause [facility])  Atrial fibrillation, unspecified type [facility])    ED Discharge Orders     None          Lorelle Aleck BROCKS RIGGERS 08/07/24 0951    Gennaro Duwaine CROME, DO 08/08/24 [phone]

## 2024-08-07 NOTE — H&P (View-Only) (Signed)
 [facility] Gastroenterology Consult  Referring Provider: No ref. provider found Primary Care Physician:  Leontine Cramp, NP Primary Gastroenterologist: Dr.Kenna Kirn  Reason for Consultation: Possible sigmoid volvulus  HPI: Benjamin Abbott is a 63 y.o. male who was in his usual state of health until yesterday evening when after eating dinner he developed sudden onset of nausea, vomiting and generalized abdominal pain.  He reports having a bowel movement around 2 AM in the ED, reports it was small in amount and abdominal pain has improved slightly with pain medication in the ER..   Prior GI workup: Colonoscopy 2021, personal history of colon cancer(in the 30s requiring surgery in 1990s in [place] ), family history of colon cancer(mother at age 1): Colocolonic anastomosis in rectosigmoid area, at 20 cm and ileocolonic anastomosis noted in right colon, 3 tubular adenomas removed from descending colon, repeat colonoscopy recommended in 3 years EGD, 2021, dysphagia, early satiety, bloating: Biopsies negative for H. pylori, celiac and EOE She was supposed to have a repeat colonoscopy for surveillance in 12/2023, but states that he has been hospitalized multiple times and has not been able to complete his colonoscopy for surveillance. EGD 07/26/2024, cirrhosis noted on CAT scan, rule out esophageal varices: Normal esophagus, normal stomach, normal duodenum  He has a past medical history of CHF, A-fib, status post cardioversion with EF 25 to 30%, COPD.  Past Medical History:  Diagnosis Date   Arthritis    Asthma    Back pain    Chronic pain    Colon cancer (HCC)    COPD (chronic obstructive pulmonary disease) (HCC)    Dyspnea    with exertion   GERD (gastroesophageal reflux disease)    Hypertension    Knee pain    Neuropathy    Obesity    Sleep apnea    cpap    Past Surgical History:  Procedure Laterality Date   ARTHROSCOPY WITH ANTERIOR CRUCIATE LIGAMENT (ACL) REPAIR WITH ANTERIOR  TIBILIAS GRAFT Right    BOWEL RESECTION     CARDIOVERSION N/A 07/29/2024   Procedure: CARDIOVERSION;  Surgeon: Cherrie Toribio SAUNDERS, MD;  Location: [facility] INVASIVE CV LAB;  Service: Cardiovascular;  Laterality: N/A;   CARDIOVERSION N/A 08/01/2024   Procedure: CARDIOVERSION;  Surgeon: Cherrie Toribio SAUNDERS, MD;  Location: [facility] INVASIVE CV LAB;  Service: Cardiovascular;  Laterality: N/A;   ESOPHAGOGASTRODUODENOSCOPY N/A 07/26/2024   Procedure: EGD (ESOPHAGOGASTRODUODENOSCOPY);  Surgeon: Rosalie Kitchens, MD;  Location: [facility] ENDOSCOPY;  Service: Gastroenterology;  Laterality: N/A;   KNEE ARTHROSCOPY WITH LATERAL MENISECTOMY Left 06/03/2022   Procedure: KNEE ARTHROSCOPY WITH PARTIAL  LATERAL MENISECTOMY;  Surgeon: Sharl Selinda Dover, MD;  Location: [facility] ORS;  Service: Orthopedics;  Laterality: Left;   RIGHT/LEFT HEART CATH AND CORONARY ANGIOGRAPHY N/A 07/26/2024   Procedure: RIGHT/LEFT HEART CATH AND CORONARY ANGIOGRAPHY;  Surgeon: Elmira Newman PARAS, MD;  Location: [facility] INVASIVE CV LAB;  Service: Cardiovascular;  Laterality: N/A;   TRANSESOPHAGEAL ECHOCARDIOGRAM (CATH LAB) N/A 07/29/2024   Procedure: TRANSESOPHAGEAL ECHOCARDIOGRAM;  Surgeon: Cherrie Toribio SAUNDERS, MD;  Location: [facility] INVASIVE CV [facility];  Service: Cardiovascular;  Laterality: N/A;    Prior to Admission medications   Medication Sig Start Date End Date Taking? Authorizing Provider  albuterol  (PROVENTIL  HFA;VENTOLIN  HFA) 108 (90 Base) MCG/ACT inhaler Inhale 2 puffs into the lungs every 6 (six) hours as needed for wheezing or shortness of breath. 02/28/18  Yes Diallo, Abdoulaye, MD  albuterol  (PROVENTIL ) (2.5 MG/3ML) 0.083% nebulizer solution Take 2.5 mg by nebulization every 6 (six) hours as needed for wheezing or  shortness of breath.   Yes [provider]  amiodarone (PACERONE) 200 MG tablet Take 1 tablet (200 mg total) by mouth 2 (two) times daily. 08/01/24  Yes Krishnan, Gokul, MD  apixaban (ELIQUIS) 5 MG TABS tablet Take 1 tablet (5 mg total) by mouth  2 (two) times daily. 08/01/24  Yes Krishnan, Gokul, MD  cetirizine (ZYRTEC) 10 MG tablet Take 10 mg by mouth daily.   Yes [provider]  Cholecalciferol (VITAMIN D) 50 MCG (2000 UT) tablet Take 2,000 Units by mouth daily.   Yes [provider]  cyclobenzaprine  (FLEXERIL ) 10 MG tablet Take 10 mg by mouth at bedtime as needed for muscle spasms.   Yes [provider]  digoxin (LANOXIN) 0.125 MG tablet Take 1 tablet (0.125 mg total) by mouth daily. 08/01/24  Yes Krishnan, Gokul, MD  empagliflozin (JARDIANCE) 10 MG TABS tablet Take 1 tablet (10 mg total) by mouth daily. 08/01/24  Yes Verdene Purchase, MD  fluticasone  [facility]) 50 MCG/ACT nasal spray Place 1 spray into both nostrils daily as needed for allergies. 02/09/22  Yes [provider]  Fluticasone -Salmeterol (ADVAIR) 250-50 MCG/DOSE AEPB Inhale 1 puff into the lungs 2 (two) times daily. 02/28/18  Yes Diallo, Abdoulaye, MD  furosemide (LASIX) 40 MG tablet Take 1 tablet (40 mg total) by mouth daily. 08/01/24  Yes Krishnan, Gokul, MD  gabapentin  (NEURONTIN ) 300 MG capsule Take 1 capsule (300 mg total) by mouth 3 (three) times daily. 06/08/17  Yes Odis Burnard Jansky, PA-C  hydrOXYzine (ATARAX) 25 MG tablet Take 25 mg by mouth at bedtime.   Yes [provider]  montelukast (SINGULAIR) 10 MG tablet Take 10 mg by mouth daily as needed (allergies).   Yes [provider]  ondansetron  (ZOFRAN ) 4 MG tablet Take 1 tablet (4 mg total) by mouth every 8 (eight) hours as needed for nausea or vomiting. 06/03/22  Yes Sharl Selinda Dover, MD  sacubitril-valsartan (ENTRESTO) 24-26 MG Take 1 tablet by mouth 2 (two) times daily. 08/01/24  Yes Krishnan, Gokul, MD  spironolactone (ALDACTONE) 25 MG tablet Take 1 tablet (25 mg total) by mouth daily. 08/01/24  Yes Krishnan, Gokul, MD  triamcinolone cream (KENALOG) 0.1 % Apply 1 Application topically daily as needed (for rash). 04/07/22  Yes [provider]    Current  Facility-Administered Medications  Medication Dose Route Frequency Provider Last Rate Last Admin   acetaminophen  (TYLENOL ) tablet 650 mg  650 mg Oral Q6H PRN Smith, Rondell A, MD       Or   acetaminophen  (TYLENOL ) suppository 650 mg  650 mg Rectal Q6H PRN Smith, Rondell A, MD       albuterol  (PROVENTIL ) (2.5 MG/3ML) 0.083% nebulizer solution 2.5 mg  2.5 mg Nebulization Q6H PRN Claudene, Rondell A, MD       heparin ADULT infusion 100 units/mL (25000 units/250mL)  1,200 Units/hr Intravenous Continuous Tanda Powell ORN, RPH 12 mL/hr at 08/07/24 1126 1,200 Units/hr at 08/07/24 1126   morphine (PF) 2 MG/ML injection 2 mg  2 mg Intravenous Q2H PRN Smith, Rondell A, MD       ondansetron  (ZOFRAN ) injection 4 mg  4 mg Intravenous Q6H PRN Smith, Rondell A, MD       [START ON 08/08/2024] pneumococcal 20-valent conjugate vaccine (PREVNAR 20) injection 0.5 mL  0.5 mL Intramuscular Tomorrow-1000 Smith, Rondell A, MD       sodium chloride  flush (NS) 0.9 % injection 3 mL  3 mL Intravenous Q12H Claudene Maximino LABOR, MD  Current Outpatient Medications  Medication Sig Dispense Refill   albuterol  (PROVENTIL  HFA;VENTOLIN  HFA) 108 (90 Base) MCG/ACT inhaler Inhale 2 puffs into the lungs every 6 (six) hours as needed for wheezing or shortness of breath. 18 g 1   albuterol  (PROVENTIL ) (2.5 MG/3ML) 0.083% nebulizer solution Take 2.5 mg by nebulization every 6 (six) hours as needed for wheezing or shortness of breath.     amiodarone (PACERONE) 200 MG tablet Take 1 tablet (200 mg total) by mouth 2 (two) times daily. 120 tablet 0   apixaban (ELIQUIS) 5 MG TABS tablet Take 1 tablet (5 mg total) by mouth 2 (two) times daily. 120 tablet 0   cetirizine (ZYRTEC) 10 MG tablet Take 10 mg by mouth daily.     Cholecalciferol (VITAMIN D) 50 MCG (2000 UT) tablet Take 2,000 Units by mouth daily.     cyclobenzaprine  (FLEXERIL ) 10 MG tablet Take 10 mg by mouth at bedtime as needed for muscle spasms.     digoxin (LANOXIN) 0.125 MG tablet  Take 1 tablet (0.125 mg total) by mouth daily. 60 tablet 0   empagliflozin (JARDIANCE) 10 MG TABS tablet Take 1 tablet (10 mg total) by mouth daily. 60 tablet 0   fluticasone  (FLONASE) 50 MCG/ACT nasal spray Place 1 spray into both nostrils daily as needed for allergies.     Fluticasone -Salmeterol (ADVAIR) 250-50 MCG/DOSE AEPB Inhale 1 puff into the lungs 2 (two) times daily. 14 each 1   furosemide (LASIX) 40 MG tablet Take 1 tablet (40 mg total) by mouth daily. 60 tablet 0   gabapentin  (NEURONTIN ) 300 MG capsule Take 1 capsule (300 mg total) by mouth 3 (three) times daily. 90 capsule 0   hydrOXYzine (ATARAX) 25 MG tablet Take 25 mg by mouth at bedtime.     montelukast (SINGULAIR) 10 MG tablet Take 10 mg by mouth daily as needed (allergies).     ondansetron  (ZOFRAN ) 4 MG tablet Take 1 tablet (4 mg total) by mouth every 8 (eight) hours as needed for nausea or vomiting. 20 tablet 0   sacubitril-valsartan (ENTRESTO) 24-26 MG Take 1 tablet by mouth 2 (two) times daily. 120 tablet 0   spironolactone (ALDACTONE) 25 MG tablet Take 1 tablet (25 mg total) by mouth daily. 60 tablet 0   triamcinolone cream (KENALOG) 0.1 % Apply 1 Application topically daily as needed (for rash).      Allergies as of 08/07/2024 - Review Complete 08/07/2024  Allergen Reaction Noted   Gadolinium derivatives Shortness Of Breath 06/17/2024    Family History  Problem Relation Age of Onset   Heart attack Mother    Heart attack Father     Social History   Socioeconomic History   Marital status: Single    Spouse name: Not on file   Number of children: 1   Years of education: Not on file   Highest education level: High school graduate  Occupational History   Occupation: Disabilty  Tobacco Use   Smoking status: Former    Current packs/day: 0.00    Average packs/day: 0.5 packs/day for 36.5 years (18.2 ttl pk-yrs)    Types: Cigarettes    Start date: 75    Quit date: 03/18/2020    Years since quitting: 4.3    Smokeless tobacco: Never  Vaping Use   Vaping status: Never Used  Substance and Sexual Activity   Alcohol use: Not Currently    Alcohol/week: 3.0 standard drinks of alcohol    Types: 3 Cans of beer per week  Comment: occ beer   Drug use: Not Currently    Types: Marijuana    Comment: daily   Sexual activity: Yes  Other Topics Concern   Not on file  Social History Narrative   Not on file   Social Drivers of Health   Financial Resource Strain: Low Risk  (07/24/2024)   Overall Financial Resource Strain (CARDIA)    Difficulty of Paying Living Expenses: Not very hard  Food Insecurity: No Food Insecurity (08/07/2024)   Hunger Vital Sign    Worried About Running Out of Food in the Last Year: Never true    Ran Out of Food in the Last Year: Never true  Transportation Needs: No Transportation Needs (08/07/2024)   PRAPARE - Administrator, Civil Service (Medical): No    Lack of Transportation (Non-Medical): No  Physical Activity: Not on file  Stress: Not on file  Social Connections: Unknown (11/08/2022)   Received from [facility]   Social Network    Social Network: Not on file  Intimate Partner Violence: Not At Risk (08/07/2024)   Humiliation, Afraid, Rape, and Kick questionnaire    Fear of Current or Ex-Partner: No    Emotionally Abused: No    Physically Abused: No    Sexually Abused: No    Review of Systems: As per HPI  Physical Exam: Vital signs in last 24 hours: Temp:  [97.9 F (36.6 C)-98.7 F (37.1 C)] 98.7 F (37.1 C) (11/12 1130) Pulse Rate:  [54-83] 59 (11/12 1200) Resp:  [13-21] 21 (11/12 1200) BP: (104-151)/(63-132) 130/86 (11/12 1200) SpO2:  [98 %-100 %] 98 % (11/12 1200) Weight:  [85.1 kg] 85.1 kg (11/12 0730)    General:   Alert,  Well-developed, well-nourished, pleasant and cooperative in NAD Head:  Normocephalic and atraumatic. Eyes:  Sclera clear, no icterus.   Conjunctiva pink. Ears:  Normal auditory acuity. Nose:  No deformity,  discharge,  or lesions. Mouth:  No deformity or lesions.  Oropharynx pink & moist. Neck:  Supple; no masses or thyromegaly. Lungs:  Clear throughout to auscultation.   No wheezes, crackles, or rhonchi. No acute distress. Heart:  Regular rate and rhythm; no murmurs, clicks, rubs,  or gallops. Extremities:  Without clubbing or edema. Neurologic:  Alert and  oriented x4;  grossly normal neurologically. Skin:  Intact without significant lesions or rashes. Psych:  Alert and cooperative. Normal mood and affect. Abdomen: Distended, sluggish bowel sounds, mild generalized tenderness         Lab Results: Recent Labs    08/07/24 0306  WBC 8.9  HGB 15.5  HCT 47.8  PLT 257   BMET Recent Labs    08/07/24 0306  NA 135  K 4.8  CL 105  CO2 17*  GLUCOSE 110*  BUN 33*  CREATININE 1.80*  CALCIUM 9.3   LFT Recent Labs    08/07/24 0306  PROT 7.5  ALBUMIN 3.6  AST 33  ALT 56*  ALKPHOS 83  BILITOT 0.6   PT/INR No results for input(s): LABPROT, INR in the last 72 hours.  Studies/Results: CT ABDOMEN PELVIS W CONTRAST Addendum Date: 08/07/2024 ADDENDUM REPORT: 08/07/2024 12:07 ADDENDUM: Above findings discussed with Dr. Saintclair as we discussed possibility of sigmoid volvulus. Electronically Signed   By: Toribio Agreste M.D.   On: 08/07/2024 12:07   Result Date: 08/07/2024 CLINICAL DATA:  Suspect bowel obstruction. Generalized abdominal pain with emesis and constipation. EXAM: CT ABDOMEN AND PELVIS WITH CONTRAST TECHNIQUE: Multidetector CT imaging of the abdomen  and pelvis was performed using the standard protocol following bolus administration of intravenous contrast. RADIATION DOSE REDUCTION: This exam was performed according to the departmental dose-optimization program which includes automated exposure control, adjustment of the mA and/or kV according to patient size and/or use of iterative reconstruction technique. CONTRAST:  75mL OMNIPAQUE  IOHEXOL  350 MG/ML SOLN COMPARISON:   07/21/2024 FINDINGS: Lower chest: Mild stable cardiomegaly. Visualized lung bases are clear. Hepatobiliary: Gallbladder is contracted. Minimal low attenuation of the liver compatible with a degree of steatosis without focal mass. Biliary tree is unremarkable. Pancreas: Normal. Spleen: Normal. Adrenals/Urinary Tract: Adrenal glands are normal. Kidneys are normal in size without hydronephrosis or nephrolithiasis. Ureters and bladder are normal. Stomach/Bowel: Stomach mildly distended with ingested contents. Proximal to mid small bowel is within normal. Several suture lines over the rectosigmoid colon. Moderate fecal retention over the rectosigmoid colon. Findings suggest subtotal colectomy. Sigmoid colon appears to extend into the right abdomen where there is a relative area of narrowing with associated twisting of the mesentery in the right abdomen just right of midline. The relative area of narrowing then becomes more prominent where there is another suture line over this bowel loop in the anterior mid abdomen as this measures approximately 13.3 cm in diameter and is unchanged compared to the prior exam. It is difficult to follow the bowel loops through this area with there is twisting of the mesentery. There is likely a degree of partial obstruction through this area of marrow bowel in the right abdomen although it is not completely obstructive. There is no evidence of free fluid, focal inflammatory change or pneumatosis. Vascular/Lymphatic: Minimal plaque over the abdominal aorta which is normal caliber. Remaining vascular structures are unremarkable. No adenopathy. Reproductive: Prostate is unremarkable. Other: None. Musculoskeletal: No focal abnormality. IMPRESSION: 1. Multiple surgical changes of the bowel at least reflecting subtotal colectomy with several suture lines over the rectosigmoid colon. Sigmoid colon extends into the right abdomen where there is a relative area of narrowing with associated twisting  of the mesentery. The relative narrowing then becomes dilated in the anterior mid abdomen measuring 13.3 cm in diameter as this is unchanged and likely represent a degree of partial obstruction. Recommend serial follow-up imaging. 2. Mild hepatic steatosis. 3. Aortic atherosclerosis. Aortic Atherosclerosis (ICD10-I70.0). Electronically Signed: By: Toribio Agreste M.D. On: 08/07/2024 09:12   DG Chest Portable 1 View Result Date: 08/07/2024 CLINICAL DATA:  Shortness of breath. EXAM: PORTABLE CHEST 1 VIEW COMPARISON:  07/27/2024 FINDINGS: The cardio pericardial silhouette is enlarged. The lungs are clear without focal pneumonia, edema, pneumothorax or pleural effusion. No acute bony abnormality. Interval movable of right PICC line. Telemetry leads overlie the chest. IMPRESSION: Enlargement of the cardiopericardial silhouette. No acute cardiopulmonary findings. Electronically Signed   By: Camellia Candle M.D.   On: 08/07/2024 07:50    Impression: Possible sigmoid volvulus History of colon cancer, status post right hemicolectomy and diverting ileostomy with reversal and ileocolonic anastomosis Discussed CAT scan findings with radiologist: Difficult to ascertain whether patient has true sigmoid volvulus versus distended small bowel  Lab abnormalities: Acidosis, bicarb 17 Renal impairment, BUN 33, creatinine 1.8, GFR 42 Elevated ALT 56  CT from 10/25 showed nodular hepatic contour suggesting cirrhosis, moderate ascites, generalized edema of subcutaneous and intra-abdominal fat, small bilateral pleural effusions  Was on heparin until an hour ago Last dose of Eliquis was yesterday  Plan: Emergent sigmoidoscopy in an attempt to decompress sigmoid volvulus. Discussed the risks and the benefits with the patient in details. He  understands and verbalizes consent.   LOS: 0 days   Estelita Manas, MD  08/07/2024, 12:42 PM

## 2024-08-07 NOTE — ED Triage Notes (Addendum)
 Patient reports generalized abdominal pain with emesis /constipation this evening . Hypertensive at arrival .

## 2024-08-07 NOTE — Progress Notes (Signed)
 Patient ID: Benjamin Abbott, male   DOB: [birth year], 63 y.o.   MRN: [id number] Post flex sig which got into descending colon with dilation and a lot of stool no convincing volvulus.  His xray looks no better. When I examine him he is mild distended suprisingly and is not tender.  Will try to have an ng placed.  Will observe for now. No convincing reason he needs to be explored yet but did discuss that as option

## 2024-08-07 NOTE — Transfer of Care (Signed)
 Immediate Anesthesia Transfer of Care Note  Patient: Benjamin Abbott  Procedure(s) Performed: [place] SIDE  Patient Location: PACU  Anesthesia Type:General  Level of Consciousness: awake and alert   Airway & Oxygen Therapy: Patient Spontanous Breathing and Patient connected to face mask oxygen  Post-op Assessment: Report given to RN and Post -op Vital signs reviewed and stable  Post vital signs: Reviewed and stable  Last Vitals:  Vitals Value Taken Time  BP 146/115 08/07/24 14:54  Temp    Pulse 92 08/07/24 14:57  Resp 31 08/07/24 14:57  SpO2 97 % 08/07/24 14:57  Vitals shown include unfiled device data.  Last Pain:  Vitals:   08/07/24 1355  TempSrc: Temporal  PainSc: 0-No pain         Complications: No notable events documented.

## 2024-08-08 ENCOUNTER — Observation Stay (HOSPITAL_COMMUNITY)

## 2024-08-08 DIAGNOSIS — G629 Polyneuropathy, unspecified: Secondary | ICD-10-CM | POA: Diagnosis present

## 2024-08-08 DIAGNOSIS — J4489 Other specified chronic obstructive pulmonary disease: Secondary | ICD-10-CM | POA: Diagnosis present

## 2024-08-08 DIAGNOSIS — Z85038 Personal history of other malignant neoplasm of large intestine: Secondary | ICD-10-CM | POA: Diagnosis not present

## 2024-08-08 DIAGNOSIS — Z7901 Long term (current) use of anticoagulants: Secondary | ICD-10-CM | POA: Diagnosis not present

## 2024-08-08 DIAGNOSIS — N179 Acute kidney failure, unspecified: Secondary | ICD-10-CM | POA: Diagnosis present

## 2024-08-08 DIAGNOSIS — Z860101 Personal history of adenomatous and serrated colon polyps: Secondary | ICD-10-CM | POA: Diagnosis not present

## 2024-08-08 DIAGNOSIS — K5939 Other megacolon: Secondary | ICD-10-CM | POA: Diagnosis present

## 2024-08-08 DIAGNOSIS — K7460 Unspecified cirrhosis of liver: Secondary | ICD-10-CM | POA: Diagnosis present

## 2024-08-08 DIAGNOSIS — Z79899 Other long term (current) drug therapy: Secondary | ICD-10-CM | POA: Diagnosis not present

## 2024-08-08 DIAGNOSIS — F1011 Alcohol abuse, in remission: Secondary | ICD-10-CM | POA: Diagnosis present

## 2024-08-08 DIAGNOSIS — K219 Gastro-esophageal reflux disease without esophagitis: Secondary | ICD-10-CM | POA: Diagnosis present

## 2024-08-08 DIAGNOSIS — Z800 Family history of malignant neoplasm of digestive organs: Secondary | ICD-10-CM | POA: Diagnosis not present

## 2024-08-08 DIAGNOSIS — Z9049 Acquired absence of other specified parts of digestive tract: Secondary | ICD-10-CM | POA: Diagnosis not present

## 2024-08-08 DIAGNOSIS — K56600 Partial intestinal obstruction, unspecified as to cause: Secondary | ICD-10-CM | POA: Diagnosis present

## 2024-08-08 DIAGNOSIS — I5022 Chronic systolic (congestive) heart failure: Secondary | ICD-10-CM | POA: Diagnosis present

## 2024-08-08 DIAGNOSIS — N189 Chronic kidney disease, unspecified: Secondary | ICD-10-CM | POA: Diagnosis not present

## 2024-08-08 DIAGNOSIS — Z7951 Long term (current) use of inhaled steroids: Secondary | ICD-10-CM | POA: Diagnosis not present

## 2024-08-08 DIAGNOSIS — I480 Paroxysmal atrial fibrillation: Secondary | ICD-10-CM | POA: Diagnosis present

## 2024-08-08 DIAGNOSIS — I130 Hypertensive heart and chronic kidney disease with heart failure and stage 1 through stage 4 chronic kidney disease, or unspecified chronic kidney disease: Secondary | ICD-10-CM | POA: Diagnosis present

## 2024-08-08 DIAGNOSIS — N1831 Chronic kidney disease, stage 3a: Secondary | ICD-10-CM | POA: Diagnosis present

## 2024-08-08 DIAGNOSIS — K469 Unspecified abdominal hernia without obstruction or gangrene: Secondary | ICD-10-CM | POA: Diagnosis present

## 2024-08-08 DIAGNOSIS — Z87891 Personal history of nicotine dependence: Secondary | ICD-10-CM | POA: Diagnosis not present

## 2024-08-08 DIAGNOSIS — E872 Acidosis, unspecified: Secondary | ICD-10-CM | POA: Diagnosis present

## 2024-08-08 DIAGNOSIS — E876 Hypokalemia: Secondary | ICD-10-CM | POA: Diagnosis present

## 2024-08-08 DIAGNOSIS — Z91041 Radiographic dye allergy status: Secondary | ICD-10-CM | POA: Diagnosis not present

## 2024-08-08 DIAGNOSIS — Z8249 Family history of ischemic heart disease and other diseases of the circulatory system: Secondary | ICD-10-CM | POA: Diagnosis not present

## 2024-08-08 LAB — COMPREHENSIVE METABOLIC PANEL WITH GFR
ALT: 55 U/L — ABNORMAL HIGH (ref 0–44)
AST: 32 U/L (ref 15–41)
Albumin: 3.2 g/dL — ABNORMAL LOW (ref 3.5–5.0)
Alkaline Phosphatase: 78 U/L (ref 38–126)
Anion gap: 9 (ref 5–15)
BUN: 31 mg/dL — ABNORMAL HIGH (ref 8–23)
CO2: 17 mmol/L — ABNORMAL LOW (ref 22–32)
Calcium: 8.8 mg/dL — ABNORMAL LOW (ref 8.9–10.3)
Chloride: 108 mmol/L (ref 98–111)
Creatinine, Ser: 1.36 mg/dL — ABNORMAL HIGH (ref 0.61–1.24)
GFR, Estimated: 59 mL/min — ABNORMAL LOW (ref >60)
Glucose, Bld: 106 mg/dL — ABNORMAL HIGH (ref 70–99)
Potassium: 4.8 mmol/L (ref 3.5–5.1)
Sodium: 134 mmol/L — ABNORMAL LOW (ref 135–145)
Total Bilirubin: 1 mg/dL (ref 0.0–1.2)
Total Protein: 6.9 g/dL (ref 6.5–8.1)

## 2024-08-08 LAB — CBC
HCT: 45.7 % (ref 39.0–52.0)
Hemoglobin: 15.3 g/dL (ref 13.0–17.0)
MCH: 28.5 pg (ref 26.0–34.0)
MCHC: 33.5 g/dL (ref 30.0–36.0)
MCV: 85.1 fL (ref 80.0–100.0)
Platelets: 258 K/uL (ref 150–400)
RBC: 5.37 MIL/uL (ref 4.22–5.81)
RDW: 15.4 % (ref 11.5–15.5)
WBC: 8.3 K/uL (ref 4.0–10.5)
nRBC: 0 % (ref 0.0–0.2)

## 2024-08-08 LAB — HEPARIN LEVEL (UNFRACTIONATED): Heparin Unfractionated: 0.69 [IU]/mL (ref 0.30–0.70)

## 2024-08-08 LAB — APTT: aPTT: 67 s — ABNORMAL HIGH (ref 24–36)

## 2024-08-08 MED ORDER — FLEET ENEMA RE ENEM
1.0000 | ENEMA | Freq: Two times a day (BID) | RECTAL | Status: AC
  Administered 2024-08-08 (×2): 1 via RECTAL
  Filled 2024-08-08 (×2): qty 1

## 2024-08-08 MED ORDER — LACTATED RINGERS IV SOLN: INTRAVENOUS | Status: DC

## 2024-08-08 NOTE — Plan of Care (Signed)

## 2024-08-08 NOTE — Progress Notes (Signed)
 Subjective: Denies having bowel movements but states he has been passing flatus.  Complains of mild abdominal discomfort.  NG tube has been placed and connected to low intermittent wall suction.  Objective: Vital signs in last 24 hours: Temp:  [97.5 F (36.4 C)-98.7 F (37.1 C)] 97.9 F (36.6 C) (11/13 0819) Pulse Rate:  [49-108] 62 (11/13 0819) Resp:  [13-31] 18 (11/13 0415) BP: (88-180)/(63-131) 120/74 (11/13 0819) SpO2:  [94 %-100 %] 99 % (11/13 0819) Weight change:  Last BM Date :  (PTA)  PE: Nonicteric, no pallor GENERAL: Appears comfortable  ABDOMEN: Distended but soft abdomen, nontender, sluggish bowel sounds EXTREMITIES: No deformity  Lab Results: Results for orders placed or performed during the hospital encounter of 08/07/24 (from the past 48 hours)  Lipase, blood     Status: None   Collection Time: 08/07/24  3:06 AM  Result Value Ref Range   Lipase 46 11 - 51 U/L    Comment: Performed at [facility], [address], [place], [place] [address]  Comprehensive metabolic panel     Status: Abnormal   Collection Time: 08/07/24  3:06 AM  Result Value Ref Range   Sodium 135 135 - 145 mmol/L   Potassium 4.8 3.5 - 5.1 mmol/L   Chloride 105 98 - 111 mmol/L   CO2 17 (L) 22 - 32 mmol/L   Glucose, Bld 110 (H) 70 - 99 mg/dL    Comment: Glucose reference range applies only to samples taken after fasting for at least 8 hours.   BUN 33 (H) 8 - 23 mg/dL   Creatinine, Ser 8.19 (H) 0.61 - 1.24 mg/dL   Calcium 9.3 8.9 - 89.6 mg/dL   Total Protein 7.5 6.5 - 8.1 g/dL   Albumin 3.6 3.5 - 5.0 g/dL   AST 33 15 - 41 U/L   ALT 56 (H) 0 - 44 U/L   Alkaline Phosphatase 83 38 - 126 U/L   Total Bilirubin 0.6 0.0 - 1.2 mg/dL   GFR, Estimated 42 (L) >60 mL/min    Comment: (NOTE) Calculated using the CKD-EPI Creatinine Equation (2021)    Anion gap 13 5 - 15    Comment: Performed at [facility], [address] [place], [place] [address]  CBC     Status: None   Collection  Time: 08/07/24  3:06 AM  Result Value Ref Range   WBC 8.9 4.0 - 10.5 K/uL   RBC 5.52 4.22 - 5.81 MIL/uL   Hemoglobin 15.5 13.0 - 17.0 g/dL   HCT 52.1 60.9 - 47.9 %   MCV 86.6 80.0 - 100.0 fL   MCH 28.1 26.0 - 34.0 pg   MCHC 32.4 30.0 - 36.0 g/dL   RDW 84.5 88.4 - 84.4 %   Platelets 257 150 - 400 K/uL   nRBC 0.0 0.0 - 0.2 %    Comment: Performed at [facility], [address], [place], [place] [address]  Brain natriuretic peptide     Status: None   Collection Time: 08/07/24  7:22 AM  Result Value Ref Range   B Natriuretic Peptide 64.7 0.0 - 100.0 pg/mL    Comment: Performed at [facility], [address], [place], [place] [address]  Troponin I (High Sensitivity)     Status: None   Collection Time: 08/07/24  7:34 AM  Result Value Ref Range   Troponin I (High Sensitivity) 17 <18 ng/L    Comment: (NOTE) Elevated high sensitivity troponin I (  hsTnI) values and significant  changes across serial measurements may suggest ACS but many other  chronic and acute conditions are known to elevate hsTnI results.  Refer to the Links section for chest pain algorithms and additional  guidance. Performed at [facility], [address], [place], [place] [address]   Urinalysis, Routine w reflex microscopic -Urine, Clean Catch     Status: Abnormal   Collection Time: 08/07/24  8:35 AM  Result Value Ref Range   Color, Urine STRAW (A) YELLOW   APPearance CLEAR CLEAR   Specific Gravity, Urine 1.012 1.005 - 1.030   pH 5.0 5.0 - 8.0   Glucose, UA 150 (A) NEGATIVE mg/dL   Hgb urine dipstick NEGATIVE NEGATIVE   Bilirubin Urine NEGATIVE NEGATIVE   Ketones, ur NEGATIVE NEGATIVE mg/dL   Protein, ur NEGATIVE NEGATIVE mg/dL   Nitrite NEGATIVE NEGATIVE   Leukocytes,Ua NEGATIVE NEGATIVE    Comment: Performed at [facility], [address], [place], [place] [address]  Troponin I (High Sensitivity)     Status: Abnormal   Collection Time: 08/07/24  9:13 AM  Result Value Ref Range    Troponin I (High Sensitivity) 19 (H) <18 ng/L    Comment: (NOTE) Elevated high sensitivity troponin I (hsTnI) values and significant  changes across serial measurements may suggest ACS but many other  chronic and acute conditions are known to elevate hsTnI results.  Refer to the Links section for chest pain algorithms and additional  guidance. Performed at [facility], [address], [place], [place] [address]   Lactic acid, plasma     Status: None   Collection Time: 08/07/24 10:08 AM  Result Value Ref Range   Lactic Acid, Venous 1.0 0.5 - 1.9 mmol/L    Comment: Performed at [facility], [address], [place], [place] [address]  Heparin level (unfractionated)     Status: None   Collection Time: 08/07/24  7:56 PM  Result Value Ref Range   Heparin Unfractionated 0.39 0.30 - 0.70 IU/mL    Comment: (NOTE) The clinical reportable range upper limit is being lowered to >1.10 to align with the FDA approved guidance for the current laboratory assay.  If heparin results are below expected values, and patient dosage has  been confirmed, suggest follow up testing of antithrombin III levels. Performed at [facility], [address], [place], [place] [address]   APTT     Status: Abnormal   Collection Time: 08/07/24  7:56 PM  Result Value Ref Range   aPTT 44 (H) 24 - 36 seconds    Comment:        IF BASELINE aPTT IS ELEVATED, SUGGEST PATIENT RISK ASSESSMENT BE USED TO DETERMINE APPROPRIATE ANTICOAGULANT THERAPY. Performed at [facility], [address] [place], [place] [address]   CBC     Status: None   Collection Time: 08/08/24  4:47 AM  Result Value Ref Range   WBC 8.3 4.0 - 10.5 K/uL   RBC 5.37 4.22 - 5.81 MIL/uL   Hemoglobin 15.3 13.0 - 17.0 g/dL   HCT 54.2 60.9 - 47.9 %   MCV 85.1 80.0 - 100.0 fL   MCH 28.5 26.0 - 34.0 pg   MCHC 33.5 30.0 - 36.0 g/dL   RDW 84.5 88.4 - 84.4 %   Platelets 258 150 - 400 K/uL   nRBC 0.0 0.0 - 0.2 %     Comment: Performed at [facility],  [address], [place], [place] [address]  Comprehensive metabolic panel     Status: Abnormal   Collection Time: 08/08/24  4:47 AM  Result Value Ref Range   Sodium 134 (L) 135 - 145 mmol/L   Potassium 4.8 3.5 - 5.1 mmol/L   Chloride 108 98 - 111 mmol/L   CO2 17 (L) 22 - 32 mmol/L   Glucose, Bld 106 (H) 70 - 99 mg/dL    Comment: Glucose reference range applies only to samples taken after fasting for at least 8 hours.   BUN 31 (H) 8 - 23 mg/dL   Creatinine, Ser 8.63 (H) 0.61 - 1.24 mg/dL   Calcium 8.8 (L) 8.9 - 10.3 mg/dL   Total Protein 6.9 6.5 - 8.1 g/dL   Albumin 3.2 (L) 3.5 - 5.0 g/dL   AST 32 15 - 41 U/L   ALT 55 (H) 0 - 44 U/L   Alkaline Phosphatase 78 38 - 126 U/L   Total Bilirubin 1.0 0.0 - 1.2 mg/dL   GFR, Estimated 59 (L) >60 mL/min    Comment: (NOTE) Calculated using the CKD-EPI Creatinine Equation (2021)    Anion gap 9 5 - 15    Comment: Performed at [facility], [address], [place], [place] [address]  APTT     Status: Abnormal   Collection Time: 08/08/24  4:47 AM  Result Value Ref Range   aPTT 67 (H) 24 - 36 seconds    Comment:        IF BASELINE aPTT IS ELEVATED, SUGGEST PATIENT RISK ASSESSMENT BE USED TO DETERMINE APPROPRIATE ANTICOAGULANT THERAPY. Performed at [facility], [address] [place], [place] [address]   Heparin level (unfractionated)     Status: None   Collection Time: 08/08/24  4:47 AM  Result Value Ref Range   Heparin Unfractionated 0.69 0.30 - 0.70 IU/mL    Comment: (NOTE) The clinical reportable range upper limit is being lowered to >1.10 to align with the FDA approved guidance for the current laboratory assay.  If heparin results are below expected values, and patient dosage has  been confirmed, suggest follow up testing of antithrombin III levels. Performed at [facility], [address], [place], [place] [address]     Studies/Results: [place] Abd Portable 1V Result  Date: 08/08/2024 EXAM: 1 VIEW XRAY OF THE ABDOMEN 08/08/2024 06:14:26 AM COMPARISON: Portable abdomen film yesterday at 5:20 pm. CLINICAL HISTORY: Ileus (HCC). FINDINGS: LINES, TUBES AND DEVICES: NGT is partially coiled in the proximal stomach. BOWEL: Mild gastric air distention is again noted. Small bowel distention in the upper abdomen is again seen up to 3.5 cm. There is aerated stool in the rectum. The degree of bowel dilatation slightly improved today. SOFT TISSUES: No opaque urinary calculi. BONES: No acute osseous abnormality. IMPRESSION: 1. Small bowel distention in the upper abdomen up to 3.5 cm, slightly improved compared to the prior study. 2. Nasogastric tube partially coiled in the proximal stomach. 3. Mild gastric air distention. Electronically signed by: Francis Quam MD 08/08/2024 06:42 AM EST RP Workstation: [id number]   DG Abd Portable 1 View Result Date: 08/07/2024 CLINICAL DATA:  NG tube placement EXAM: PORTABLE ABDOMEN - 1 VIEW COMPARISON:  Radiograph and CT yesterday FINDINGS: Tip and side port of the enteric tube below the diaphragm in the stomach. Gaseous bowel distention in the upper abdomen, slightly diminished from yesterday. IMPRESSION: Tip and side port of the enteric tube below the diaphragm in the stomach. Electronically  Signed   By: Andrea Gasman M.D.   On: 08/07/2024 17:49   DG Abd 1 View Result Date: 08/07/2024 CLINICAL DATA:  Sigmoid volvulus EXAM: ABDOMEN - 1 VIEW COMPARISON:  CT abdomen and pelvis 08/07/2024 FINDINGS: There is a dilated loop of colon in the left and central abdomen measuring up to 16 cm similar to prior study. There is also gaseous distention of colon in the central and lower abdomen. Minimal small bowel gas identified. Is difficult to exclude free air on this supine view. No suspicious calcifications. There is contrast in the bladder. No acute fractures are identified. A surgical clip overlies the right lower quadrant. IMPRESSION: Unchanged gaseous  dilated colon in the central and lower abdomen concerning for large bowel obstruction. Electronically Signed   By: Greig Pique M.D.   On: 08/07/2024 16:27   CT ABDOMEN PELVIS W CONTRAST Addendum Date: 08/07/2024 ADDENDUM REPORT: 08/07/2024 12:07 ADDENDUM: Above findings discussed with Dr. Saintclair as we discussed possibility of sigmoid volvulus. Electronically Signed   By: Toribio Agreste M.D.   On: 08/07/2024 12:07   Result Date: 08/07/2024 CLINICAL DATA:  Suspect bowel obstruction. Generalized abdominal pain with emesis and constipation. EXAM: CT ABDOMEN AND PELVIS WITH CONTRAST TECHNIQUE: Multidetector CT imaging of the abdomen and pelvis was performed using the standard protocol following bolus administration of intravenous contrast. RADIATION DOSE REDUCTION: This exam was performed according to the departmental dose-optimization program which includes automated exposure control, adjustment of the mA and/or kV according to patient size and/or use of iterative reconstruction technique. CONTRAST:  75mL OMNIPAQUE  IOHEXOL  350 MG/ML SOLN COMPARISON:  07/21/2024 FINDINGS: Lower chest: Mild stable cardiomegaly. Visualized lung bases are clear. Hepatobiliary: Gallbladder is contracted. Minimal low attenuation of the liver compatible with a degree of steatosis without focal mass. Biliary tree is unremarkable. Pancreas: Normal. Spleen: Normal. Adrenals/Urinary Tract: Adrenal glands are normal. Kidneys are normal in size without hydronephrosis or nephrolithiasis. Ureters and bladder are normal. Stomach/Bowel: Stomach mildly distended with ingested contents. Proximal to mid small bowel is within normal. Several suture lines over the rectosigmoid colon. Moderate fecal retention over the rectosigmoid colon. Findings suggest subtotal colectomy. Sigmoid colon appears to extend into the right abdomen where there is a relative area of narrowing with associated twisting of the mesentery in the right abdomen just right of  midline. The relative area of narrowing then becomes more prominent where there is another suture line over this bowel loop in the anterior mid abdomen as this measures approximately 13.3 cm in diameter and is unchanged compared to the prior exam. It is difficult to follow the bowel loops through this area with there is twisting of the mesentery. There is likely a degree of partial obstruction through this area of marrow bowel in the right abdomen although it is not completely obstructive. There is no evidence of free fluid, focal inflammatory change or pneumatosis. Vascular/Lymphatic: Minimal plaque over the abdominal aorta which is normal caliber. Remaining vascular structures are unremarkable. No adenopathy. Reproductive: Prostate is unremarkable. Other: None. Musculoskeletal: No focal abnormality. IMPRESSION: 1. Multiple surgical changes of the bowel at least reflecting subtotal colectomy with several suture lines over the rectosigmoid colon. Sigmoid colon extends into the right abdomen where there is a relative area of narrowing with associated twisting of the mesentery. The relative narrowing then becomes dilated in the anterior mid abdomen measuring 13.3 cm in diameter as this is unchanged and likely represent a degree of partial obstruction. Recommend serial follow-up imaging. 2. Mild hepatic steatosis. 3.  Aortic atherosclerosis. Aortic Atherosclerosis (ICD10-I70.0). Electronically Signed: By: Toribio Agreste M.D. On: 08/07/2024 09:12   DG Chest Portable 1 View Result Date: 08/07/2024 CLINICAL DATA:  Shortness of breath. EXAM: PORTABLE CHEST 1 VIEW COMPARISON:  07/27/2024 FINDINGS: The cardio pericardial silhouette is enlarged. The lungs are clear without focal pneumonia, edema, pneumothorax or pleural effusion. No acute bony abnormality. Interval movable of right PICC line. Telemetry leads overlie the chest. IMPRESSION: Enlargement of the cardiopericardial silhouette. No acute cardiopulmonary findings.  Electronically Signed   By: Camellia Candle M.D.   On: 08/07/2024 07:50    Medications: I have reviewed the patient's current medications.  Assessment: Suspected volvulus on admission Sigmoidoscopy did not show typical changes of volvulus, prep was extremely poor precluding mucosal visualization, decompression was performed Abdominal x-ray today shows small bowel distention up to 3.5 cm, slightly improved, mild gastric air distention, elevated stool in rectum with delay of bowel dilation slightly improved  History of colon cancer with multiple prior surgeries, possible subtotal colectomy Colonoscopy from 2021 showed 3 possible anastomosis, at rectosigmoid, at 20 cm and again at ileocolonic anastomosis  Comorbidities: A-fib, currently on heparin, CHF, EF 25 to 30%, COPD  Plan: Discussed with surgical PA, possibly give patient enemas, if tolerated MiraLAX via NG tube, monitor abdominal x-ray on a daily basis to see if this causes any improvement.  Estelita Manas, MD 08/08/2024, 8:34 AM

## 2024-08-08 NOTE — Progress Notes (Signed)
 PHARMACY - ANTICOAGULATION CONSULT NOTE  Pharmacy Consult for heparin  Indication: atrial fibrillation  Allergies  Allergen Reactions   Gadolinium Derivatives Shortness Of Breath    Bronchospasms-Pt asthmatic and had albuterol  inhaler, which he used and it broke the spasms. 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,RN    Patient Measurements: Height: 5' 9 (175.3 cm) Weight: 85.1 kg (187 lb 9.8 oz) IBW/kg (Calculated) : 70.7 HEPARIN DW (KG): 85.1  Vital Signs: Temp: 98 F (36.7 C) (11/13 0415) BP: 119/75 (11/13 0415) Pulse Rate: 54 (11/13 0415)  Labs: Recent Labs    08/07/24 0306 08/07/24 0734 08/07/24 0913 08/07/24 1956 08/08/24 0447  HGB 15.5  --   --   --  15.3  HCT 47.8  --   --   --  45.7  PLT 257  --   --   --  258  APTT  --   --   --  44* 67*  HEPARINUNFRC  --   --   --  0.39 0.69  CREATININE 1.80*  --   --   --  1.36*  TROPONINIHS  --  17 19*  --   --     Estimated Creatinine Clearance: 61.7 mL/min (A) (by C-G formula based on SCr of 1.36 mg/dL (H)).   Medical History: Past Medical History:  Diagnosis Date   Arthritis    Asthma    Back pain    Chronic pain    Colon cancer (HCC)    COPD (chronic obstructive pulmonary disease) (HCC)    Dyspnea    with exertion   GERD (gastroesophageal reflux disease)    Hypertension    Knee pain    Neuropathy    Obesity    Sleep apnea    cpap   Assessment: Patient presented with CC of abdominal pain and emesis. Found to have potential SBO and possible sigmoid volvulus. Potential for sigmoid colectomy. Known hx of Afib on Eliquis PTA, last dose 11/11 @ noon. Pharmacy consulted to dose heparin gtt in peri-op time period.   HL 0.67, aPTT 67 - both therapeutic  CBC stable   Goal of Therapy:  Heparin level 0.3-0.7 units/ml aPTT 66-102 seconds Monitor platelets by anticoagulation protocol: Yes   Plan:  Decrease heparin infusion to 1250 units/hr to maintain therapeutic range  Will transition  to heparin levels for monitoring F/u daily heparin level, cbc and s/sx of bleeding F/u plans to transition back to eliquis  Thank you for allowing pharmacy to participate in this patient's care.  Sharyne Glatter, PharmD, BCCCP Critical Care Clinical Pharmacist 08/08/2024 7:04 AM

## 2024-08-08 NOTE — Plan of Care (Signed)
  Problem: Education: Goal: Knowledge of General Education information will improve Description: Including pain rating scale, medication(s)/side effects and non-pharmacologic comfort measures Outcome: Progressing   Problem: Elimination: Goal: Will not experience complications related to bowel motility Outcome: Progressing   Problem: Pain Managment: Goal: General experience of comfort will improve and/or be controlled Outcome: Progressing   Problem: Skin Integrity: Goal: Risk for impaired skin integrity will decrease Outcome: Progressing

## 2024-08-08 NOTE — Progress Notes (Signed)
 PROGRESS NOTE   Nashid Pellum  FMW:[id number]    DOB: [birth year]    DOA: 08/07/2024  PCP: Leontine Cramp, NP   I have briefly reviewed patients previous medical records in [facility]   Brief Hospital Course:  63 year old male with medical history significant for colon cancer, s/p multiple surgeries, right colectomy, ostomy followed by ostomy reversal, prior colonoscopy showing 3 possible anastomosis, HTN, PAF on DOAC, COPD, alcohol and tobacco use disorder, presented to the ED with complaints of abdominal pain, nausea and vomiting.  CT abdomen concerning for sigmoid volvulus.  Underwent emergent sigmoidoscopy without typical changes of volvulus, probably prepped bowel.  Follow-up abdominal x-ray showing small bowel distention.  Plan on bowel prep including enemas, MiraLAX via NG tube, serial abdominal x-rays and consideration for colonoscopy.  [facility] following.   Assessment & Plan:   Bowel obstruction, LBO versus SBO History as noted above.  History of multiple abdominal surgeries. [facility] consultation and follow-up appreciated. Emergent sigmoidoscopy 11/12 did not show typical features for sigmoid volvulus but the bowel prep was poor. Continuing n.p.o., NG tube, IVF, plan on enemas, MiraLAX enterally if tolerated, follow clinically and with serial abdominal x-rays with consideration for colonoscopy.  Acute kidney injury complicating stage IIIa CKD NAGMA Baseline creatinine probably in the 1.3-1.4 range.  Presented with creatinine of 1.8.  Suspected due to poor oral intake, nausea and vomiting AKI resolved after IV fluids.  Creatinine back to baseline 1.3. Avoid hypotension and nephrotoxic meds.  Continue to trend daily BMP  Paroxysmal atrial fibrillation Rate controlled on p.o. amiodarone and digoxin, continue n.p.o. except meds and NG tube clamping for an hour post meds Eliquis held in case warrants procedures.  Continue IV heparin bridging per  pharmacy. Underwent cardioversion during last hospitalization on 11/3 and 11/6 but reverted back to A-fib.  Chronic systolic CHF Clinically euvolemic. 2D echo 11/3: LVEF <20%, LV global hypokinesis. Given severe systolic dysfunction, careful with IVF, gentle IVF and close monitoring. Holding Entresto and diuretics for now.  Hepatic cirrhosis Noted  Asthma Stable  History of alcohol and tobacco use disorder Reports that he quit smoking and drinking about 6 months ago.  History of colon cancer History as noted above.   Body mass index is 27.71 kg/m.   DVT prophylaxis:   On IV heparin drip   Code Status: Full Code:  Family Communication: None at bedside Disposition:  Status is: Observation The patient will require care spanning > 2 midnights and should be moved to inpatient because: Has no return of bowel function.  Ongoing n.p.o., IV fluids, NG tube etc.     Consultants:   [facility]  Procedures:   NG tube to low wall intermittent suction  Subjective:  Seen this morning.  Since hospital admission, passing some flatus.  No BM.  Still with diffuse mild abdominal pain but better than on admission.  Unable to tell if his belly is distended.  No chest pain or dyspnea.  Objective:   Vitals:   08/07/24 1931 08/08/24 0018 08/08/24 0415 08/08/24 0819  BP: (!) 88/64 127/86 119/75 120/74  Pulse: 70 (!) 51 (!) 54 62  Resp: 18 14 18    Temp: 98.1 F (36.7 C) 98 F (36.7 C) 98 F (36.7 C) 97.9 F (36.6 C)  TempSrc:    Oral  SpO2: 95% 94% 99% 99%  Weight:      Height:        General exam: Young male, moderately  built and nourished lying comfortably propped up in bed without distress. Respiratory system: Clear to auscultation. Respiratory effort normal. Cardiovascular system: S1 & S2 heard, irregularly irregular. No JVD, murmurs, rubs, gallops or clicks. No pedal edema.  Telemetry personally reviewed: A-fib with controlled ventricular  rate. Gastrointestinal system: Midline laparotomy scar and surgical scar in the RLQ.?  Abdomen mildly distended but is soft, nontender.  No organomegaly or masses appreciated.  Normal bowel sounds heard.  Has NG tube in place. Central nervous system: Alert and oriented. No focal neurological deficits. Extremities: Symmetric 5 x 5 power. Skin: No rashes, lesions or ulcers Psychiatry: Judgement and insight appear normal. Mood & affect appropriate.     Data Reviewed:   I have personally reviewed following labs and imaging studies   CBC: Recent Labs  Lab 08/07/24 0306 08/08/24 0447  WBC 8.9 8.3  HGB 15.5 15.3  HCT 47.8 45.7  MCV 86.6 85.1  PLT 257 258    Basic Metabolic Panel: Recent Labs  Lab 08/07/24 0306 08/08/24 0447  NA 135 134*  K 4.8 4.8  CL 105 108  CO2 17* 17*  GLUCOSE 110* 106*  BUN 33* 31*  CREATININE 1.80* 1.36*  CALCIUM 9.3 8.8*    Liver Function Tests: Recent Labs  Lab 08/07/24 0306 08/08/24 0447  AST 33 32  ALT 56* 55*  ALKPHOS 83 78  BILITOT 0.6 1.0  PROT 7.5 6.9  ALBUMIN 3.6 3.2*    CBG: No results for input(s): GLUCAP in the last 168 hours.  Microbiology Studies:  No results found for this or any previous visit (from the past 240 hours).  Radiology Studies:  DG Abd Portable 1V Result Date: 08/08/2024 EXAM: 1 VIEW XRAY OF THE ABDOMEN 08/08/2024 06:14:26 AM COMPARISON: Portable abdomen film yesterday at 5:20 pm. CLINICAL HISTORY: Ileus (HCC). FINDINGS: LINES, TUBES AND DEVICES: NGT is partially coiled in the proximal stomach. BOWEL: Mild gastric air distention is again noted. Small bowel distention in the upper abdomen is again seen up to 3.5 cm. There is aerated stool in the rectum. The degree of bowel dilatation slightly improved today. SOFT TISSUES: No opaque urinary calculi. BONES: No acute osseous abnormality. IMPRESSION: 1. Small bowel distention in the upper abdomen up to 3.5 cm, slightly improved compared to the prior study. 2.  Nasogastric tube partially coiled in the proximal stomach. 3. Mild gastric air distention. Electronically signed by: Francis Quam MD 08/08/2024 06:42 AM EST RP Workstation: [id number]   DG Abd Portable 1 View Result Date: 08/07/2024 CLINICAL DATA:  NG tube placement EXAM: PORTABLE ABDOMEN - 1 VIEW COMPARISON:  Radiograph and CT yesterday FINDINGS: Tip and side port of the enteric tube below the diaphragm in the stomach. Gaseous bowel distention in the upper abdomen, slightly diminished from yesterday. IMPRESSION: Tip and side port of the enteric tube below the diaphragm in the stomach. Electronically Signed   By: Andrea Gasman M.D.   On: 08/07/2024 17:49   DG Abd 1 View Result Date: 08/07/2024 CLINICAL DATA:  Sigmoid volvulus EXAM: ABDOMEN - 1 VIEW COMPARISON:  CT abdomen and pelvis 08/07/2024 FINDINGS: There is a dilated loop of colon in the left and central abdomen measuring up to 16 cm similar to prior study. There is also gaseous distention of colon in the central and lower abdomen. Minimal small bowel gas identified. Is difficult to exclude free air on this supine view. No suspicious calcifications. There is contrast in the bladder. No acute fractures are identified. A surgical clip overlies the  right lower quadrant. IMPRESSION: Unchanged gaseous dilated colon in the central and lower abdomen concerning for large bowel obstruction. Electronically Signed   By: Greig Pique M.D.   On: 08/07/2024 16:27   CT ABDOMEN PELVIS W CONTRAST Addendum Date: 08/07/2024 ADDENDUM REPORT: 08/07/2024 12:07 ADDENDUM: Above findings discussed with Dr. Saintclair as we discussed possibility of sigmoid volvulus. Electronically Signed   By: Toribio Agreste M.D.   On: 08/07/2024 12:07   Result Date: 08/07/2024 CLINICAL DATA:  Suspect bowel obstruction. Generalized abdominal pain with emesis and constipation. EXAM: CT ABDOMEN AND PELVIS WITH CONTRAST TECHNIQUE: Multidetector CT imaging of the abdomen and pelvis was  performed using the standard protocol following bolus administration of intravenous contrast. RADIATION DOSE REDUCTION: This exam was performed according to the departmental dose-optimization program which includes automated exposure control, adjustment of the mA and/or kV according to patient size and/or use of iterative reconstruction technique. CONTRAST:  75mL OMNIPAQUE  IOHEXOL  350 MG/ML SOLN COMPARISON:  07/21/2024 FINDINGS: Lower chest: Mild stable cardiomegaly. Visualized lung bases are clear. Hepatobiliary: Gallbladder is contracted. Minimal low attenuation of the liver compatible with a degree of steatosis without focal mass. Biliary tree is unremarkable. Pancreas: Normal. Spleen: Normal. Adrenals/Urinary Tract: Adrenal glands are normal. Kidneys are normal in size without hydronephrosis or nephrolithiasis. Ureters and bladder are normal. Stomach/Bowel: Stomach mildly distended with ingested contents. Proximal to mid small bowel is within normal. Several suture lines over the rectosigmoid colon. Moderate fecal retention over the rectosigmoid colon. Findings suggest subtotal colectomy. Sigmoid colon appears to extend into the right abdomen where there is a relative area of narrowing with associated twisting of the mesentery in the right abdomen just right of midline. The relative area of narrowing then becomes more prominent where there is another suture line over this bowel loop in the anterior mid abdomen as this measures approximately 13.3 cm in diameter and is unchanged compared to the prior exam. It is difficult to follow the bowel loops through this area with there is twisting of the mesentery. There is likely a degree of partial obstruction through this area of marrow bowel in the right abdomen although it is not completely obstructive. There is no evidence of free fluid, focal inflammatory change or pneumatosis. Vascular/Lymphatic: Minimal plaque over the abdominal aorta which is normal caliber.  Remaining vascular structures are unremarkable. No adenopathy. Reproductive: Prostate is unremarkable. Other: None. Musculoskeletal: No focal abnormality. IMPRESSION: 1. Multiple surgical changes of the bowel at least reflecting subtotal colectomy with several suture lines over the rectosigmoid colon. Sigmoid colon extends into the right abdomen where there is a relative area of narrowing with associated twisting of the mesentery. The relative narrowing then becomes dilated in the anterior mid abdomen measuring 13.3 cm in diameter as this is unchanged and likely represent a degree of partial obstruction. Recommend serial follow-up imaging. 2. Mild hepatic steatosis. 3. Aortic atherosclerosis. Aortic Atherosclerosis (ICD10-I70.0). Electronically Signed: By: Toribio Agreste M.D. On: 08/07/2024 09:12   DG Chest Portable 1 View Result Date: 08/07/2024 CLINICAL DATA:  Shortness of breath. EXAM: PORTABLE CHEST 1 VIEW COMPARISON:  07/27/2024 FINDINGS: The cardio pericardial silhouette is enlarged. The lungs are clear without focal pneumonia, edema, pneumothorax or pleural effusion. No acute bony abnormality. Interval movable of right PICC line. Telemetry leads overlie the chest. IMPRESSION: Enlargement of the cardiopericardial silhouette. No acute cardiopulmonary findings. Electronically Signed   By: Camellia Candle M.D.   On: 08/07/2024 07:50    Scheduled Meds:    amiodarone  200 mg  Oral BID   digoxin  0.125 mg Oral Daily   empagliflozin  10 mg Oral Daily   gabapentin   300 mg Oral TID   hydrOXYzine  25 mg Oral QHS   pneumococcal 20-valent conjugate vaccine  0.5 mL Intramuscular Tomorrow-1000   sodium chloride  flush  3 mL Intravenous Q12H    Continuous Infusions:    heparin 1,250 Units/hr (08/08/24 1021)     LOS: 0 days     Trenda Mar, MD,  FACP, [facility], [facility], [facility]   [facility] [place]      To contact the attending provider between 7A-7P or the  covering provider during after hours 7P-7A, please log into the web site [email or web address] and access using universal  password for that web site. If you do not have the password, please call the hospital operator.  08/08/2024, 10:39 AM

## 2024-08-08 NOTE — Progress Notes (Addendum)
 1 Day Post-Op  Subjective: CC: Flex sig results noted. Discussed w/ GI.  He reports less bloating since NGT placement yesterday. No nausea. Generalized abdominal pain earlier that is better after IV pain medication - no pain currently. Passing flatus. No BM.   Reports surgery in 1990s in [facility] [place]  for colon ca with colonic resection and ostomy followed by ostomy reversal. Colonoscopy from 2021 showed 3 possible anastomosis, at rectosigmoid, at 20 cm and again at ileocolonic anastomosis.   Afebrile. No tachycardia. Hypotension resolved. WBC wnl.   Objective: Vital signs in last 24 hours: Temp:  [97.5 F (36.4 C)-98.7 F (37.1 C)] 97.9 F (36.6 C) (11/13 0819) Pulse Rate:  [49-108] 62 (11/13 0819) Resp:  [13-31] 18 (11/13 0415) BP: (88-180)/(63-131) 120/74 (11/13 0819) SpO2:  [94 %-100 %] 99 % (11/13 0819) Last BM Date :  (PTA)  Intake/Output from previous day: 11/12 0701 - 11/13 0700 In: 1542.7 [I.V.:1042.7; IV Piggyback:500] Out: 1100 [Urine:950] Intake/Output this shift: Total I/O In: -  Out: 200 [Urine:200]  PE: Gen:  Alert, NAD, pleasant Abd: Soft, less distended, mild generalized ttp without rigidity or guarding, NGT with 100cc thin output in cannister.   Lab Results:  Recent Labs    08/07/24 0306 08/08/24 0447  WBC 8.9 8.3  HGB 15.5 15.3  HCT 47.8 45.7  PLT 257 258   BMET Recent Labs    08/07/24 0306 08/08/24 0447  NA 135 134*  K 4.8 4.8  CL 105 108  CO2 17* 17*  GLUCOSE 110* 106*  BUN 33* 31*  CREATININE 1.80* 1.36*  CALCIUM 9.3 8.8*   PT/INR No results for input(s): LABPROT, INR in the last 72 hours. CMP     Component Value Date/Time   NA 134 (L) 08/08/2024 0447   NA 141 02/28/2018 1538   K 4.8 08/08/2024 0447   CL 108 08/08/2024 0447   CO2 17 (L) 08/08/2024 0447   GLUCOSE 106 (H) 08/08/2024 0447   BUN 31 (H) 08/08/2024 0447   BUN 7 02/28/2018 1538   CREATININE 1.36 (H) 08/08/2024 0447   CALCIUM 8.8 (L) 08/08/2024  0447   PROT 6.9 08/08/2024 0447   PROT 7.3 06/08/2017 1115   ALBUMIN 3.2 (L) 08/08/2024 0447   ALBUMIN 4.6 06/08/2017 1115   AST 32 08/08/2024 0447   ALT 55 (H) 08/08/2024 0447   ALKPHOS 78 08/08/2024 0447   BILITOT 1.0 08/08/2024 0447   BILITOT 0.4 06/08/2017 1115   GFRNONAA 59 (L) 08/08/2024 0447   GFRAA >60 05/15/2019 1345   Lipase     Component Value Date/Time   LIPASE 46 08/07/2024 0306    Studies/Results: DG Abd Portable 1V Result Date: 08/08/2024 EXAM: 1 VIEW XRAY OF THE ABDOMEN 08/08/2024 06:14:26 AM COMPARISON: Portable abdomen film yesterday at 5:20 pm. CLINICAL HISTORY: Ileus (HCC). FINDINGS: LINES, TUBES AND DEVICES: NGT is partially coiled in the proximal stomach. BOWEL: Mild gastric air distention is again noted. Small bowel distention in the upper abdomen is again seen up to 3.5 cm. There is aerated stool in the rectum. The degree of bowel dilatation slightly improved today. SOFT TISSUES: No opaque urinary calculi. BONES: No acute osseous abnormality. IMPRESSION: 1. Small bowel distention in the upper abdomen up to 3.5 cm, slightly improved compared to the prior study. 2. Nasogastric tube partially coiled in the proximal stomach. 3. Mild gastric air distention. Electronically signed by: Francis Quam MD 08/08/2024 06:42 AM EST RP Workstation: [id number]   DG Abd Portable 1 View Result  Date: 08/07/2024 CLINICAL DATA:  NG tube placement EXAM: PORTABLE ABDOMEN - 1 VIEW COMPARISON:  Radiograph and CT yesterday FINDINGS: Tip and side port of the enteric tube below the diaphragm in the stomach. Gaseous bowel distention in the upper abdomen, slightly diminished from yesterday. IMPRESSION: Tip and side port of the enteric tube below the diaphragm in the stomach. Electronically Signed   By: Andrea Gasman M.D.   On: 08/07/2024 17:49   DG Abd 1 View Result Date: 08/07/2024 CLINICAL DATA:  Sigmoid volvulus EXAM: ABDOMEN - 1 VIEW COMPARISON:  CT abdomen and pelvis 08/07/2024  FINDINGS: There is a dilated loop of colon in the left and central abdomen measuring up to 16 cm similar to prior study. There is also gaseous distention of colon in the central and lower abdomen. Minimal small bowel gas identified. Is difficult to exclude free air on this supine view. No suspicious calcifications. There is contrast in the bladder. No acute fractures are identified. A surgical clip overlies the right lower quadrant. IMPRESSION: Unchanged gaseous dilated colon in the central and lower abdomen concerning for large bowel obstruction. Electronically Signed   By: Greig Pique M.D.   On: 08/07/2024 16:27   CT ABDOMEN PELVIS W CONTRAST Addendum Date: 08/07/2024 ADDENDUM REPORT: 08/07/2024 12:07 ADDENDUM: Above findings discussed with Dr. Saintclair as we discussed possibility of sigmoid volvulus. Electronically Signed   By: Toribio Agreste M.D.   On: 08/07/2024 12:07   Result Date: 08/07/2024 CLINICAL DATA:  Suspect bowel obstruction. Generalized abdominal pain with emesis and constipation. EXAM: CT ABDOMEN AND PELVIS WITH CONTRAST TECHNIQUE: Multidetector CT imaging of the abdomen and pelvis was performed using the standard protocol following bolus administration of intravenous contrast. RADIATION DOSE REDUCTION: This exam was performed according to the departmental dose-optimization program which includes automated exposure control, adjustment of the mA and/or kV according to patient size and/or use of iterative reconstruction technique. CONTRAST:  75mL OMNIPAQUE  IOHEXOL  350 MG/ML SOLN COMPARISON:  07/21/2024 FINDINGS: Lower chest: Mild stable cardiomegaly. Visualized lung bases are clear. Hepatobiliary: Gallbladder is contracted. Minimal low attenuation of the liver compatible with a degree of steatosis without focal mass. Biliary tree is unremarkable. Pancreas: Normal. Spleen: Normal. Adrenals/Urinary Tract: Adrenal glands are normal. Kidneys are normal in size without hydronephrosis or  nephrolithiasis. Ureters and bladder are normal. Stomach/Bowel: Stomach mildly distended with ingested contents. Proximal to mid small bowel is within normal. Several suture lines over the rectosigmoid colon. Moderate fecal retention over the rectosigmoid colon. Findings suggest subtotal colectomy. Sigmoid colon appears to extend into the right abdomen where there is a relative area of narrowing with associated twisting of the mesentery in the right abdomen just right of midline. The relative area of narrowing then becomes more prominent where there is another suture line over this bowel loop in the anterior mid abdomen as this measures approximately 13.3 cm in diameter and is unchanged compared to the prior exam. It is difficult to follow the bowel loops through this area with there is twisting of the mesentery. There is likely a degree of partial obstruction through this area of marrow bowel in the right abdomen although it is not completely obstructive. There is no evidence of free fluid, focal inflammatory change or pneumatosis. Vascular/Lymphatic: Minimal plaque over the abdominal aorta which is normal caliber. Remaining vascular structures are unremarkable. No adenopathy. Reproductive: Prostate is unremarkable. Other: None. Musculoskeletal: No focal abnormality. IMPRESSION: 1. Multiple surgical changes of the bowel at least reflecting subtotal colectomy with several suture lines  over the rectosigmoid colon. Sigmoid colon extends into the right abdomen where there is a relative area of narrowing with associated twisting of the mesentery. The relative narrowing then becomes dilated in the anterior mid abdomen measuring 13.3 cm in diameter as this is unchanged and likely represent a degree of partial obstruction. Recommend serial follow-up imaging. 2. Mild hepatic steatosis. 3. Aortic atherosclerosis. Aortic Atherosclerosis (ICD10-I70.0). Electronically Signed: By: Toribio Agreste M.D. On: 08/07/2024 09:12   DG  Chest Portable 1 View Result Date: 08/07/2024 CLINICAL DATA:  Shortness of breath. EXAM: PORTABLE CHEST 1 VIEW COMPARISON:  07/27/2024 FINDINGS: The cardio pericardial silhouette is enlarged. The lungs are clear without focal pneumonia, edema, pneumothorax or pleural effusion. No acute bony abnormality. Interval movable of right PICC line. Telemetry leads overlie the chest. IMPRESSION: Enlargement of the cardiopericardial silhouette. No acute cardiopulmonary findings. Electronically Signed   By: Camellia Candle M.D.   On: 08/07/2024 07:50    Anti-infectives: Anti-infectives (From admission, onward)    None        Assessment/Plan Bowel obstruction, ? Large bowel volvulus - Reports surgery in 1990s in [place]  for colon ca with colonic resection and ostomy followed by ostomy reversal. Colonoscopy from 2021 showed 3 possible anastomosis, at rectosigmoid, at 20 cm and again at ileocolonic anastomosis.  - CT 11/12 with Sigmoid colon appears to extend into the right abdomen where there is a relative area of narrowing with associated twisting of the mesentery in the right abdomen just right of midline. The relative area of narrowing then becomes more prominent where there is another suture line over this bowel loop in the anterior mid abdomen as this measures approximately 13.3 cm in diameter.  - Flex sig 11/12 w/  lumen of the rectum, recto- sigmoid colon and sigmoid colon moderately dilated. Decompression performed and post decompression abdomen was soft. - Xray today w/ improved bowel dilation - Unclear if more proximal obstruction causing CT findings that was unable to be visaluzed on flex sig - Cont NGT to LIWS - Afebrile. Tachycardia and hypotension resolved. WBC wnl. Lactic acid wnl on last check. No peritonitis on exam. No indication for emergency surgery - Discussed with GI. Agree with enemas today. Consider  gentle bowel prep w/ miralax tomorrow and possible colonoscopy after  prep - AM xray - We will follow with you  FEN - NPO, NGT to LIWS, IVF per [facility] VTE - SCDs, heparin gtt ID - None  - per admitting -  CHFrEF - TEE 11/3 with EF <20%, ECHO 10/28 with EF 25-30% A fib on eliquis - s/p cardioversion 11/6 COPD HTN Hx of alcohol abuse, now sober Hx of tobacco abuse Hx of right sided colon cancer s/p resection in the 1990s Hepatic cirrhosis   I reviewed nursing notes, Consultant (GI) notes, last 24 h vitals and pain scores, last 48 h intake and output, last 24 h labs and trends, and last 24 h imaging results. Discussed with GI in person.     LOS: 0 days    Ozell CHRISTELLA Shaper, [facility] 08/08/2024, 9:26 AM Please see Amion for pager number during day hours 7:00am-4:30pm

## 2024-08-09 ENCOUNTER — Inpatient Hospital Stay (HOSPITAL_COMMUNITY)

## 2024-08-09 DIAGNOSIS — K56600 Partial intestinal obstruction, unspecified as to cause: Secondary | ICD-10-CM | POA: Diagnosis not present

## 2024-08-09 LAB — BASIC METABOLIC PANEL WITH GFR
Anion gap: 11 (ref 5–15)
BUN: 27 mg/dL — ABNORMAL HIGH (ref 8–23)
CO2: 20 mmol/L — ABNORMAL LOW (ref 22–32)
Calcium: 9.2 mg/dL (ref 8.9–10.3)
Chloride: 108 mmol/L (ref 98–111)
Creatinine, Ser: 1.32 mg/dL — ABNORMAL HIGH (ref 0.61–1.24)
GFR, Estimated: >60 mL/min (ref >60)
Glucose, Bld: 91 mg/dL (ref 70–99)
Potassium: 4.3 mmol/L (ref 3.5–5.1)
Sodium: 139 mmol/L (ref 135–145)

## 2024-08-09 LAB — CBC
HCT: 44.9 % (ref 39.0–52.0)
Hemoglobin: 15 g/dL (ref 13.0–17.0)
MCH: 28.1 pg (ref 26.0–34.0)
MCHC: 33.4 g/dL (ref 30.0–36.0)
MCV: 84.2 fL (ref 80.0–100.0)
Platelets: 252 K/uL (ref 150–400)
RBC: 5.33 MIL/uL (ref 4.22–5.81)
RDW: 15.1 % (ref 11.5–15.5)
WBC: 6.7 K/uL (ref 4.0–10.5)
nRBC: 0 % (ref 0.0–0.2)

## 2024-08-09 LAB — HEPARIN LEVEL (UNFRACTIONATED): Heparin Unfractionated: 0.55 [IU]/mL (ref 0.30–0.70)

## 2024-08-09 MED ORDER — LACTATED RINGERS IV SOLN: INTRAVENOUS | Status: AC

## 2024-08-09 MED ORDER — POLYETHYLENE GLYCOL 3350 17 G PO PACK
17.0000 g | PACK | Freq: Three times a day (TID) | ORAL | Status: DC
  Administered 2024-08-09: 17 g via ORAL
  Filled 2024-08-09: qty 1

## 2024-08-09 MED ORDER — FLUTICASONE PROPIONATE 50 MCG/ACT NA SUSP: 1.0000 | Freq: Every day | NASAL | Status: DC | PRN

## 2024-08-09 MED ORDER — FLUTICASONE FUROATE-VILANTEROL 200-25 MCG/ACT IN AEPB
1.0000 | INHALATION_SPRAY | Freq: Every day | RESPIRATORY_TRACT | Status: DC
  Administered 2024-08-11 – 2024-08-13 (×3): 1 via RESPIRATORY_TRACT
  Filled 2024-08-09: qty 28

## 2024-08-09 MED ORDER — IOHEXOL 9 MG/ML PO SOLN
500.0000 mL | ORAL | Status: AC
Start: 2024-08-09 — End: 2024-08-09
  Administered 2024-08-09 (×2): 500 mL via ORAL

## 2024-08-09 NOTE — Progress Notes (Addendum)
 2 Days Post-Op  Subjective: CC: Reports BM after fleets enema yesterday. Had a large BM this am without enema. Reports he is passing flatus. Abdominal pain resolved. Has not required PRN pain medication this AM. No nausea. NGT w/ 800cc/24 hours.   Objective: Vital signs in last 24 hours: Temp:  [97.9 F (36.6 C)-98.1 F (36.7 C)] 98.1 F (36.7 C) (11/14 0850) Pulse Rate:  [51-76] 71 (11/14 0850) Resp:  [15-18] 16 (11/14 0755) BP: (109-127)/(70-92) 127/92 (11/14 0850) SpO2:  [98 %-100 %] 98 % (11/14 0850) Last BM Date : 08/08/24  Intake/Output from previous day: 11/13 0701 - 11/14 0700 In: 628.6 [I.V.:538.6; NG/GT:90] Out: 2200 [Urine:1400; Emesis/NG output:800] Intake/Output this shift: No intake/output data recorded.  PE: Gen:  Alert, NAD, pleasant Abd: Very soft, much less distended, completely NT. NGT to LIWS.   Lab Results:  Recent Labs    08/08/24 0447 08/09/24 0223  WBC 8.3 6.7  HGB 15.3 15.0  HCT 45.7 44.9  PLT 258 252   BMET Recent Labs    08/08/24 0447 08/09/24 0223  NA 134* 139  K 4.8 4.3  CL 108 108  CO2 17* 20*  GLUCOSE 106* 91  BUN 31* 27*  CREATININE 1.36* 1.32*  CALCIUM 8.8* 9.2   PT/INR No results for input(s): LABPROT, INR in the last 72 hours. CMP     Component Value Date/Time   NA 139 08/09/2024 0223   NA 141 02/28/2018 1538   K 4.3 08/09/2024 0223   CL 108 08/09/2024 0223   CO2 20 (L) 08/09/2024 0223   GLUCOSE 91 08/09/2024 0223   BUN 27 (H) 08/09/2024 0223   BUN 7 02/28/2018 1538   CREATININE 1.32 (H) 08/09/2024 0223   CALCIUM 9.2 08/09/2024 0223   PROT 6.9 08/08/2024 0447   PROT 7.3 06/08/2017 1115   ALBUMIN 3.2 (L) 08/08/2024 0447   ALBUMIN 4.6 06/08/2017 1115   AST 32 08/08/2024 0447   ALT 55 (H) 08/08/2024 0447   ALKPHOS 78 08/08/2024 0447   BILITOT 1.0 08/08/2024 0447   BILITOT 0.4 06/08/2017 1115   GFRNONAA >60 08/09/2024 0223   GFRAA >60 05/15/2019 1345   Lipase     Component Value Date/Time    LIPASE 46 08/07/2024 0306    Studies/Results: DG Abd Portable 1V Result Date: 08/09/2024 EXAM: 1 VIEW XRAY OF THE ABDOMEN 08/09/2024 05:50:18 AM COMPARISON: 08/08/2024, 08/07/2024 CLINICAL HISTORY: Bowel obstruction (HCC) FINDINGS: LINES, TUBES AND DEVICES: Enteric tube in place terminating in the region of the decompressed stomach. BOWEL: New, gaseously distended structure in the upper abdomen extending into the right upper quadrant. Nondistended segments of small bowel noted in the mid to lower abdomen. SOFT TISSUES: No opaque urinary calculi. BONES: No acute osseous abnormality. Scattered thoracolumbar osteophytosis. IMPRESSION: 1. Worsening, gaseously distended structure in the upper abdomen extending into the right upper quadrant, which corresponds to a significantly dilated segment of small bowel previously noted on the prior CT just upstream from the sigmoid anastomosis. Given the appearance on the prior CT, where there was twisting of the bowel mesentery in this region, a follow-up CT of the abdomen and pelvis with iv contrast is recommended for further evaluation. These results will be called to the ordering clinician or representative by the radiologist assistant and communication documented in the pacs or clario dashboard. Electronically signed by: Rogelia Myers MD 08/09/2024 08:44 AM EST RP Workstation: [id number]   DG Abd Portable 1V Result Date: 08/08/2024 EXAM: 1 VIEW XRAY OF THE ABDOMEN  08/08/2024 06:14:26 AM COMPARISON: Portable abdomen film yesterday at 5:20 pm. CLINICAL HISTORY: Ileus (HCC). FINDINGS: LINES, TUBES AND DEVICES: NGT is partially coiled in the proximal stomach. BOWEL: Mild gastric air distention is again noted. Small bowel distention in the upper abdomen is again seen up to 3.5 cm. There is aerated stool in the rectum. The degree of bowel dilatation slightly improved today. SOFT TISSUES: No opaque urinary calculi. BONES: No acute osseous abnormality. IMPRESSION: 1. Small  bowel distention in the upper abdomen up to 3.5 cm, slightly improved compared to the prior study. 2. Nasogastric tube partially coiled in the proximal stomach. 3. Mild gastric air distention. Electronically signed by: Francis Quam MD 08/08/2024 06:42 AM EST RP Workstation: [id number]   DG Abd Portable 1 View Result Date: 08/07/2024 CLINICAL DATA:  NG tube placement EXAM: PORTABLE ABDOMEN - 1 VIEW COMPARISON:  Radiograph and CT yesterday FINDINGS: Tip and side port of the enteric tube below the diaphragm in the stomach. Gaseous bowel distention in the upper abdomen, slightly diminished from yesterday. IMPRESSION: Tip and side port of the enteric tube below the diaphragm in the stomach. Electronically Signed   By: Andrea Gasman M.D.   On: 08/07/2024 17:49   DG Abd 1 View Result Date: 08/07/2024 CLINICAL DATA:  Sigmoid volvulus EXAM: ABDOMEN - 1 VIEW COMPARISON:  CT abdomen and pelvis 08/07/2024 FINDINGS: There is a dilated loop of colon in the left and central abdomen measuring up to 16 cm similar to prior study. There is also gaseous distention of colon in the central and lower abdomen. Minimal small bowel gas identified. Is difficult to exclude free air on this supine view. No suspicious calcifications. There is contrast in the bladder. No acute fractures are identified. A surgical clip overlies the right lower quadrant. IMPRESSION: Unchanged gaseous dilated colon in the central and lower abdomen concerning for large bowel obstruction. Electronically Signed   By: Greig Pique M.D.   On: 08/07/2024 16:27    Anti-infectives: Anti-infectives (From admission, onward)    None        Assessment/Plan Bowel obstruction, ? Large bowel volvulus - Reports surgery in 1990s in [place]  for colon ca with colonic resection and ostomy followed by ostomy reversal. Colonoscopy from 2021 showed 3 possible anastomosis, at rectosigmoid, at 20 cm and again at ileocolonic anastomosis.  - CT 11/12  with Sigmoid colon appears to extend into the right abdomen where there is a relative area of narrowing with associated twisting of the mesentery in the right abdomen just right of midline. The relative area of narrowing then becomes more prominent where there is another suture line over this bowel loop in the anterior mid abdomen as this measures approximately 13.3 cm in diameter.  - Flex sig 11/12 w/ lumen of the rectum, recto- sigmoid colon and sigmoid colon moderately dilated. Decompression performed and post decompression abdomen was soft. - Unclear if more proximal obstruction causing CT findings that was unable to be visaluzed on flex sig - Xray today with worsening, gaseously distended structure in the upper abdomen extending into the right upper quadrant. However clinically he appears improved with no abdominal pain, return of bowel function, and on exam he is very soft, NT and without peritonitis. Given clinical improvement and he is HDS without fever, tachycardia or hypotension, his WBC is wnl and lactic acid was wnl on last check, I do not think he needs emergency surgery.  - Noted GI plans for Miralax via NGT w/ intermittent NGT clamping today,  repeat xray in the AM and possible colonoscopy in near future if able to tolerate prep. Agree with this plan.  - We will follow with you  Addendum: Notified by GI that they are getting repeat CT A/P. We will follow up on this.   FEN - NPO, NGT to LIWS, IVF per [facility] VTE - SCDs, heparin gtt ID - None  - per admitting -  CHFrEF - TEE 11/3 with EF <20%, ECHO 10/28 with EF 25-30% A fib on eliquis - s/p cardioversion 11/6 COPD HTN Hx of alcohol abuse, now sober Hx of tobacco abuse Hx of right sided colon cancer s/p resection in the 1990s Hepatic cirrhosis   I reviewed nursing notes, Consultant (GI) notes, last 24 h vitals and pain scores, last 48 h intake and output, last 24 h labs and trends, and last 24 h imaging results. Discussed with GI in  person.     LOS: 1 day    Benjamin Abbott, [facility] 08/09/2024, 10:06 AM Please see Amion for pager number during day hours 7:00am-4:30pm

## 2024-08-09 NOTE — Plan of Care (Signed)
   Problem: Activity: Goal: Risk for activity intolerance will decrease Outcome: Progressing   Problem: Pain Managment: Goal: General experience of comfort will improve and/or be controlled Outcome: Progressing   Problem: Safety: Goal: Ability to remain free from injury will improve Outcome: Progressing

## 2024-08-09 NOTE — Progress Notes (Signed)
 Subjective: Had a bowel movement after Fleet enema yesterday.  Denies abdominal pain.  Objective: Vital signs in last 24 hours: Temp:  [97.9 F (36.6 C)-98.1 F (36.7 C)] 97.9 F (36.6 C) (11/14 0400) Pulse Rate:  [51-76] 76 (11/14 0400) Resp:  [15-18] 15 (11/14 0400) BP: (109-121)/(70-90) 109/90 (11/14 0400) SpO2:  [98 %-100 %] 98 % (11/14 0400) Weight change:  Last BM Date : 08/08/24  PE: Not in distress GENERAL: NG tube connected to low intermittent wall suction, 800 cc of bilious fluid removed in 24 hours  ABDOMEN: Soft, nondistended, nontender, normoactive bowel sounds EXTREMITIES: No deformity  Lab Results: Results for orders placed or performed during the hospital encounter of 08/07/24 (from the past 48 hours)  Urinalysis, Routine w reflex microscopic -Urine, Clean Catch     Status: Abnormal   Collection Time: 08/07/24  8:35 AM  Result Value Ref Range   Color, Urine STRAW (A) YELLOW   APPearance CLEAR CLEAR   Specific Gravity, Urine 1.012 1.005 - 1.030   pH 5.0 5.0 - 8.0   Glucose, UA 150 (A) NEGATIVE mg/dL   Hgb urine dipstick NEGATIVE NEGATIVE   Bilirubin Urine NEGATIVE NEGATIVE   Ketones, ur NEGATIVE NEGATIVE mg/dL   Protein, ur NEGATIVE NEGATIVE mg/dL   Nitrite NEGATIVE NEGATIVE   Leukocytes,Ua NEGATIVE NEGATIVE    Comment: Performed at [facility], [address], [place], [place] [address]  Troponin I (High Sensitivity)     Status: Abnormal   Collection Time: 08/07/24  9:13 AM  Result Value Ref Range   Troponin I (High Sensitivity) 19 (H) <18 ng/L    Comment: (NOTE) Elevated high sensitivity troponin I (hsTnI) values and significant  changes across serial measurements may suggest ACS but many other  chronic and acute conditions are known to elevate hsTnI results.  Refer to the Links section for chest pain algorithms and additional  guidance. Performed at [facility], [address], [place], [place] [address]   Lactic acid, plasma      Status: None   Collection Time: 08/07/24 10:08 AM  Result Value Ref Range   Lactic Acid, Venous 1.0 0.5 - 1.9 mmol/L    Comment: Performed at [facility], [address] [place], [place] [address]  Heparin level (unfractionated)     Status: None   Collection Time: 08/07/24  7:56 PM  Result Value Ref Range   Heparin Unfractionated 0.39 0.30 - 0.70 IU/mL    Comment: (NOTE) The clinical reportable range upper limit is being lowered to >1.10 to align with the FDA approved guidance for the current laboratory assay.  If heparin results are below expected values, and patient dosage has  been confirmed, suggest follow up testing of antithrombin III levels. Performed at [facility], [address] [place], [place] [address]   APTT     Status: Abnormal   Collection Time: 08/07/24  7:56 PM  Result Value Ref Range   aPTT 44 (H) 24 - 36 seconds    Comment:        IF BASELINE aPTT IS ELEVATED, SUGGEST PATIENT RISK ASSESSMENT BE USED TO DETERMINE APPROPRIATE ANTICOAGULANT THERAPY. Performed at [facility], [address] [place], [place] [address]   CBC     Status: None   Collection Time: 08/08/24  4:47 AM  Result Value Ref Range   WBC 8.3 4.0 - 10.5 K/uL   RBC 5.37 4.22 - 5.81 MIL/uL   Hemoglobin 15.3  13.0 - 17.0 g/dL   HCT 54.2 60.9 - 47.9 %   MCV 85.1 80.0 - 100.0 fL   MCH 28.5 26.0 - 34.0 pg   MCHC 33.5 30.0 - 36.0 g/dL   RDW 84.5 88.4 - 84.4 %   Platelets 258 150 - 400 K/uL   nRBC 0.0 0.0 - 0.2 %    Comment: Performed at [facility], [address], [place], [place] [address]  Comprehensive metabolic panel     Status: Abnormal   Collection Time: 08/08/24  4:47 AM  Result Value Ref Range   Sodium 134 (L) 135 - 145 mmol/L   Potassium 4.8 3.5 - 5.1 mmol/L   Chloride 108 98 - 111 mmol/L   CO2 17 (L) 22 - 32 mmol/L   Glucose, Bld 106 (H) 70 - 99 mg/dL    Comment: Glucose reference range applies only to samples taken after fasting for at least 8  hours.   BUN 31 (H) 8 - 23 mg/dL   Creatinine, Ser 8.63 (H) 0.61 - 1.24 mg/dL   Calcium 8.8 (L) 8.9 - 10.3 mg/dL   Total Protein 6.9 6.5 - 8.1 g/dL   Albumin 3.2 (L) 3.5 - 5.0 g/dL   AST 32 15 - 41 U/L   ALT 55 (H) 0 - 44 U/L   Alkaline Phosphatase 78 38 - 126 U/L   Total Bilirubin 1.0 0.0 - 1.2 mg/dL   GFR, Estimated 59 (L) >60 mL/min    Comment: (NOTE) Calculated using the CKD-EPI Creatinine Equation (2021)    Anion gap 9 5 - 15    Comment: Performed at [facility], [address], [place], [place] [address]  APTT     Status: Abnormal   Collection Time: 08/08/24  4:47 AM  Result Value Ref Range   aPTT 67 (H) 24 - 36 seconds    Comment:        IF BASELINE aPTT IS ELEVATED, SUGGEST PATIENT RISK ASSESSMENT BE USED TO DETERMINE APPROPRIATE ANTICOAGULANT THERAPY. Performed at [facility], [address] [place], [place] [address]   Heparin level (unfractionated)     Status: None   Collection Time: 08/08/24  4:47 AM  Result Value Ref Range   Heparin Unfractionated 0.69 0.30 - 0.70 IU/mL    Comment: (NOTE) The clinical reportable range upper limit is being lowered to >1.10 to align with the FDA approved guidance for the current laboratory assay.  If heparin results are below expected values, and patient dosage has  been confirmed, suggest follow up testing of antithrombin III levels. Performed at [facility], [address] [place], [place] [address]   CBC     Status: None   Collection Time: 08/09/24  2:23 AM  Result Value Ref Range   WBC 6.7 4.0 - 10.5 K/uL   RBC 5.33 4.22 - 5.81 MIL/uL   Hemoglobin 15.0 13.0 - 17.0 g/dL   HCT 55.0 60.9 - 47.9 %   MCV 84.2 80.0 - 100.0 fL   MCH 28.1 26.0 - 34.0 pg   MCHC 33.4 30.0 - 36.0 g/dL   RDW 84.8 88.4 - 84.4 %   Platelets 252 150 - 400 K/uL   nRBC 0.0 0.0 - 0.2 %    Comment: Performed at [facility], [address], [place], [place] [address]  Heparin level (unfractionated)     Status: None    Collection Time: 08/09/24  2:23 AM  Result Value Ref  Range   Heparin Unfractionated 0.55 0.30 - 0.70 IU/mL    Comment: (NOTE) The clinical reportable range upper limit is being lowered to >1.10 to align with the FDA approved guidance for the current laboratory assay.  If heparin results are below expected values, and patient dosage has  been confirmed, suggest follow up testing of antithrombin III levels. Performed at [facility], [address] [place], [place] [address]   Basic metabolic panel     Status: Abnormal   Collection Time: 08/09/24  2:23 AM  Result Value Ref Range   Sodium 139 135 - 145 mmol/L   Potassium 4.3 3.5 - 5.1 mmol/L   Chloride 108 98 - 111 mmol/L   CO2 20 (L) 22 - 32 mmol/L   Glucose, Bld 91 70 - 99 mg/dL    Comment: Glucose reference range applies only to samples taken after fasting for at least 8 hours.   BUN 27 (H) 8 - 23 mg/dL   Creatinine, Ser 8.67 (H) 0.61 - 1.24 mg/dL   Calcium 9.2 8.9 - 89.6 mg/dL   GFR, Estimated >39 >39 mL/min    Comment: (NOTE) Calculated using the CKD-EPI Creatinine Equation (2021)    Anion gap 11 5 - 15    Comment: Performed at [facility], [address], [place], [place] [address]    Studies/Results: [place] Abd Portable 1V Result Date: 08/08/2024 EXAM: 1 VIEW XRAY OF THE ABDOMEN 08/08/2024 06:14:26 AM COMPARISON: Portable abdomen film yesterday at 5:20 pm. CLINICAL HISTORY: Ileus (HCC). FINDINGS: LINES, TUBES AND DEVICES: NGT is partially coiled in the proximal stomach. BOWEL: Mild gastric air distention is again noted. Small bowel distention in the upper abdomen is again seen up to 3.5 cm. There is aerated stool in the rectum. The degree of bowel dilatation slightly improved today. SOFT TISSUES: No opaque urinary calculi. BONES: No acute osseous abnormality. IMPRESSION: 1. Small bowel distention in the upper abdomen up to 3.5 cm, slightly improved compared to the prior study. 2. Nasogastric tube partially coiled in  the proximal stomach. 3. Mild gastric air distention. Electronically signed by: Francis Quam MD 08/08/2024 06:42 AM EST RP Workstation: [id number]   DG Abd Portable 1 View Result Date: 08/07/2024 CLINICAL DATA:  NG tube placement EXAM: PORTABLE ABDOMEN - 1 VIEW COMPARISON:  Radiograph and CT yesterday FINDINGS: Tip and side port of the enteric tube below the diaphragm in the stomach. Gaseous bowel distention in the upper abdomen, slightly diminished from yesterday. IMPRESSION: Tip and side port of the enteric tube below the diaphragm in the stomach. Electronically Signed   By: Andrea Gasman M.D.   On: 08/07/2024 17:49   DG Abd 1 View Result Date: 08/07/2024 CLINICAL DATA:  Sigmoid volvulus EXAM: ABDOMEN - 1 VIEW COMPARISON:  CT abdomen and pelvis 08/07/2024 FINDINGS: There is a dilated loop of colon in the left and central abdomen measuring up to 16 cm similar to prior study. There is also gaseous distention of colon in the central and lower abdomen. Minimal small bowel gas identified. Is difficult to exclude free air on this supine view. No suspicious calcifications. There is contrast in the bladder. No acute fractures are identified. A surgical clip overlies the right lower quadrant. IMPRESSION: Unchanged gaseous dilated colon in the central and lower abdomen concerning for large bowel obstruction. Electronically Signed   By: Greig Pique M.D.   On: 08/07/2024 16:27   CT ABDOMEN PELVIS W CONTRAST Addendum Date: 08/07/2024 ADDENDUM REPORT: 08/07/2024 12:07 ADDENDUM:  Above findings discussed with Dr. Saintclair as we discussed possibility of sigmoid volvulus. Electronically Signed   By: Toribio Agreste M.D.   On: 08/07/2024 12:07   Result Date: 08/07/2024 CLINICAL DATA:  Suspect bowel obstruction. Generalized abdominal pain with emesis and constipation. EXAM: CT ABDOMEN AND PELVIS WITH CONTRAST TECHNIQUE: Multidetector CT imaging of the abdomen and pelvis was performed using the standard protocol  following bolus administration of intravenous contrast. RADIATION DOSE REDUCTION: This exam was performed according to the departmental dose-optimization program which includes automated exposure control, adjustment of the mA and/or kV according to patient size and/or use of iterative reconstruction technique. CONTRAST:  75mL OMNIPAQUE  IOHEXOL  350 MG/ML SOLN COMPARISON:  07/21/2024 FINDINGS: Lower chest: Mild stable cardiomegaly. Visualized lung bases are clear. Hepatobiliary: Gallbladder is contracted. Minimal low attenuation of the liver compatible with a degree of steatosis without focal mass. Biliary tree is unremarkable. Pancreas: Normal. Spleen: Normal. Adrenals/Urinary Tract: Adrenal glands are normal. Kidneys are normal in size without hydronephrosis or nephrolithiasis. Ureters and bladder are normal. Stomach/Bowel: Stomach mildly distended with ingested contents. Proximal to mid small bowel is within normal. Several suture lines over the rectosigmoid colon. Moderate fecal retention over the rectosigmoid colon. Findings suggest subtotal colectomy. Sigmoid colon appears to extend into the right abdomen where there is a relative area of narrowing with associated twisting of the mesentery in the right abdomen just right of midline. The relative area of narrowing then becomes more prominent where there is another suture line over this bowel loop in the anterior mid abdomen as this measures approximately 13.3 cm in diameter and is unchanged compared to the prior exam. It is difficult to follow the bowel loops through this area with there is twisting of the mesentery. There is likely a degree of partial obstruction through this area of marrow bowel in the right abdomen although it is not completely obstructive. There is no evidence of free fluid, focal inflammatory change or pneumatosis. Vascular/Lymphatic: Minimal plaque over the abdominal aorta which is normal caliber. Remaining vascular structures are  unremarkable. No adenopathy. Reproductive: Prostate is unremarkable. Other: None. Musculoskeletal: No focal abnormality. IMPRESSION: 1. Multiple surgical changes of the bowel at least reflecting subtotal colectomy with several suture lines over the rectosigmoid colon. Sigmoid colon extends into the right abdomen where there is a relative area of narrowing with associated twisting of the mesentery. The relative narrowing then becomes dilated in the anterior mid abdomen measuring 13.3 cm in diameter as this is unchanged and likely represent a degree of partial obstruction. Recommend serial follow-up imaging. 2. Mild hepatic steatosis. 3. Aortic atherosclerosis. Aortic Atherosclerosis (ICD10-I70.0). Electronically Signed: By: Toribio Agreste M.D. On: 08/07/2024 09:12    Medications: I have reviewed the patient's current medications.  Assessment: Bowel obstruction, small bowel versus large bowel Flexible sigmoidoscopy performed for decompression but visualization extremely limited due to poor prep Being managed conservatively with NG tube placement and has received 1 dose of Fleet enema  Lab abnormalities: BUN 27, creatinine 1.32, low albumin 3.2, slightly elevated ALT 55  Plan: Official read on abdominal x-ray pending, dilated bowel loops noted.  Will give MiraLAX 17 g x 3 via NG tube today, NG tube to be clamped for 30 minutes after giving MiraLAX, thereafter to be reconnected to low intermittent wall suction.  Repeat abdominal x-ray in a.m. tomorrow.  If MiraLAX is tolerated and there is improvement in bowel distention, we can consider removing NG tube tomorrow, and possibly giving him more prep, in anticipation of colonoscopy.  Estelita Manas, MD 08/09/2024, 7:48 AM

## 2024-08-09 NOTE — Progress Notes (Addendum)
 PROGRESS NOTE   Benjamin Abbott  FMW:[id number]    DOB: 12-31-1960    DOA: 08/07/2024  PCP: Leontine Cramp, NP   I have briefly reviewed patients previous medical records in [facility].   Brief Hospital Course:  63 year old male with medical history significant for colon cancer, s/p multiple surgeries, right colectomy, ostomy followed by ostomy reversal, prior colonoscopy showing 3 possible anastomosis, HTN, PAF on DOAC, COPD, alcohol and tobacco use disorder, presented to the ED with complaints of abdominal pain, nausea and vomiting.  CT abdomen concerning for sigmoid volvulus.  Underwent emergent sigmoidoscopy without typical changes of volvulus, probably prepped bowel.  Follow-up abdominal x-ray showing small bowel distention.  Plan on bowel prep including enemas, MiraLAX via NG tube, serial abdominal x-rays and consideration for colonoscopy.  [facility] following.  Bowel obstruction is clinically better but radiologically persistent or worse.  Ongoing NG tube, n.p.o., getting CT A/P for further evaluation.   Assessment & Plan:   Bowel obstruction, LBO versus SBO History as noted above.  History of multiple abdominal surgeries. [facility] consultation and follow-up appreciated. Emergent sigmoidoscopy 11/12 did not show typical features for sigmoid volvulus but the bowel prep was poor. Continuing n.p.o., NG tube, IVF, plan on enemas, MiraLAX enterally if tolerated, follow clinically and with serial abdominal x-rays with consideration for colonoscopy. Over the last 24 hours, patient reports 2 enemas with reasonable BMs followed by spontaneous BM overnight.  Denies abdominal pain or distention.  No nausea or vomiting. X-ray 11/14 however shows worsening gaseous distention in the upper abdomen.  Patient is clinically better but radiologically worse.  GI getting CT abdomen and pelvis with IV contrast and treating with MiraLAX via NG tube followed by intermittent wall suction.   Follow-up x-ray abdomen in AM.  Acute kidney injury complicating stage IIIa CKD NAGMA Baseline creatinine probably in the 1.3-1.4 range.  Presented with creatinine of 1.8.  Suspected due to poor oral intake, nausea and vomiting AKI resolved after IV fluids.  Creatinine back to baseline 1.36 > 1.32. Avoid hypotension and nephrotoxic meds.  Continue to trend daily BMP.  Is going to get CT contrast today and will need to follow BMP closely.  Paroxysmal atrial fibrillation Rate controlled on p.o. amiodarone and digoxin, continue n.p.o. except meds and NG tube clamping for an hour post meds Eliquis held in case warrants procedures.  Continue IV heparin bridging per pharmacy. Underwent cardioversion during last hospitalization on 11/3 and 11/6 but reverted back to A-fib.  Chronic systolic CHF Remains clinically euvolemic. 2D echo 11/3: LVEF <20%, LV global hypokinesis. Given severe systolic dysfunction, careful with IVF, gentle IVF and close monitoring. Holding Entresto and diuretics for now.  Hepatic cirrhosis Noted  Asthma Stable  History of alcohol and tobacco use disorder Reports that he quit smoking and drinking about 6 months ago.  History of colon cancer History as noted above.   Body mass index is 27.71 kg/m.   DVT prophylaxis:   On IV heparin drip   Code Status: Full Code:  Family Communication: None at bedside Disposition:  Inpatient appropriate since still with bowel obstruction, n.p.o., IV fluids etc.  If     Consultants:   [facility]  Procedures:   NG tube to low wall intermittent suction  Subjective:  History as noted above.  Patient reports an enema in the daytime followed by nighttime with good BMs and then no BM after that spontaneously.  Denies abdominal pain or distention.  No nausea or vomiting.  No chest pain or dyspnea.  Objective:   Vitals:   08/08/24 2000 08/09/24 0400 08/09/24 0755 08/09/24 0850  BP: 119/70 (!) 109/90 109/80  (!) 127/92  Pulse:  76 68 71  Resp: 16 15 16    Temp: 98.1 F (36.7 C) 97.9 F (36.6 C) 98 F (36.7 C) 98.1 F (36.7 C)  TempSrc: Oral Oral Oral Oral  SpO2: 99% 98% 98% 98%  Weight:      Height:        General exam: Young male, moderately built and nourished lying comfortably propped up in bed without distress.  Nursing at bedside. Respiratory system: Clear to auscultation. Respiratory effort normal.  Stable. Cardiovascular system: S1 & S2 heard, irregularly irregular. No JVD, murmurs, rubs, gallops or clicks. No pedal edema.  Telemetry personally reviewed: A-fib with controlled ventricular rate.  Stable. Gastrointestinal system: Midline laparotomy scar and surgical scar in the RLQ.  Abdomen is mildly distended but soft and benign without tenderness, rigidity, guarding or rebound.  No organomegaly or masses appreciated.  Few bowel sounds heard. Central nervous system: Alert and oriented. No focal neurological deficits. Extremities: Symmetric 5 x 5 power. Skin: No rashes, lesions or ulcers Psychiatry: Judgement and insight appear normal. Mood & affect appropriate.     Data Reviewed:   I have personally reviewed following labs and imaging studies   CBC: Recent Labs  Lab 08/07/24 0306 08/08/24 0447 08/09/24 0223  WBC 8.9 8.3 6.7  HGB 15.5 15.3 15.0  HCT 47.8 45.7 44.9  MCV 86.6 85.1 84.2  PLT 257 258 252    Basic Metabolic Panel: Recent Labs  Lab 08/07/24 0306 08/08/24 0447 08/09/24 0223  NA 135 134* 139  K 4.8 4.8 4.3  CL 105 108 108  CO2 17* 17* 20*  GLUCOSE 110* 106* 91  BUN 33* 31* 27*  CREATININE 1.80* 1.36* 1.32*  CALCIUM 9.3 8.8* 9.2    Liver Function Tests: Recent Labs  Lab 08/07/24 0306 08/08/24 0447  AST 33 32  ALT 56* 55*  ALKPHOS 83 78  BILITOT 0.6 1.0  PROT 7.5 6.9  ALBUMIN 3.6 3.2*    CBG: No results for input(s): GLUCAP in the last 168 hours.  Microbiology Studies:  No results found for this or any previous visit (from the past  240 hours).  Radiology Studies:  DG Abd Portable 1V Result Date: 08/09/2024 EXAM: 1 VIEW XRAY OF THE ABDOMEN 08/09/2024 05:50:18 AM COMPARISON: 08/08/2024, 08/07/2024 CLINICAL HISTORY: Bowel obstruction (HCC) FINDINGS: LINES, TUBES AND DEVICES: Enteric tube in place terminating in the region of the decompressed stomach. BOWEL: New, gaseously distended structure in the upper abdomen extending into the right upper quadrant. Nondistended segments of small bowel noted in the mid to lower abdomen. SOFT TISSUES: No opaque urinary calculi. BONES: No acute osseous abnormality. Scattered thoracolumbar osteophytosis. IMPRESSION: 1. Worsening, gaseously distended structure in the upper abdomen extending into the right upper quadrant, which corresponds to a significantly dilated segment of small bowel previously noted on the prior CT just upstream from the sigmoid anastomosis. Given the appearance on the prior CT, where there was twisting of the bowel mesentery in this region, a follow-up CT of the abdomen and pelvis with iv contrast is recommended for further evaluation. These results will be called to the ordering clinician or representative by the radiologist assistant and communication documented in the pacs or clario dashboard. Electronically signed by: Rogelia Myers MD 08/09/2024 08:44 AM EST RP Workstation: [id number]  Abd Portable 1V Result Date: 08/08/2024 EXAM: 1 VIEW XRAY OF THE ABDOMEN 08/08/2024 06:14:26 AM COMPARISON: Portable abdomen film yesterday at 5:20 pm. CLINICAL HISTORY: Ileus (HCC). FINDINGS: LINES, TUBES AND DEVICES: NGT is partially coiled in the proximal stomach. BOWEL: Mild gastric air distention is again noted. Small bowel distention in the upper abdomen is again seen up to 3.5 cm. There is aerated stool in the rectum. The degree of bowel dilatation slightly improved today. SOFT TISSUES: No opaque urinary calculi. BONES: No acute osseous abnormality. IMPRESSION: 1. Small bowel  distention in the upper abdomen up to 3.5 cm, slightly improved compared to the prior study. 2. Nasogastric tube partially coiled in the proximal stomach. 3. Mild gastric air distention. Electronically signed by: Francis Quam MD 08/08/2024 06:42 AM EST RP Workstation: [id number]   DG Abd Portable 1 View Result Date: 08/07/2024 CLINICAL DATA:  NG tube placement EXAM: PORTABLE ABDOMEN - 1 VIEW COMPARISON:  Radiograph and CT yesterday FINDINGS: Tip and side port of the enteric tube below the diaphragm in the stomach. Gaseous bowel distention in the upper abdomen, slightly diminished from yesterday. IMPRESSION: Tip and side port of the enteric tube below the diaphragm in the stomach. Electronically Signed   By: Andrea Gasman M.D.   On: 08/07/2024 17:49   DG Abd 1 View Result Date: 08/07/2024 CLINICAL DATA:  Sigmoid volvulus EXAM: ABDOMEN - 1 VIEW COMPARISON:  CT abdomen and pelvis 08/07/2024 FINDINGS: There is a dilated loop of colon in the left and central abdomen measuring up to 16 cm similar to prior study. There is also gaseous distention of colon in the central and lower abdomen. Minimal small bowel gas identified. Is difficult to exclude free air on this supine view. No suspicious calcifications. There is contrast in the bladder. No acute fractures are identified. A surgical clip overlies the right lower quadrant. IMPRESSION: Unchanged gaseous dilated colon in the central and lower abdomen concerning for large bowel obstruction. Electronically Signed   By: Greig Pique M.D.   On: 08/07/2024 16:27    Scheduled Meds:    amiodarone  200 mg Oral BID   digoxin  0.125 mg Oral Daily   empagliflozin  10 mg Oral Daily   gabapentin   300 mg Oral TID   hydrOXYzine  25 mg Oral QHS   pneumococcal 20-valent conjugate vaccine  0.5 mL Intramuscular Tomorrow-1000   sodium chloride  flush  3 mL Intravenous Q12H    Continuous Infusions:    heparin 1,250 Units/hr (08/09/24 0740)   lactated ringers  50 mL/hr  at 08/08/24 1834     LOS: 1 day     Trenda Mar, MD,  FACP, [facility], [facility], [facility]   [facility] [place]      To contact the attending provider between 7A-7P or the covering provider during after hours 7P-7A, please log into the web site [email or web address] and access using universal [place] password for that web site. If you do not have the password, please call the hospital operator.  08/09/2024, 11:22 AM

## 2024-08-09 NOTE — Progress Notes (Signed)
 Gave scheduled meds w prn tylenol , clamped NGT for 30 mins. Gave fleet enema-pt was able to hold for almost , and had med size BM. Majority was watery, brown w some solid pieces. Pt passing gas

## 2024-08-09 NOTE — Progress Notes (Signed)
 PHARMACY - ANTICOAGULATION CONSULT NOTE  Pharmacy Consult for heparin  Indication: atrial fibrillation  Allergies  Allergen Reactions   Gadolinium Derivatives Shortness Of Breath    Bronchospasms-Pt asthmatic and had albuterol  inhaler, which he used and it broke the spasms. 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,RN    Patient Measurements: Height: 5' 9 (175.3 cm) Weight: 85.1 kg (187 lb 9.8 oz) IBW/kg (Calculated) : 70.7 HEPARIN DW (KG): 85.1  Vital Signs: Temp: 98 F (36.7 C) (11/14 0755) Temp Source: Oral (11/14 0755) BP: 109/80 (11/14 0755) Pulse Rate: 68 (11/14 0755)  Labs: Recent Labs    08/07/24 0306 08/07/24 0734 08/07/24 0913 08/07/24 1956 08/08/24 0447 08/09/24 0223  HGB 15.5  --   --   --  15.3 15.0  HCT 47.8  --   --   --  45.7 44.9  PLT 257  --   --   --  258 252  APTT  --   --   --  44* 67*  --   HEPARINUNFRC  --   --   --  0.39 0.69 0.55  CREATININE 1.80*  --   --   --  1.36* 1.32*  TROPONINIHS  --  17 19*  --   --   --     Estimated Creatinine Clearance: 63.6 mL/min (A) (by C-G formula based on SCr of 1.32 mg/dL (H)).  Assessment: Patient presented with CC of abdominal pain and emesis. Found to have potential SBO and possible sigmoid volvulus. Potential for sigmoid colectomy. Known hx of Afib on Eliquis PTA, last dose 11/11 @ noon. Pharmacy consulted to dose heparin gtt in peri-op time period.   Heparin level therapeutic at 0.55 on 1250 units/hr. No bleeding noted, CBC stable.  Goal of Therapy:  Heparin level 0.3-0.7 units/ml Monitor platelets by anticoagulation protocol: Yes   Plan:  Continue heparin infusion at 1250 units/hr   F/u daily heparin level, cbc and s/sx of bleeding F/u plans to transition back to Eliquis  Thank you for involving pharmacy in this patient's care.  Delon Sax, PharmD, BCPS Clinical Pharmacist Clinical phone for 08/09/2024 is [phone] 08/09/2024 8:25 AM

## 2024-08-10 ENCOUNTER — Encounter (HOSPITAL_COMMUNITY): Payer: Self-pay | Admitting: Gastroenterology

## 2024-08-10 ENCOUNTER — Inpatient Hospital Stay (HOSPITAL_COMMUNITY)

## 2024-08-10 DIAGNOSIS — K56600 Partial intestinal obstruction, unspecified as to cause: Secondary | ICD-10-CM | POA: Diagnosis not present

## 2024-08-10 LAB — CBC
HCT: 43.1 % (ref 39.0–52.0)
Hemoglobin: 14.6 g/dL (ref 13.0–17.0)
MCH: 28.4 pg (ref 26.0–34.0)
MCHC: 33.9 g/dL (ref 30.0–36.0)
MCV: 83.9 fL (ref 80.0–100.0)
Platelets: 244 K/uL (ref 150–400)
RBC: 5.14 MIL/uL (ref 4.22–5.81)
RDW: 14.6 % (ref 11.5–15.5)
WBC: 8.2 K/uL (ref 4.0–10.5)
nRBC: 0 % (ref 0.0–0.2)

## 2024-08-10 LAB — BASIC METABOLIC PANEL WITH GFR
Anion gap: 12 (ref 5–15)
BUN: 24 mg/dL — ABNORMAL HIGH (ref 8–23)
CO2: 21 mmol/L — ABNORMAL LOW (ref 22–32)
Calcium: 9.1 mg/dL (ref 8.9–10.3)
Chloride: 102 mmol/L (ref 98–111)
Creatinine, Ser: 1.34 mg/dL — ABNORMAL HIGH (ref 0.61–1.24)
GFR, Estimated: >60 mL/min (ref >60)
Glucose, Bld: 73 mg/dL (ref 70–99)
Potassium: 3.9 mmol/L (ref 3.5–5.1)
Sodium: 135 mmol/L (ref 135–145)

## 2024-08-10 LAB — HEPARIN LEVEL (UNFRACTIONATED): Heparin Unfractionated: 0.41 [IU]/mL (ref 0.30–0.70)

## 2024-08-10 MED ORDER — LACTATED RINGERS IV SOLN: INTRAVENOUS | Status: AC

## 2024-08-10 MED ORDER — MENTHOL 3 MG MT LOZG
1.0000 | LOZENGE | OROMUCOSAL | Status: DC | PRN
  Administered 2024-08-10 – 2024-08-12 (×4): 3 mg via ORAL
  Filled 2024-08-10 (×4): qty 9

## 2024-08-10 MED ORDER — IOHEXOL 350 MG/ML SOLN
75.0000 mL | Freq: Once | INTRAVENOUS | Status: AC | PRN
  Administered 2024-08-10: 75 mL via INTRAVENOUS

## 2024-08-10 NOTE — Plan of Care (Signed)

## 2024-08-10 NOTE — Progress Notes (Addendum)
 PROGRESS NOTE   Benjamin Abbott  FMW:[id number]    DOB: 04/16/1961    DOA: 08/07/2024  PCP: Leontine Cramp, NP   I have briefly reviewed patients previous medical records in [facility]   Brief Hospital Course:  63 year old male with medical history significant for colon cancer, s/p multiple surgeries, right colectomy, ostomy followed by ostomy reversal, prior colonoscopy showing 3 possible anastomosis, HTN, PAF on DOAC, COPD, alcohol and tobacco use disorder, presented to the ED with complaints of abdominal pain, nausea and vomiting.  CT abdomen concerning for sigmoid volvulus.  Underwent emergent sigmoidoscopy without typical changes of volvulus, probably prepped bowel.  Follow-up abdominal x-ray showing small bowel distention.  Plan on bowel prep including enemas, MiraLAX via NG tube, serial abdominal x-rays and consideration for colonoscopy.  [facility] following.  Bowel obstruction is clinically better but radiologically persistent or worse.  Ongoing NG tube, n.p.o.  CT A/P and follow-up abdominal x-ray this morning with persistent bowel distention.   Assessment & Plan:   Bowel obstruction, LBO versus SBO History as noted above.  History of multiple abdominal surgeries. [facility] consultation and follow-up appreciated. Emergent sigmoidoscopy 11/12 did not show typical features for sigmoid volvulus but the bowel prep was poor. Treated supportively with bowel rest n.p.o., NG tube to low intermittent wall suction, IVF, enemas, MiraLAX. Clinically doing better.  Has had several BMs in the last 24 hours.  No abdominal pain or distention.  However, CT A/P and follow-up abdominal x-ray this morning with persistent bowel distention.  However did have 400 mL NG tube output in the last 24 hours.  Await [facility]CS and GI follow-up for recommendations. Will need to consider parenteral nutrition if continued NPO.  Acute kidney injury complicating stage IIIa CKD NAGMA Baseline creatinine  probably in the 1.3-1.4 range.  Presented with creatinine of 1.8.  Suspected due to poor oral intake, nausea and vomiting AKI resolved after IV fluids.  Creatinine stable at her baseline of 1.3. Avoid hypotension and nephrotoxic meds.  Continue to trend daily BMP.  Paroxysmal atrial fibrillation Rate controlled on p.o. amiodarone and digoxin, continue n.p.o. except meds and NG tube clamping for an hour post meds Eliquis held in case warrants procedures.  Continue IV heparin bridging per pharmacy. Underwent cardioversion during last hospitalization on 11/3 and 11/6 but reverted back to A-fib.  Chronic systolic CHF Remains clinically euvolemic. 2D echo 11/3: LVEF <20%, LV global hypokinesis. Given severe systolic dysfunction, careful with IVF, gentle IVF and close monitoring. Holding Entresto and diuretics for now.  Hepatic cirrhosis Noted  Asthma Stable  History of alcohol and tobacco use disorder Reports that he quit smoking and drinking about 6 months ago.  History of colon cancer History as noted above.   Body mass index is 26.73 kg/m.   DVT prophylaxis:   On IV heparin drip   Code Status: Full Code:  Family Communication: None at bedside Disposition:  Inpatient appropriate since still with bowel obstruction, n.p.o., IV fluids etc. still has NG tube.     Consultants:   [facility]  Procedures:   NG tube to low wall intermittent suction  Subjective:  Patient reports 6-7 BMs in the last 24 hours, including stools, soft and loose, gas etc.  Denies abdominal pain or distention.  Objective:   Vitals:   08/09/24 2009 08/10/24 0512 08/10/24 0600 08/10/24 0751  BP: (!) 156/106 (!) 132/100  (!) 138/110  Pulse: 91 69  83  Resp: 18  18  18  Temp: (!) 96.2 F (35.7 C) (!) 95.9 F (35.5 C) 97.7 F (36.5 C) 98.1 F (36.7 C)  TempSrc: Oral Rectal Oral Oral  SpO2: 98% 97%  98%  Weight:   82.1 kg   Height:        General exam: Young male, moderately  built and nourished lying comfortably propped up in bed without distress.  Oral mucosa moist. Respiratory system: Clear to auscultation. Respiratory effort normal.  Stable. Cardiovascular system: S1 & S2 heard, irregularly irregular. No JVD, murmurs, rubs, gallops or clicks. No pedal edema.  Stable. Gastrointestinal system: Midline laparotomy scar and surgical scar in the RLQ.  Abdomen is nondistended, soft and nontender.  No organomegaly or masses appreciated.  Normal bowel sounds heard.  Still has NG tube in place. Central nervous system: Alert and oriented. No focal neurological deficits. Extremities: Symmetric 5 x 5 power. Skin: No rashes, lesions or ulcers Psychiatry: Judgement and insight appear normal. Mood & affect appropriate.     Data Reviewed:   I have personally reviewed following labs and imaging studies   CBC: Recent Labs  Lab 08/08/24 0447 08/09/24 0223 08/10/24 0359  WBC 8.3 6.7 8.2  HGB 15.3 15.0 14.6  HCT 45.7 44.9 43.1  MCV 85.1 84.2 83.9  PLT 258 252 244    Basic Metabolic Panel: Recent Labs  Lab 08/07/24 0306 08/08/24 0447 08/09/24 0223 08/10/24 0359  NA 135 134* 139 135  K 4.8 4.8 4.3 3.9  CL 105 108 108 102  CO2 17* 17* 20* 21*  GLUCOSE 110* 106* 91 73  BUN 33* 31* 27* 24*  CREATININE 1.80* 1.36* 1.32* 1.34*  CALCIUM 9.3 8.8* 9.2 9.1    Liver Function Tests: Recent Labs  Lab 08/07/24 0306 08/08/24 0447  AST 33 32  ALT 56* 55*  ALKPHOS 83 78  BILITOT 0.6 1.0  PROT 7.5 6.9  ALBUMIN 3.6 3.2*    CBG: No results for input(s): GLUCAP in the last 168 hours.  Microbiology Studies:  No results found for this or any previous visit (from the past 240 hours).  Radiology Studies:  DG Abd 1 View Result Date: 08/10/2024 CLINICAL DATA:  Small bowel obstruction. EXAM: ABDOMEN - 1 VIEW COMPARISON:  08/09/2024 CT scan and plain film exam FINDINGS: Gas dilated bowel loop in the upper abdomen is again noted, similar to previous imaging. NG tube  remains in the stomach. Diffuse gas-filled small bowel. Contrast material in the bladder lumen compatible with CT imaging earlier today. IMPRESSION: Persistent gas dilated bowel loop in the upper abdomen with diffuse gas-filled small bowel. No substantial interval change. Electronically Signed   By: Camellia Candle M.D.   On: 08/10/2024 07:36   CT ABDOMEN PELVIS W CONTRAST Result Date: 08/10/2024 EXAM: CT ABDOMEN AND PELVIS WITH CONTRAST 08/10/2024 12:21:46 AM TECHNIQUE: CT of the abdomen and pelvis was performed with the administration of 75 mL of iohexol  (OMNIPAQUE ) 350 MG/ML injection. Multiplanar reformatted images are provided for review. Automated exposure control, iterative reconstruction, and/or weight-based adjustment of the mA/kV was utilized to reduce the radiation dose to as low as reasonably achievable. COMPARISON: 08/07/2024 CLINICAL HISTORY: Bowel obstruction suspected. FINDINGS: LOWER CHEST: Mild cardiomegaly. Mild bibasilar atelectasis. LIVER: The liver is unremarkable. GALLBLADDER AND BILE DUCTS: Gallbladder is unremarkable. No biliary ductal dilatation. SPLEEN: No acute abnormality. PANCREAS: No acute abnormality. ADRENAL GLANDS: No acute abnormality. KIDNEYS, URETERS AND BLADDER: No stones in the kidneys or ureters. No hydronephrosis. No perinephric or periureteral stranding. Urinary bladder  is unremarkable. GI AND BOWEL: Enteric tube terminates in the distal gastric antrum. Status post left hemicolectomy with lower colonic suture line. Multiple dilated loops of colon in the anterior abdomen with narrowing of bowel in the anterior mid abdomen (image 36), possibly related to an internal hernia. Given rectosigmoid colonic changes, this appearance does not suggest classic sigmoid volvulus. Appendix is favored to be surgically absent. No pneumatosis. PERITONEUM AND RETROPERITONEUM: No ascites. No free air. VASCULATURE: Aorta is normal in caliber. LYMPH NODES: No lymphadenopathy. REPRODUCTIVE  ORGANS: The prostate is unremarkable. BONES AND SOFT TISSUES: No acute osseous abnormality. No focal soft tissue abnormality. IMPRESSION: 1. Multiple dilated loops of colon in the anterior abdomen, recurrent status post endoscopic decompression. Focal narrowing of bowel in the anterior mid abdomen, possibly related to an internal hernia. Given rectosigmoid colonic surgery, this appearance does not suggest classic sigmoid volvulus. 2. No pneumatosis or free air. Electronically signed by: Pinkie Pebbles MD 08/10/2024 12:40 AM EST RP Workstation: [id number]   DG Abd Portable 1V Result Date: 08/09/2024 EXAM: 1 VIEW XRAY OF THE ABDOMEN 08/09/2024 05:50:18 AM COMPARISON: 08/08/2024, 08/07/2024 CLINICAL HISTORY: Bowel obstruction (HCC) FINDINGS: LINES, TUBES AND DEVICES: Enteric tube in place terminating in the region of the decompressed stomach. BOWEL: New, gaseously distended structure in the upper abdomen extending into the right upper quadrant. Nondistended segments of small bowel noted in the mid to lower abdomen. SOFT TISSUES: No opaque urinary calculi. BONES: No acute osseous abnormality. Scattered thoracolumbar osteophytosis. IMPRESSION: 1. Worsening, gaseously distended structure in the upper abdomen extending into the right upper quadrant, which corresponds to a significantly dilated segment of small bowel previously noted on the prior CT just upstream from the sigmoid anastomosis. Given the appearance on the prior CT, where there was twisting of the bowel mesentery in this region, a follow-up CT of the abdomen and pelvis with iv contrast is recommended for further evaluation. These results will be called to the ordering clinician or representative by the radiologist assistant and communication documented in the pacs or clario dashboard. Electronically signed by: Rogelia Myers MD 08/09/2024 08:44 AM EST RP Workstation: [id number]    Scheduled Meds:    amiodarone  200 mg Oral BID   digoxin  0.125 mg  Oral Daily   empagliflozin  10 mg Oral Daily   fluticasone  furoate-vilanterol  1 puff Inhalation Daily   gabapentin   300 mg Oral TID   hydrOXYzine  25 mg Oral QHS   pneumococcal 20-valent conjugate vaccine  0.5 mL Intramuscular Tomorrow-1000   sodium chloride  flush  3 mL Intravenous Q12H    Continuous Infusions:    heparin 1,250 Units/hr (08/10/24 0358)   lactated ringers  50 mL/hr at 08/09/24 2252     LOS: 2 days     Trenda Mar, MD,  FACP, [facility], [facility], [facility]   [facility] Ad[facility] [place]      To contact the attending provider between 7A-7P or the covering provider during after hours 7P-7A, please log into the web site [email or web address] and access using universal [place] password for that web site. If you do not have the password, please call the hospital operator.  08/10/2024, 10:21 AM

## 2024-08-10 NOTE — Progress Notes (Signed)
 Assessment & Plan: Bowel obstruction - Reports surgery in 1990s in [place]  for colon ca with colonic resection and ostomy followed by ostomy reversal. Colonoscopy from 2021 showed 3 possible anastomosis, at rectosigmoid, at 20 cm and again at ileocolonic anastomosis.  - CT 11/12 with Sigmoid colon appears to extend into the right abdomen where there is a relative area of narrowing with associated twisting of the mesentery in the right abdomen just right of midline. The relative area of narrowing then becomes more prominent where there is another suture line over this bowel loop in the anterior mid abdomen as this measures approximately 13.3 cm in diameter.  - Flex sig 11/12 w/ lumen of the rectum, recto- sigmoid colon and sigmoid colon moderately dilated. Decompression performed and post decompression abdomen was soft. - Noted GI plans for Miralax via NGT w/ intermittent NGT clamping today, repeat xray in the AM and possible colonoscopy in near future if able to tolerate prep. Agree with this plan.  - CT 11/14 with multiple dilated loops of colon with focal narrowing in anterior abdomen; no evidence of volvulus   FEN - NPO, NGT to LIWS, IVF per TRH VTE - SCDs, heparin gtt ID - None   - per admitting -  CHF - TEE 11/3 with EF <20%, ECHO 10/28 with EF 25-30% A fib on eliquis - s/p cardioversion 11/6 COPD HTN Hx of alcohol abuse, now sober Hx of tobacco abuse Hx of right sided colon cancer s/p resection in the 1990s Hepatic cirrhosis   Patient up in chair, feeling better.  Having BM's.  Wants to eat.  Per GI.  Will follow with you.          Benjamin Spinner, MD [facility] practice Office: [phone]        Chief Complaint: Colonic obstruction  Subjective: Patient in chair, comfortable, wants to eat.  Denies pain.  Having BM's.  Objective: Vital signs in last 24 hours: Temp:  [95.9 F (35.5 C)-98.1 F (36.7 C)] 98.1 F (36.7 C)  (11/15 0751) Pulse Rate:  [69-91] 83 (11/15 0751) Resp:  [18] 18 (11/15 0751) BP: (132-156)/(100-110) 138/110 (11/15 0751) SpO2:  [97 %-98 %] 98 % (11/15 0751) Weight:  [82.1 kg] 82.1 kg (11/15 0600) Last BM Date : 08/10/24  Intake/Output from previous day: 11/14 0701 - 11/15 0700 In: 60 [NG/GT:60] Out: 800 [Urine:400; Emesis/NG output:400] Intake/Output this shift: Total I/O In: 30 [NG/GT:30] Out: -   Physical Exam: HEENT - sclerae clear, mucous membranes moist Abdomen - soft, protuberant, non-tender; no mass, no guarding  Lab Results:  Recent Labs    08/09/24 0223 08/10/24 0359  WBC 6.7 8.2  HGB 15.0 14.6  HCT 44.9 43.1  PLT 252 244   BMET Recent Labs    08/09/24 0223 08/10/24 0359  NA 139 135  K 4.3 3.9  CL 108 102  CO2 20* 21*  GLUCOSE 91 73  BUN 27* 24*  CREATININE 1.32* 1.34*  CALCIUM 9.2 9.1   PT/INR No results for input(s): LABPROT, INR in the last 72 hours. Comprehensive Metabolic Panel:    Component Value Date/Time   NA 135 08/10/2024 0359   NA 139 08/09/2024 0223   NA 141 02/28/2018 1538   NA 142 06/08/2017 1115   K 3.9 08/10/2024 0359   K 4.3 08/09/2024 0223   CL 102 08/10/2024 0359   CL 108 08/09/2024 0223   CO2 21 (L) 08/10/2024 0359   CO2 20 (L) 08/09/2024  0223   BUN 24 (H) 08/10/2024 0359   BUN 27 (H) 08/09/2024 0223   BUN 7 02/28/2018 1538   BUN 12 06/08/2017 1115   CREATININE 1.34 (H) 08/10/2024 0359   CREATININE 1.32 (H) 08/09/2024 0223   GLUCOSE 73 08/10/2024 0359   GLUCOSE 91 08/09/2024 0223   CALCIUM 9.1 08/10/2024 0359   CALCIUM 9.2 08/09/2024 0223   AST 32 08/08/2024 0447   AST 33 08/07/2024 0306   ALT 55 (H) 08/08/2024 0447   ALT 56 (H) 08/07/2024 0306   ALKPHOS 78 08/08/2024 0447   ALKPHOS 83 08/07/2024 0306   BILITOT 1.0 08/08/2024 0447   BILITOT 0.6 08/07/2024 0306   BILITOT 0.4 06/08/2017 1115   PROT 6.9 08/08/2024 0447   PROT 7.5 08/07/2024 0306   PROT 7.3 06/08/2017 1115   ALBUMIN 3.2 (L) 08/08/2024  0447   ALBUMIN 3.6 08/07/2024 0306   ALBUMIN 4.6 06/08/2017 1115    Studies/Results: DG Abd 1 View Result Date: 08/10/2024 CLINICAL DATA:  Small bowel obstruction. EXAM: ABDOMEN - 1 VIEW COMPARISON:  08/09/2024 CT scan and plain film exam FINDINGS: Gas dilated bowel loop in the upper abdomen is again noted, similar to previous imaging. NG tube remains in the stomach. Diffuse gas-filled small bowel. Contrast material in the bladder lumen compatible with CT imaging earlier today. IMPRESSION: Persistent gas dilated bowel loop in the upper abdomen with diffuse gas-filled small bowel. No substantial interval change. Electronically Signed   By: Camellia Candle M.D.   On: 08/10/2024 07:36   CT ABDOMEN PELVIS W CONTRAST Result Date: 08/10/2024 EXAM: CT ABDOMEN AND PELVIS WITH CONTRAST 08/10/2024 12:21:46 AM TECHNIQUE: CT of the abdomen and pelvis was performed with the administration of 75 mL of iohexol  (OMNIPAQUE ) 350 MG/ML injection. Multiplanar reformatted images are provided for review. Automated exposure control, iterative reconstruction, and/or weight-based adjustment of the mA/kV was utilized to reduce the radiation dose to as low as reasonably achievable. COMPARISON: 08/07/2024 CLINICAL HISTORY: Bowel obstruction suspected. FINDINGS: LOWER CHEST: Mild cardiomegaly. Mild bibasilar atelectasis. LIVER: The liver is unremarkable. GALLBLADDER AND BILE DUCTS: Gallbladder is unremarkable. No biliary ductal dilatation. SPLEEN: No acute abnormality. PANCREAS: No acute abnormality. ADRENAL GLANDS: No acute abnormality. KIDNEYS, URETERS AND BLADDER: No stones in the kidneys or ureters. No hydronephrosis. No perinephric or periureteral stranding. Urinary bladder is unremarkable. GI AND BOWEL: Enteric tube terminates in the distal gastric antrum. Status post left hemicolectomy with lower colonic suture line. Multiple dilated loops of colon in the anterior abdomen with narrowing of bowel in the anterior mid abdomen  (image 36), possibly related to an internal hernia. Given rectosigmoid colonic changes, this appearance does not suggest classic sigmoid volvulus. Appendix is favored to be surgically absent. No pneumatosis. PERITONEUM AND RETROPERITONEUM: No ascites. No free air. VASCULATURE: Aorta is normal in caliber. LYMPH NODES: No lymphadenopathy. REPRODUCTIVE ORGANS: The prostate is unremarkable. BONES AND SOFT TISSUES: No acute osseous abnormality. No focal soft tissue abnormality. IMPRESSION: 1. Multiple dilated loops of colon in the anterior abdomen, recurrent status post endoscopic decompression. Focal narrowing of bowel in the anterior mid abdomen, possibly related to an internal hernia. Given rectosigmoid colonic surgery, this appearance does not suggest classic sigmoid volvulus. 2. No pneumatosis or free air. Electronically signed by: Pinkie Pebbles MD 08/10/2024 12:40 AM EST RP Workstation: [id number]   DG Abd Portable 1V Result Date: 08/09/2024 EXAM: 1 VIEW XRAY OF THE ABDOMEN 08/09/2024 05:50:18 AM COMPARISON: 08/08/2024, 08/07/2024 CLINICAL HISTORY: Bowel obstruction (HCC) FINDINGS: LINES, TUBES AND DEVICES: Enteric tube  in place terminating in the region of the decompressed stomach. BOWEL: New, gaseously distended structure in the upper abdomen extending into the right upper quadrant. Nondistended segments of small bowel noted in the mid to lower abdomen. SOFT TISSUES: No opaque urinary calculi. BONES: No acute osseous abnormality. Scattered thoracolumbar osteophytosis. IMPRESSION: 1. Worsening, gaseously distended structure in the upper abdomen extending into the right upper quadrant, which corresponds to a significantly dilated segment of small bowel previously noted on the prior CT just upstream from the sigmoid anastomosis. Given the appearance on the prior CT, where there was twisting of the bowel mesentery in this region, a follow-up CT of the abdomen and pelvis with iv contrast is recommended for  further evaluation. These results will be called to the ordering clinician or representative by the radiologist assistant and communication documented in the pacs or clario dashboard. Electronically signed by: Rogelia Myers MD 08/09/2024 08:44 AM EST RP Workstation: [email or web address] Benjamin Abbott 08/10/2024  Patient ID: Benjamin Abbott, male   DOB: 04/17/1961, 64 y.o.   MRN: [id number]

## 2024-08-10 NOTE — Progress Notes (Signed)
 Care order instruction: Clamp NGT every 6 hours for 2 hours if tolerated and assess tolerance and document in progress notes:  Clamped: 4:30pm Unclamped: 6:30pm Tolerated: Well, pt denies nausea at this time.

## 2024-08-10 NOTE — Progress Notes (Signed)
 [facility] Progress Note  Benjamin Abbott 63 y.o. [birth year]   Subjective: Denies abdominal pain. Seen this morning and lying in bed. BMs yesterday. Miralax yesterday through NG tube.  Objective: Vital signs: Vitals:   08/10/24 0600 08/10/24 0751  BP:  (!) 138/110  Pulse:  83  Resp:  18  Temp: 97.7 F (36.5 C) 98.1 F (36.7 C)  SpO2:  98%    Physical Exam: Gen: lethargic, elderly, thin, no acute distress  HEENT: anicteric sclera CV: RRR Chest: CTA B Abd: soft, nontender, nondistended, +BS Ext: no edema  Lab Results: Recent Labs    08/09/24 0223 08/10/24 0359  NA 139 135  K 4.3 3.9  CL 108 102  CO2 20* 21*  GLUCOSE 91 73  BUN 27* 24*  CREATININE 1.32* 1.34*  CALCIUM 9.2 9.1   Recent Labs    08/08/24 0447  AST 32  ALT 55*  ALKPHOS 78  BILITOT 1.0  PROT 6.9  ALBUMIN 3.2*   Recent Labs    08/09/24 0223 08/10/24 0359  WBC 6.7 8.2  HGB 15.0 14.6  HCT 44.9 43.1  MCV 84.2 83.9  PLT 252 244      Assessment/Plan: Large Bowel obstruction - no volvulus on CT. Moving bowels following Miralax via NGT but no change in dilated loops on Xray. Clamp NGT and see if tolerates. Recheck Xray tomorrow. NPO. Supportive care.   Benjamin Abbott 08/10/2024, 2:43 PM  Questions please call [phone] ID: Benjamin Abbott, male   DOB: 1961/01/04, 63 y.o.   MRN: [id number]

## 2024-08-10 NOTE — Progress Notes (Signed)
 PHARMACY - ANTICOAGULATION CONSULT NOTE  Pharmacy Consult for heparin  Indication: atrial fibrillation  Allergies  Allergen Reactions   Gadolinium Derivatives Shortness Of Breath    Bronchospasms-Pt asthmatic and had albuterol  inhaler, which he used and it broke the spasms. 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,RN    Patient Measurements: Height: 5' 9 (175.3 cm) Weight: 82.1 kg (181 lb) IBW/kg (Calculated) : 70.7 HEPARIN DW (KG): 85.1  Vital Signs: Temp: 98.1 F (36.7 C) (11/15 0751) Temp Source: Oral (11/15 0751) BP: 138/110 (11/15 0751) Pulse Rate: 83 (11/15 0751)  Labs: Recent Labs    08/07/24 1956 08/07/24 1956 08/08/24 0447 08/09/24 0223 08/10/24 0359  HGB  --    < > 15.3 15.0 14.6  HCT  --   --  45.7 44.9 43.1  PLT  --   --  258 252 244  APTT 44*  --  67*  --   --   HEPARINUNFRC 0.39  --  0.69 0.55 0.41  CREATININE  --   --  1.36* 1.32* 1.34*   < > = values in this interval not displayed.    Estimated Creatinine Clearance: 57.9 mL/min (A) (by C-G formula based on SCr of 1.34 mg/dL (H)).  Assessment: Patient presented with CC of abdominal pain and emesis. Found to have potential SBO and possible sigmoid volvulus. Potential for sigmoid colectomy. Known hx of Afib on Eliquis PTA, last dose 11/11 @ noon. Pharmacy consulted to dose heparin gtt in peri-op time period.   Heparin level therapeutic at 0.41 on 1250 units/hr. No bleeding noted, CBC stable.  Goal of Therapy:  Heparin level 0.3-0.7 units/ml Monitor platelets by anticoagulation protocol: Yes   Plan:  Continue heparin infusion at 1250 units/hr   F/u daily heparin level, cbc and s/sx of bleeding F/u plans to transition back to Eliquis  Thank you for involving pharmacy in this patient's care.  Levorn Gaskins, RPh Clinical Pharmacist Clinical phone for 08/10/2024 is [phone] 08/10/2024 11:50 AM

## 2024-08-11 ENCOUNTER — Inpatient Hospital Stay (HOSPITAL_COMMUNITY)

## 2024-08-11 DIAGNOSIS — K56600 Partial intestinal obstruction, unspecified as to cause: Secondary | ICD-10-CM | POA: Diagnosis not present

## 2024-08-11 LAB — CBC
HCT: 43.2 % (ref 39.0–52.0)
Hemoglobin: 14.6 g/dL (ref 13.0–17.0)
MCH: 28.1 pg (ref 26.0–34.0)
MCHC: 33.8 g/dL (ref 30.0–36.0)
MCV: 83.2 fL (ref 80.0–100.0)
Platelets: 240 K/uL (ref 150–400)
RBC: 5.19 MIL/uL (ref 4.22–5.81)
RDW: 14.4 % (ref 11.5–15.5)
WBC: 10.3 K/uL (ref 4.0–10.5)
nRBC: 0 % (ref 0.0–0.2)

## 2024-08-11 LAB — BASIC METABOLIC PANEL WITH GFR
Anion gap: 13 (ref 5–15)
BUN: 25 mg/dL — ABNORMAL HIGH (ref 8–23)
CO2: 24 mmol/L (ref 22–32)
Calcium: 9.2 mg/dL (ref 8.9–10.3)
Chloride: 102 mmol/L (ref 98–111)
Creatinine, Ser: 1.58 mg/dL — ABNORMAL HIGH (ref 0.61–1.24)
GFR, Estimated: 49 mL/min — ABNORMAL LOW (ref >60)
Glucose, Bld: 79 mg/dL (ref 70–99)
Potassium: 4 mmol/L (ref 3.5–5.1)
Sodium: 139 mmol/L (ref 135–145)

## 2024-08-11 LAB — HEPARIN LEVEL (UNFRACTIONATED): Heparin Unfractionated: 0.35 [IU]/mL (ref 0.30–0.70)

## 2024-08-11 MED ORDER — LACTATED RINGERS IV SOLN: INTRAVENOUS | Status: AC

## 2024-08-11 NOTE — Progress Notes (Signed)
 PHARMACY - ANTICOAGULATION CONSULT NOTE  Pharmacy Consult for heparin  Indication: atrial fibrillation  Allergies  Allergen Reactions   Gadolinium Derivatives Shortness Of Breath    Bronchospasms-Pt asthmatic and had albuterol  inhaler, which he used and it broke the spasms. 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,RN    Patient Measurements: Height: 5' 9 (175.3 cm) Weight: 80.4 kg (177 lb 4 oz) IBW/kg (Calculated) : 70.7 HEPARIN DW (KG): 85.1  Vital Signs: Temp: 98.7 F (37.1 C) (11/16 0900) Temp Source: Oral (11/16 0900) BP: 134/94 (11/16 0900) Pulse Rate: 72 (11/16 0900)  Labs: Recent Labs    08/09/24 0223 08/10/24 0359 08/11/24 0653  HGB 15.0 14.6 14.6  HCT 44.9 43.1 43.2  PLT 252 244 240  HEPARINUNFRC 0.55 0.41 0.35  CREATININE 1.32* 1.34* 1.58*    Estimated Creatinine Clearance: 49.1 mL/min (A) (by C-G formula based on SCr of 1.58 mg/dL (H)).  Assessment: Patient presented with CC of abdominal pain and emesis. Found to have potential SBO and possible sigmoid volvulus. Potential for sigmoid colectomy. Known hx of Afib on Eliquis PTA, last dose 11/11 @ noon. Pharmacy consulted to dose heparin gtt in peri-op time period.   Heparin level therapeutic at 0.35 on 1250 units/hr. No bleeding noted, CBC  within normal/ stable.  Goal of Therapy:  Heparin level 0.3-0.7 units/ml Monitor platelets by anticoagulation protocol: Yes   Plan:  Continue heparin infusion at 1250 units/hr   F/u daily heparin level, cbc and s/sx of bleeding F/u plans to transition back to Eliquis  Thank you for involving pharmacy in this patient's care.  Levorn Gaskins, RPh Clinical Pharmacist Clinical phone for 08/11/2024 is [phone] 08/11/2024 10:58 AM

## 2024-08-11 NOTE — Progress Notes (Signed)
 PROGRESS NOTE   Luc Shammas  FMW:[id number]    DOB: [birth year]    DOA: 08/07/2024  PCP: Leontine Cramp, NP   I have briefly reviewed patients previous medical records in [facility].   Brief Hospital Course:  63 year old male with medical history significant for colon cancer, s/p multiple surgeries, right colectomy, ostomy followed by ostomy reversal, prior colonoscopy showing 3 possible anastomosis, HTN, PAF on DOAC, COPD, alcohol and tobacco use disorder, presented to the ED with complaints of abdominal pain, nausea and vomiting.  CT abdomen concerning for sigmoid volvulus.  Underwent emergent sigmoidoscopy without typical changes of volvulus, probably prepped bowel.  Follow-up abdominal x-ray showing small bowel distention.  Plan on bowel prep including enemas, MiraLAX via NG tube, serial abdominal x-rays and consideration for colonoscopy.  [facility] following.  Bowel obstruction is clinically better but radiologically persistent or worse.  Ongoing NG tube clamping trials, ice chips and if tolerates, plans to DC NG tube and advance diet tomorrow.   Assessment & Plan:   Large bowel obstruction. History as noted above.  History of multiple abdominal surgeries. [facility] consultation and follow-up appreciated. Emergent sigmoidoscopy 11/12 did not show typical features for sigmoid volvulus but the bowel prep was poor. Treated supportively with bowel rest n.p.o., NG tube to low intermittent wall suction, IVF, enemas, MiraLAX. Despite abdominal x-ray and CT A/P showing significant bowel distention, patient continues to have daily multiple BMs.  No abdominal pain or distention.  He tolerated NG tube clamping for 2 hours at a time.  GI increasing clamping of NG tube to 12 hours.  If he tolerates longer duration of clamping of NG tube, they are considering discontinuing NG tube tomorrow and advancing diet.  No mention of colonoscopy by GI (seen in [facility] note).  Acute kidney injury  complicating stage IIIa CKD NAGMA Baseline creatinine probably in the 1.3-1.4 range.  Presented with creatinine of 1.8.  Suspected due to poor oral intake, nausea and vomiting AKI resolved after IV fluids.-Slightly up from 1.34-1.58.  Continue gentle IVF and follow BMP in AM. Avoid hypotension and nephrotoxic meds.  Continue to trend daily BMP.  Paroxysmal atrial fibrillation Rate controlled on p.o. amiodarone and digoxin, continue n.p.o. except meds and NG tube clamping for an hour post meds Eliquis held in case warrants procedures.  Continue IV heparin bridging per pharmacy. Underwent cardioversion during last hospitalization on 11/3 and 11/6 but reverted back to A-fib.  Chronic systolic CHF Remains clinically euvolemic. 2D echo 11/3: LVEF <20%, LV global hypokinesis. Given severe systolic dysfunction, careful with IVF, gentle IVF and close monitoring. Holding Entresto and diuretics for now.  Hepatic cirrhosis Noted  Asthma Stable  History of alcohol and tobacco use disorder Reports that he quit smoking and drinking about 6 months ago.  History of colon cancer History as noted above.   Body mass index is 26.18 kg/m.   DVT prophylaxis:   On IV heparin drip   Code Status: Full Code:  Family Communication: None at bedside Disposition:  Inpatient appropriate since still with bowel obstruction, n.p.o., IV fluids etc. still has NG tube.     Consultants:   [facility]  Procedures:   NG tube clamping trials.  Still no diet.  Subjective:  Had multiple BMs overnight and 1 or 2 this morning.  No abdominal complaints.  Reported some throat pain, better after lozenges.  Objective:   Vitals:   08/11/24 0330 08/11/24 0500 08/11/24 0828 08/11/24 0900  BP: (!) 132/98   (!) 134/94  Pulse: 93  76 72  Resp: 17  17 16   Temp: 98.4 F (36.9 C)   98.7 F (37.1 C)  TempSrc:    Oral  SpO2: 97%  97% 98%  Weight:  80.4 kg    Height:        General exam: Young  male, moderately built and nourished sitting up comfortably in chair. Respiratory system: Clear to auscultation. Respiratory effort normal.  Stable. Cardiovascular system: S1 & S2 heard, irregularly irregular. No JVD, murmurs, rubs, gallops or clicks. No pedal edema.  Telemetry personally reviewed: A-fib with controlled ventricular rate.  6 beats of NSVT versus PVCs on 11/15 night. Gastrointestinal system: Midline laparotomy scar and surgical scar in the RLQ.  Abdomen is nondistended, soft and nontender.  No organomegaly or masses appreciated.  Normal bowel sounds heard.  Still has NG tube in place. Central nervous system: Alert and oriented. No focal neurological deficits. Extremities: Symmetric 5 x 5 power. Skin: No rashes, lesions or ulcers Psychiatry: Judgement and insight appear normal. Mood & affect appropriate.  ENT: Throat exam with a tongue depressor and flashlight unremarkable.  No acute findings noted.  May be throat discomfort is related to the NG tube.  Abdominal x-ray shows NG tube is appropriately placed in the distal stomach.    Data Reviewed:   I have personally reviewed following labs and imaging studies   CBC: Recent Labs  Lab 08/09/24 0223 08/10/24 0359 08/11/24 0653  WBC 6.7 8.2 10.3  HGB 15.0 14.6 14.6  HCT 44.9 43.1 43.2  MCV 84.2 83.9 83.2  PLT 252 244 240    Basic Metabolic Panel: Recent Labs  Lab 08/07/24 0306 08/08/24 0447 08/09/24 0223 08/10/24 0359 08/11/24 0653  NA 135 134* 139 135 139  K 4.8 4.8 4.3 3.9 4.0  CL 105 108 108 102 102  CO2 17* 17* 20* 21* 24  GLUCOSE 110* 106* 91 73 79  BUN 33* 31* 27* 24* 25*  CREATININE 1.80* 1.36* 1.32* 1.34* 1.58*  CALCIUM 9.3 8.8* 9.2 9.1 9.2    Liver Function Tests: Recent Labs  Lab 08/07/24 0306 08/08/24 0447  AST 33 32  ALT 56* 55*  ALKPHOS 83 78  BILITOT 0.6 1.0  PROT 7.5 6.9  ALBUMIN 3.6 3.2*    CBG: No results for input(s): GLUCAP in the last 168 hours.  Microbiology Studies:  No  results found for this or any previous visit (from the past 240 hours).  Radiology Studies:  DG Abd 1 View Result Date: 08/11/2024 CLINICAL DATA:  Abdominal pain.  Abdominal distension. EXAM: ABDOMEN - 1 VIEW COMPARISON:  08/10/2024 FINDINGS: NG tube tip is in the distal stomach. Gaseous distention of bowel in the upper abdomen is similar to prior. Visualized bony anatomy unremarkable. IMPRESSION: NG tube tip is in the distal stomach. No substantial interval change in bowel gas pattern. Electronically Signed   By: Camellia Candle M.D.   On: 08/11/2024 07:50   DG Abd 1 View Result Date: 08/10/2024 CLINICAL DATA:  Small bowel obstruction. EXAM: ABDOMEN - 1 VIEW COMPARISON:  08/09/2024 CT scan and plain film exam FINDINGS: Gas dilated bowel loop in the upper abdomen is again noted, similar to previous imaging. NG tube remains in the stomach. Diffuse gas-filled small bowel. Contrast material in the bladder lumen compatible with CT imaging earlier today. IMPRESSION: Persistent gas dilated bowel loop in the upper abdomen with diffuse gas-filled small bowel. No substantial interval change. Electronically Signed  By: Camellia Candle M.D.   On: 08/10/2024 07:36   CT ABDOMEN PELVIS W CONTRAST Result Date: 08/10/2024 EXAM: CT ABDOMEN AND PELVIS WITH CONTRAST 08/10/2024 12:21:46 AM TECHNIQUE: CT of the abdomen and pelvis was performed with the administration of 75 mL of iohexol  (OMNIPAQUE ) 350 MG/ML injection. Multiplanar reformatted images are provided for review. Automated exposure control, iterative reconstruction, and/or weight-based adjustment of the mA/kV was utilized to reduce the radiation dose to as low as reasonably achievable. COMPARISON: 08/07/2024 CLINICAL HISTORY: Bowel obstruction suspected. FINDINGS: LOWER CHEST: Mild cardiomegaly. Mild bibasilar atelectasis. LIVER: The liver is unremarkable. GALLBLADDER AND BILE DUCTS: Gallbladder is unremarkable. No biliary ductal dilatation. SPLEEN: No acute  abnormality. PANCREAS: No acute abnormality. ADRENAL GLANDS: No acute abnormality. KIDNEYS, URETERS AND BLADDER: No stones in the kidneys or ureters. No hydronephrosis. No perinephric or periureteral stranding. Urinary bladder is unremarkable. GI AND BOWEL: Enteric tube terminates in the distal gastric antrum. Status post left hemicolectomy with lower colonic suture line. Multiple dilated loops of colon in the anterior abdomen with narrowing of bowel in the anterior mid abdomen (image 36), possibly related to an internal hernia. Given rectosigmoid colonic changes, this appearance does not suggest classic sigmoid volvulus. Appendix is favored to be surgically absent. No pneumatosis. PERITONEUM AND RETROPERITONEUM: No ascites. No free air. VASCULATURE: Aorta is normal in caliber. LYMPH NODES: No lymphadenopathy. REPRODUCTIVE ORGANS: The prostate is unremarkable. BONES AND SOFT TISSUES: No acute osseous abnormality. No focal soft tissue abnormality. IMPRESSION: 1. Multiple dilated loops of colon in the anterior abdomen, recurrent status post endoscopic decompression. Focal narrowing of bowel in the anterior mid abdomen, possibly related to an internal hernia. Given rectosigmoid colonic surgery, this appearance does not suggest classic sigmoid volvulus. 2. No pneumatosis or free air. Electronically signed by: Pinkie Pebbles MD 08/10/2024 12:40 AM EST RP Workstation: [id number]    Scheduled Meds:    amiodarone  200 mg Oral BID   digoxin  0.125 mg Oral Daily   empagliflozin  10 mg Oral Daily   fluticasone  furoate-vilanterol  1 puff Inhalation Daily   gabapentin   300 mg Oral TID   hydrOXYzine  25 mg Oral QHS   pneumococcal 20-valent conjugate vaccine  0.5 mL Intramuscular Tomorrow-1000   sodium chloride  flush  3 mL Intravenous Q12H    Continuous Infusions:    heparin 1,250 Units/hr (08/11/24 0144)     LOS: 3 days     Trenda Mar, MD,  FACP, [facility], [facility], [facility]   [facility]  [facility]ician Advisor [place]      To contact the attending provider between 7A-7P or the covering provider during after hours 7P-7A, please log into the web site [email or web address] and access using universal [place] password for that web site. If you do not have the password, please call the hospital operator.  08/11/2024, 2:45 PM

## 2024-08-11 NOTE — Progress Notes (Signed)
 [facility] Gastroenterology Progress Note  Calton Harshfield 63 y.o. 06/17/1961   Subjective: Sitting up in bed with NGT suction in place. Tolerating ice chips. Denies abdominal pain.  Objective: Vital signs: Vitals:   08/11/24 0828 08/11/24 0900  BP:  (!) 134/94  Pulse: 76 72  Resp: 17 16  Temp:  98.7 F (37.1 C)  SpO2: 97% 98%    Physical Exam: Gen: lethargic, elderly, think, no acute distress  HEENT: anicteric sclera, NGT CV: RRR Chest: CTA B Abd: soft, nontender, nondistended, +BS Ext: no edema  Lab Results: Recent Labs    08/10/24 0359 08/11/24 0653  NA 135 139  K 3.9 4.0  CL 102 102  CO2 21* 24  GLUCOSE 73 79  BUN 24* 25*  CREATININE 1.34* 1.58*  CALCIUM 9.1 9.2   No results for input(s): AST, ALT, ALKPHOS, BILITOT, PROT, ALBUMIN in the last 72 hours. Recent Labs    08/10/24 0359 08/11/24 0653  WBC 8.2 10.3  HGB 14.6 14.6  HCT 43.1 43.2  MCV 83.9 83.2  PLT 244 240      Assessment/Plan: Large bowel obstruction - clinically improving despite no change in distention on Xray. Tolerated clamping of NGT for 2 hours at a time. Will increase clamping of NGT to 12 hours. Continue ice chips. If tolerates longer duration of clamping of NGT consider d/c NGT tomorrow and advancing diet. Dr. Elicia will reevaluate tomorrow.   Jerrell JAYSON Sol 08/11/2024, 12:42 PM  Questions please call [phone] ID: Dorell Gatlin, male   DOB: 04/28/1961, 63 y.o.   MRN: [id number]

## 2024-08-11 NOTE — Progress Notes (Signed)
 Assessment & Plan: Bowel obstruction - Reports surgery in 1990s in [facility] [place]  for colon ca with colonic resection and ostomy followed by ostomy reversal. Colonoscopy from 2021 showed 3 possible anastomosis, at rectosigmoid, at 20 cm and again at ileocolonic anastomosis.  - CT 11/12 with Sigmoid colon appears to extend into the right abdomen where there is a relative area of narrowing with associated twisting of the mesentery in the right abdomen just right of midline. The relative area of narrowing then becomes more prominent where there is another suture line over this bowel loop in the anterior mid abdomen as this measures approximately 13.3 cm in diameter.  - Flex sig 11/12 w/ lumen of the rectum, recto- sigmoid colon and sigmoid colon moderately dilated. Decompression performed and post decompression abdomen was soft.   FEN - NPO (ice chips), NGT to LIWS, IVF per TRH VTE - SCDs, heparin gtt ID - None   - per admitting -  CHF - TEE 11/3 with EF <20%, ECHO 10/28 with EF 25-30% A fib on eliquis - s/p cardioversion 11/6 COPD HTN Hx of alcohol abuse, now sober Hx of tobacco abuse Hx of right sided colon cancer s/p resection in the 1990s Hepatic cirrhosis    Patient up in chair, feeling better.  Having BM's.  Wants to eat - will allow ice chips.  Complains of throat pain - will get CXR to confirm NG location.   Continue gentle bowel prep per GI.  Anticipate colonoscopy.  Will follow with you.        Krystal Spinner, MD [facility] practice Office: [phone]        Chief Complaint: Bowel obstruction  Subjective: Patient up in chair.  Complains of throat pain - something is stuck.  Having BM's.  Denies abdominal pain.  Objective: Vital signs in last 24 hours: Temp:  [97.8 F (36.6 C)-98.8 F (37.1 C)] 98.7 F (37.1 C) (11/16 0900) Pulse Rate:  [64-93] 72 (11/16 0900) Resp:  [16-18] 16 (11/16 0900) BP: (132-144)/(94-114) 134/94  (11/16 0900) SpO2:  [97 %-99 %] 98 % (11/16 0900) Weight:  [80.4 kg] 80.4 kg (11/16 0500) Last BM Date : 08/11/24  Intake/Output from previous day: 11/15 0701 - 11/16 0700 In: 120 [NG/GT:120] Out: 1950 [Urine:600; Emesis/NG output:1350] Intake/Output this shift: No intake/output data recorded.  Physical Exam: HEENT - sclerae clear, mucous membranes moist Neck - soft, trachea midline Abdomen - soft, non-tender  Lab Results:  Recent Labs    08/10/24 0359 08/11/24 0653  WBC 8.2 10.3  HGB 14.6 14.6  HCT 43.1 43.2  PLT 244 240   BMET Recent Labs    08/10/24 0359 08/11/24 0653  NA 135 139  K 3.9 4.0  CL 102 102  CO2 21* 24  GLUCOSE 73 79  BUN 24* 25*  CREATININE 1.34* 1.58*  CALCIUM 9.1 9.2   PT/INR No results for input(s): LABPROT, INR in the last 72 hours. Comprehensive Metabolic Panel:    Component Value Date/Time   NA 139 08/11/2024 0653   NA 135 08/10/2024 0359   NA 141 02/28/2018 1538   NA 142 06/08/2017 1115   K 4.0 08/11/2024 0653   K 3.9 08/10/2024 0359   CL 102 08/11/2024 0653   CL 102 08/10/2024 0359   CO2 24 08/11/2024 0653   CO2 21 (L) 08/10/2024 0359   BUN 25 (H) 08/11/2024 0653   BUN 24 (H) 08/10/2024 0359   BUN 7 02/28/2018 1538   BUN  12 06/08/2017 1115   CREATININE 1.58 (H) 08/11/2024 0653   CREATININE 1.34 (H) 08/10/2024 0359   GLUCOSE 79 08/11/2024 0653   GLUCOSE 73 08/10/2024 0359   CALCIUM 9.2 08/11/2024 0653   CALCIUM 9.1 08/10/2024 0359   AST 32 08/08/2024 0447   AST 33 08/07/2024 0306   ALT 55 (H) 08/08/2024 0447   ALT 56 (H) 08/07/2024 0306   ALKPHOS 78 08/08/2024 0447   ALKPHOS 83 08/07/2024 0306   BILITOT 1.0 08/08/2024 0447   BILITOT 0.6 08/07/2024 0306   BILITOT 0.4 06/08/2017 1115   PROT 6.9 08/08/2024 0447   PROT 7.5 08/07/2024 0306   PROT 7.3 06/08/2017 1115   ALBUMIN 3.2 (L) 08/08/2024 0447   ALBUMIN 3.6 08/07/2024 0306   ALBUMIN 4.6 06/08/2017 1115    Studies/Results: DG Abd 1 View Result Date:  08/11/2024 CLINICAL DATA:  Abdominal pain.  Abdominal distension. EXAM: ABDOMEN - 1 VIEW COMPARISON:  08/10/2024 FINDINGS: NG tube tip is in the distal stomach. Gaseous distention of bowel in the upper abdomen is similar to prior. Visualized bony anatomy unremarkable. IMPRESSION: NG tube tip is in the distal stomach. No substantial interval change in bowel gas pattern. Electronically Signed   By: Camellia Candle M.D.   On: 08/11/2024 07:50   DG Abd 1 View Result Date: 08/10/2024 CLINICAL DATA:  Small bowel obstruction. EXAM: ABDOMEN - 1 VIEW COMPARISON:  08/09/2024 CT scan and plain film exam FINDINGS: Gas dilated bowel loop in the upper abdomen is again noted, similar to previous imaging. NG tube remains in the stomach. Diffuse gas-filled small bowel. Contrast material in the bladder lumen compatible with CT imaging earlier today. IMPRESSION: Persistent gas dilated bowel loop in the upper abdomen with diffuse gas-filled small bowel. No substantial interval change. Electronically Signed   By: Camellia Candle M.D.   On: 08/10/2024 07:36   CT ABDOMEN PELVIS W CONTRAST Result Date: 08/10/2024 EXAM: CT ABDOMEN AND PELVIS WITH CONTRAST 08/10/2024 12:21:46 AM TECHNIQUE: CT of the abdomen and pelvis was performed with the administration of 75 mL of iohexol  (OMNIPAQUE ) 350 MG/ML injection. Multiplanar reformatted images are provided for review. Automated exposure control, iterative reconstruction, and/or weight-based adjustment of the mA/kV was utilized to reduce the radiation dose to as low as reasonably achievable. COMPARISON: 08/07/2024 CLINICAL HISTORY: Bowel obstruction suspected. FINDINGS: LOWER CHEST: Mild cardiomegaly. Mild bibasilar atelectasis. LIVER: The liver is unremarkable. GALLBLADDER AND BILE DUCTS: Gallbladder is unremarkable. No biliary ductal dilatation. SPLEEN: No acute abnormality. PANCREAS: No acute abnormality. ADRENAL GLANDS: No acute abnormality. KIDNEYS, URETERS AND BLADDER: No stones in the  kidneys or ureters. No hydronephrosis. No perinephric or periureteral stranding. Urinary bladder is unremarkable. GI AND BOWEL: Enteric tube terminates in the distal gastric antrum. Status post left hemicolectomy with lower colonic suture line. Multiple dilated loops of colon in the anterior abdomen with narrowing of bowel in the anterior mid abdomen (image 36), possibly related to an internal hernia. Given rectosigmoid colonic changes, this appearance does not suggest classic sigmoid volvulus. Appendix is favored to be surgically absent. No pneumatosis. PERITONEUM AND RETROPERITONEUM: No ascites. No free air. VASCULATURE: Aorta is normal in caliber. LYMPH NODES: No lymphadenopathy. REPRODUCTIVE ORGANS: The prostate is unremarkable. BONES AND SOFT TISSUES: No acute osseous abnormality. No focal soft tissue abnormality. IMPRESSION: 1. Multiple dilated loops of colon in the anterior abdomen, recurrent status post endoscopic decompression. Focal narrowing of bowel in the anterior mid abdomen, possibly related to an internal hernia. Given rectosigmoid colonic surgery, this appearance does not  suggest classic sigmoid volvulus. 2. No pneumatosis or free air. Electronically signed by: Pinkie Pebbles MD 08/10/2024 12:40 AM EST RP Workstation: [id number]      Krystal Spinner 08/11/2024  Patient ID: Benjamin Abbott, male   DOB: 07-10-1961, 63 y.o.   MRN: [id number]

## 2024-08-11 NOTE — Plan of Care (Signed)

## 2024-08-11 NOTE — Plan of Care (Signed)
  Problem: Activity: Goal: Risk for activity intolerance will decrease Outcome: Progressing   Problem: Nutrition: Goal: Adequate nutrition will be maintained Outcome: Progressing   Problem: Elimination: Goal: Will not experience complications related to bowel motility Outcome: Progressing Goal: Will not experience complications related to urinary retention Outcome: Progressing   Problem: Pain Managment: Goal: General experience of comfort will improve and/or be controlled Outcome: Progressing

## 2024-08-12 DIAGNOSIS — K56600 Partial intestinal obstruction, unspecified as to cause: Secondary | ICD-10-CM | POA: Diagnosis not present

## 2024-08-12 LAB — BASIC METABOLIC PANEL WITH GFR
Anion gap: 12 (ref 5–15)
BUN: 29 mg/dL — ABNORMAL HIGH (ref 8–23)
CO2: 24 mmol/L (ref 22–32)
Calcium: 9.3 mg/dL (ref 8.9–10.3)
Chloride: 98 mmol/L (ref 98–111)
Creatinine, Ser: 1.57 mg/dL — ABNORMAL HIGH (ref 0.61–1.24)
GFR, Estimated: 50 mL/min — ABNORMAL LOW (ref >60)
Glucose, Bld: 74 mg/dL (ref 70–99)
Potassium: 3.6 mmol/L (ref 3.5–5.1)
Sodium: 134 mmol/L — ABNORMAL LOW (ref 135–145)

## 2024-08-12 LAB — CBC
HCT: 43.3 % (ref 39.0–52.0)
Hemoglobin: 14.9 g/dL (ref 13.0–17.0)
MCH: 28.5 pg (ref 26.0–34.0)
MCHC: 34.4 g/dL (ref 30.0–36.0)
MCV: 83 fL (ref 80.0–100.0)
Platelets: 248 K/uL (ref 150–400)
RBC: 5.22 MIL/uL (ref 4.22–5.81)
RDW: 14.2 % (ref 11.5–15.5)
WBC: 9.8 K/uL (ref 4.0–10.5)
nRBC: 0 % (ref 0.0–0.2)

## 2024-08-12 LAB — HEPARIN LEVEL (UNFRACTIONATED): Heparin Unfractionated: 0.36 [IU]/mL (ref 0.30–0.70)

## 2024-08-12 NOTE — Plan of Care (Signed)

## 2024-08-12 NOTE — Progress Notes (Signed)
 PHARMACY - ANTICOAGULATION CONSULT NOTE  Pharmacy Consult for heparin  Indication: atrial fibrillation  Allergies  Allergen Reactions   Gadolinium Derivatives Shortness Of Breath    Bronchospasms-Pt asthmatic and had albuterol  inhaler, which he used and it broke the spasms. 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,RN    Patient Measurements: Height: 5' 9 (175.3 cm) Weight: 79.5 kg (175 lb 4.3 oz) IBW/kg (Calculated) : 70.7 HEPARIN DW (KG): 85.1  Vital Signs: Temp: 98.1 F (36.7 C) (11/17 0757) Temp Source: Oral (11/17 0420) BP: 126/96 (11/17 0757) Pulse Rate: 78 (11/17 0757)  Labs: Recent Labs    08/10/24 0359 08/11/24 0653 08/12/24 0446  HGB 14.6 14.6 14.9  HCT 43.1 43.2 43.3  PLT 244 240 248  HEPARINUNFRC 0.41 0.35 0.36  CREATININE 1.34* 1.58* 1.57*    Estimated Creatinine Clearance: 49.4 mL/min (A) (by C-G formula based on SCr of 1.57 mg/dL (H)).  Assessment: Patient presented with CC of abdominal pain and emesis. Found to have potential SBO and possible sigmoid volvulus. Potential for sigmoid colectomy. Known hx of Afib on Eliquis PTA, last dose 11/11 @ noon. Pharmacy consulted to dose heparin gtt in peri-op time period.   Heparin level therapeutic at 0.36 on 1250 units/hr. No bleeding noted, CBC normal/stable.  Goal of Therapy:  Heparin level 0.3-0.7 units/ml Monitor platelets by anticoagulation protocol: Yes   Plan:  Continue heparin infusion at 1250 units/hr   F/u daily heparin level, cbc and s/sx of bleeding F/u plans to transition back to Eliquis  Thank you for involving pharmacy in this patient's care.  Delon Sax, PharmD, BCPS Clinical Pharmacist Clinical phone for 08/12/2024 is [phone] 08/12/2024 8:59 AM

## 2024-08-12 NOTE — Progress Notes (Addendum)
 5 Days Post-Op  Subjective: CC: NGT out. Drinking CLD. No abdominal pain or n/v. Passing flatus. BM yesterday.   Objective: Vital signs in last 24 hours: Temp:  [97.9 F (36.6 C)-98.6 F (37 C)] 98.1 F (36.7 C) (11/17 0757) Pulse Rate:  [71-88] 78 (11/17 0757) Resp:  [16-20] 19 (11/17 0757) BP: (113-140)/(84-100) 126/96 (11/17 0757) SpO2:  [98 %-99 %] 99 % (11/17 0757) Weight:  [79.5 kg] 79.5 kg (11/17 0500) Last BM Date : 08/12/24  Intake/Output from previous day: 11/16 0701 - 11/17 0700 In: 249 [I.V.:249] Out: 1650 [Urine:900; Emesis/NG output:750] Intake/Output this shift: Total I/O In: 171.2 [I.V.:171.2] Out: 1400 [Urine:200; Emesis/NG output:1200]  PE: Gen:  Alert, NAD, pleasant Pulm:  CTAB, no W/R/R, effort normal Abd: Soft, mild distension, NT  Lab Results:  Recent Labs    08/11/24 0653 08/12/24 0446  WBC 10.3 9.8  HGB 14.6 14.9  HCT 43.2 43.3  PLT 240 248   BMET Recent Labs    08/11/24 0653 08/12/24 0446  NA 139 134*  K 4.0 3.6  CL 102 98  CO2 24 24  GLUCOSE 79 74  BUN 25* 29*  CREATININE 1.58* 1.57*  CALCIUM 9.2 9.3   PT/INR No results for input(s): LABPROT, INR in the last 72 hours. CMP     Component Value Date/Time   NA 134 (L) 08/12/2024 0446   NA 141 02/28/2018 1538   K 3.6 08/12/2024 0446   CL 98 08/12/2024 0446   CO2 24 08/12/2024 0446   GLUCOSE 74 08/12/2024 0446   BUN 29 (H) 08/12/2024 0446   BUN 7 02/28/2018 1538   CREATININE 1.57 (H) 08/12/2024 0446   CALCIUM 9.3 08/12/2024 0446   PROT 6.9 08/08/2024 0447   PROT 7.3 06/08/2017 1115   ALBUMIN 3.2 (L) 08/08/2024 0447   ALBUMIN 4.6 06/08/2017 1115   AST 32 08/08/2024 0447   ALT 55 (H) 08/08/2024 0447   ALKPHOS 78 08/08/2024 0447   BILITOT 1.0 08/08/2024 0447   BILITOT 0.4 06/08/2017 1115   GFRNONAA 50 (L) 08/12/2024 0446   GFRAA >60 05/15/2019 1345   Lipase     Component Value Date/Time   LIPASE 46 08/07/2024 0306    Studies/Results: DG Abd Portable  1V Result Date: 08/11/2024 EXAM: 1 VIEW XRAY OF THE ABDOMEN 08/11/2024 10:23:00 PM COMPARISON: None available. CLINICAL HISTORY: Encounter for imaging study to confirm nasogastric (NG) tube placement. FINDINGS: LINES, TUBES AND DEVICES: Nasogastric tube tip overlies the expected distal body of the stomach. BOWEL: Normal abdominal gas pattern. No free intraperitoneal gas. SOFT TISSUES: No opaque urinary calculi. BONES: No acute osseous abnormality. IMPRESSION: 1. Nasogastric tube tip overlies the expected distal body of the stomach. Electronically signed by: Dorethia Molt MD 08/11/2024 10:42 PM EST RP Workstation: [id number]   DG Abd 1 View Result Date: 08/11/2024 CLINICAL DATA:  Abdominal pain.  Abdominal distension. EXAM: ABDOMEN - 1 VIEW COMPARISON:  08/10/2024 FINDINGS: NG tube tip is in the distal stomach. Gaseous distention of bowel in the upper abdomen is similar to prior. Visualized bony anatomy unremarkable. IMPRESSION: NG tube tip is in the distal stomach. No substantial interval change in bowel gas pattern. Electronically Signed   By: Camellia Candle M.D.   On: 08/11/2024 07:50    Anti-infectives: Anti-infectives (From admission, onward)    None        Assessment/Plan Bowel obstruction - Reports surgery in 1990s in [place]  for colon ca with colonic resection and ostomy followed by ostomy reversal.  Colonoscopy from 2021 showed 3 possible anastomosis, at rectosigmoid, at 20 cm and again at ileocolonic anastomosis.  - CT 11/12 with Sigmoid colon appears to extend into the right abdomen where there is a relative area of narrowing with associated twisting of the mesentery in the right abdomen just right of midline. The relative area of narrowing then becomes more prominent where there is another suture line over this bowel loop in the anterior mid abdomen as this measures approximately 13.3 cm in diameter.  - Flex sig 11/12 w/ lumen of the rectum, recto- sigmoid colon and  sigmoid colon moderately dilated. Decompression performed and post decompression abdomen was soft. - NGT out, on CLD, having bowel function, NT on exam, HDS and WBC wnl. No indication for emergency surgery. Defer further workup to GI. We will follow peripherally moving forward. Please call back sooner with questions or concerns.    FEN - CLD, IVF per [facility] VTE - SCDs, heparin gtt ID - None   - per admitting -  CHF - TEE 11/3 with EF <20%, ECHO 10/28 with EF 25-30% A fib on eliquis - s/p cardioversion 11/6 COPD HTN Hx of alcohol abuse, now sober Hx of tobacco abuse Hx of right sided colon cancer s/p resection in the 1990s Hepatic cirrhosis   I reviewed nursing notes, Consultant GI notes, last 24 h vitals and pain scores, last 48 h intake and output, last 24 h labs and trends, and last 24 h imaging results.    LOS: 4 days    Benjamin Abbott, [facility] 08/12/2024, 10:16 AM Please see Amion for pager number during day hours 7:00am-4:30pm

## 2024-08-12 NOTE — Progress Notes (Signed)
 PROGRESS NOTE   Benjamin Abbott  FMW:[id number]    DOB: May 21, 1961    DOA: 08/07/2024  PCP: Leontine Cramp, NP   I have briefly reviewed patients previous medical records in [facility].   Brief Hospital Course:  63 year old male with medical history significant for colon cancer, s/p multiple surgeries, right colectomy, ostomy followed by ostomy reversal, prior colonoscopy showing 3 possible anastomosis, HTN, PAF on DOAC, COPD, alcohol and tobacco use disorder, presented to the ED with complaints of abdominal pain, nausea and vomiting.  CT abdomen concerning for sigmoid volvulus.  Underwent emergent sigmoidoscopy without typical changes of volvulus, probably prepped bowel.  Follow-up abdominal x-ray showing small bowel distention.  Plan on bowel prep including enemas, MiraLAX via NG tube, serial abdominal x-rays and consideration for colonoscopy.  [facility] following.  Treated supportively with bowel rest, NG tube, aggressive bowel regimen including enemas, MiraLAX via NG tube, did well with prolonged NG tube clamping trials yesterday.  Discontinuing NG tube and trial of clear liquids today.   Assessment & Plan:   Large bowel obstruction. History as noted above.  History of multiple abdominal surgeries. [facility] consultation and follow-up appreciated. Emergent sigmoidoscopy 11/12 did not show typical features for sigmoid volvulus but the bowel prep was poor. Treated supportively with bowel rest n.p.o., NG tube to low intermittent wall suction, IVF, enemas, MiraLAX. Despite his imaging i.e. abdominal x-ray and CT abdomen showing significant gaseous distention, clinically he looks better than his imaging.  Did well with prolonged NG tube clamping trials yesterday.  Ongoing BMs.  Surgery has signed off.  GI follow-up appreciated, do not see role for full colonoscopy, discontinuing NG tube and trial of clear liquids.  Acute kidney injury complicating stage IIIa CKD NAGMA Baseline  creatinine probably in the 1.3-1.4 range.  Presented with creatinine of 1.8.  Suspected due to poor oral intake, nausea and vomiting AKI resolved after IV fluids.-Slightly up from 1.34-1.58.  Continue gentle IVF and follow BMP in AM. Avoid hypotension and nephrotoxic meds.  Stable.  Continue to trend daily BMP.  Paroxysmal atrial fibrillation Rate controlled on p.o. amiodarone and digoxin, continue n.p.o. except meds and NG tube clamping for an hour post meds Eliquis held in case warrants procedures.  Continue IV heparin bridging per pharmacy. Underwent cardioversion during last hospitalization on 11/3 and 11/6 but reverted back to A-fib.  Chronic systolic CHF Remains clinically euvolemic. 2D echo 11/3: LVEF <20%, LV global hypokinesis. Holding Entresto and diuretics for now.  Hepatic cirrhosis Noted  Asthma Stable  History of alcohol and tobacco use disorder Reports that he quit smoking and drinking about 6 months ago.  History of colon cancer History as noted above.   Body mass index is 25.88 kg/m.   DVT prophylaxis:   On IV heparin drip   Code Status: Full Code:  Family Communication: None at bedside Disposition:  Removing NG tube today, trial of clear liquids.  Pending continued clinical improvement, advancing diet, potentially could DC home in the next 24 to 48 hours.     Consultants:   [facility]  Procedures:   NG tube clamping trials.    Subjective:  No complaints.  Had BM last night and this morning.  Objective:   Vitals:   08/11/24 1950 08/12/24 0420 08/12/24 0500 08/12/24 0757  BP: (!) 113/91 118/84  (!) 126/96  Pulse: 88 71  78  Resp: 20   19  Temp: 98.2 F (36.8 C) 98.6 F (  37 C)  98.1 F (36.7 C)  TempSrc: Oral Oral    SpO2: 99% 98%  99%  Weight:   79.5 kg   Height:        General exam: Young male, moderately built and nourished sitting up comfortably in bed. Respiratory system: Clear to auscultation. Respiratory effort  normal.  Stable. Cardiovascular system: S1 & S2 heard, irregularly irregular. No JVD, murmurs, rubs, gallops or clicks. No pedal edema.  Telemetry personally reviewed: A-fib with controlled ventricular rate. Gastrointestinal system: Midline laparotomy scar and surgical scar in the RLQ.  Abdomen is nondistended, soft and nontender.  No organomegaly or masses appreciated.  Normal bowel sounds heard.  Still had NG tube in place when I saw him this morning. Central nervous system: Alert and oriented. No focal neurological deficits. Extremities: Symmetric 5 x 5 power. Skin: No rashes, lesions or ulcers Psychiatry: Judgement and insight appear normal. Mood & affect appropriate.  ENT: 11/16: Throat exam with a tongue depressor and flashlight unremarkable.  No acute findings noted.  May be throat discomfort is related to the NG tube.  Abdominal x-ray shows NG tube is appropriately placed in the distal stomach.    Data Reviewed:   I have personally reviewed following labs and imaging studies   CBC: Recent Labs  Lab 08/10/24 0359 08/11/24 0653 08/12/24 0446  WBC 8.2 10.3 9.8  HGB 14.6 14.6 14.9  HCT 43.1 43.2 43.3  MCV 83.9 83.2 83.0  PLT 244 240 248    Basic Metabolic Panel: Recent Labs  Lab 08/08/24 0447 08/09/24 0223 08/10/24 0359 08/11/24 0653 08/12/24 0446  NA 134* 139 135 139 134*  K 4.8 4.3 3.9 4.0 3.6  CL 108 108 102 102 98  CO2 17* 20* 21* 24 24  GLUCOSE 106* 91 73 79 74  BUN 31* 27* 24* 25* 29*  CREATININE 1.36* 1.32* 1.34* 1.58* 1.57*  CALCIUM 8.8* 9.2 9.1 9.2 9.3    Liver Function Tests: Recent Labs  Lab 08/07/24 0306 08/08/24 0447  AST 33 32  ALT 56* 55*  ALKPHOS 83 78  BILITOT 0.6 1.0  PROT 7.5 6.9  ALBUMIN 3.6 3.2*    CBG: No results for input(s): GLUCAP in the last 168 hours.  Microbiology Studies:  No results found for this or any previous visit (from the past 240 hours).  Radiology Studies:  DG Abd Portable 1V Result Date: 08/11/2024 EXAM:  1 VIEW XRAY OF THE ABDOMEN 08/11/2024 10:23:00 PM COMPARISON: None available. CLINICAL HISTORY: Encounter for imaging study to confirm nasogastric (NG) tube placement. FINDINGS: LINES, TUBES AND DEVICES: Nasogastric tube tip overlies the expected distal body of the stomach. BOWEL: Normal abdominal gas pattern. No free intraperitoneal gas. SOFT TISSUES: No opaque urinary calculi. BONES: No acute osseous abnormality. IMPRESSION: 1. Nasogastric tube tip overlies the expected distal body of the stomach. Electronically signed by: Dorethia Molt MD 08/11/2024 10:42 PM EST RP Workstation: [id number]   DG Abd 1 View Result Date: 08/11/2024 CLINICAL DATA:  Abdominal pain.  Abdominal distension. EXAM: ABDOMEN - 1 VIEW COMPARISON:  08/10/2024 FINDINGS: NG tube tip is in the distal stomach. Gaseous distention of bowel in the upper abdomen is similar to prior. Visualized bony anatomy unremarkable. IMPRESSION: NG tube tip is in the distal stomach. No substantial interval change in bowel gas pattern. Electronically Signed   By: Camellia Candle M.D.   On: 08/11/2024 07:50    Scheduled Meds:    amiodarone  200 mg Oral BID   digoxin  0.125  mg Oral Daily   empagliflozin  10 mg Oral Daily   fluticasone  furoate-vilanterol  1 puff Inhalation Daily   gabapentin   300 mg Oral TID   hydrOXYzine  25 mg Oral QHS   pneumococcal 20-valent conjugate vaccine  0.5 mL Intramuscular Tomorrow-1000   sodium chloride  flush  3 mL Intravenous Q12H    Continuous Infusions:    heparin 1,250 Units/hr (08/12/24 0826)   lactated ringers  50 mL/hr at 08/12/24 0827     LOS: 4 days     Trenda Mar, MD,  FACP, [facility], [facility], [facility]   [facility] [place]      To contact the attending provider between 7A-7P or the covering provider during after hours 7P-7A, please log into the web site [email or web address] and access using universal  password for that web site. If you do not have the  password, please call the hospital operator.  08/12/2024, 2:06 PM

## 2024-08-12 NOTE — Progress Notes (Signed)
 [facility] Progress Note  Benjamin Abbott 63 y.o. 01-26-1961  CC: Colonic obstruction/pseudoobstruction   Subjective: Patient seen and examined at bedside.  He is feeling better.  Ambulating in the hallway.  Last bowel movement this morning.  Passing gas.  Denies abdominal pain.  ROS : Afebrile, negative for nausea and vomiting   Objective: Vital signs in last 24 hours: Vitals:   08/12/24 0420 08/12/24 0757  BP: 118/84 (!) 126/96  Pulse: 71 78  Resp:  19  Temp: 98.6 F (37 C) 98.1 F (36.7 C)  SpO2: 98% 99%    Physical Exam: Resting comfortably, somewhat annoyed by NG tube but not in acute distress.  Abdomen is mildly distended, soft, bowel sound present, no peritoneal signs.  Mood and affect normal.  Alert and oriented x 3.  Lab Results: Recent Labs    08/11/24 0653 08/12/24 0446  NA 139 134*  K 4.0 3.6  CL 102 98  CO2 24 24  GLUCOSE 79 74  BUN 25* 29*  CREATININE 1.58* 1.57*  CALCIUM 9.2 9.3   No results for input(s): AST, ALT, ALKPHOS, BILITOT, PROT, ALBUMIN in the last 72 hours. Recent Labs    08/11/24 0653 08/12/24 0446  WBC 10.3 9.8  HGB 14.6 14.9  HCT 43.2 43.3  MCV 83.2 83.0  PLT 240 248   No results for input(s): LABPROT, INR in the last 72 hours.    Assessment/Plan: -Denies abdominal pain, nausea and constipation in a patient with history of colon cancer.  Abnormal CT scan on admission concerning for dilated sigmoid colon with initial concern for volvulus.  Underwent flex sig on August 07, 2024 which showed inadequate prep and no evidence of volvulus however flex sig did show dilated colonic lumen.  Repeat CT August 10, 2024 showed persistent dilated loop of the colon and focal narrowing of the bowel in anterior mid abdomen likely from internal hernia.  Recommendations ----------------------------- - Patient passing gas and having bowel movements.  Abdominal exam is benign with soft abdomen with mild  distention. -Remove NG tube -Trial of clear liquid diet -I am not sure doing full colonoscopy will add anything into the existing management plan as area of concern was in the sigmoid colon which was already evaluated by flex sig. -GI will follow   Layla Lah MD, FACP 08/12/2024, 8:32 AM  Contact #  [phone]

## 2024-08-13 DIAGNOSIS — K56600 Partial intestinal obstruction, unspecified as to cause: Secondary | ICD-10-CM | POA: Diagnosis not present

## 2024-08-13 LAB — HEPARIN LEVEL (UNFRACTIONATED): Heparin Unfractionated: 0.24 [IU]/mL — ABNORMAL LOW (ref 0.30–0.70)

## 2024-08-13 LAB — CBC
HCT: 39.2 % (ref 39.0–52.0)
Hemoglobin: 13.4 g/dL (ref 13.0–17.0)
MCH: 28.3 pg (ref 26.0–34.0)
MCHC: 34.2 g/dL (ref 30.0–36.0)
MCV: 82.9 fL (ref 80.0–100.0)
Platelets: 228 K/uL (ref 150–400)
RBC: 4.73 MIL/uL (ref 4.22–5.81)
RDW: 14.1 % (ref 11.5–15.5)
WBC: 9.6 K/uL (ref 4.0–10.5)
nRBC: 0 % (ref 0.0–0.2)

## 2024-08-13 LAB — BASIC METABOLIC PANEL WITH GFR
Anion gap: 9 (ref 5–15)
BUN: 19 mg/dL (ref 8–23)
CO2: 26 mmol/L (ref 22–32)
Calcium: 8.7 mg/dL — ABNORMAL LOW (ref 8.9–10.3)
Chloride: 97 mmol/L — ABNORMAL LOW (ref 98–111)
Creatinine, Ser: 1.2 mg/dL (ref 0.61–1.24)
GFR, Estimated: >60 mL/min (ref >60)
Glucose, Bld: 95 mg/dL (ref 70–99)
Potassium: 3.1 mmol/L — ABNORMAL LOW (ref 3.5–5.1)
Sodium: 132 mmol/L — ABNORMAL LOW (ref 135–145)

## 2024-08-13 LAB — MAGNESIUM: Magnesium: 2 mg/dL (ref 1.7–2.4)

## 2024-08-13 MED ORDER — POTASSIUM CHLORIDE 20 MEQ PO PACK
40.0000 meq | PACK | ORAL | Status: AC
  Administered 2024-08-13 (×2): 40 meq via ORAL
  Filled 2024-08-13 (×2): qty 2

## 2024-08-13 MED ORDER — POLYETHYLENE GLYCOL 3350 17 G PO PACK: 17.0000 g | PACK | Freq: Every day | ORAL | 0 refills | Status: AC

## 2024-08-13 MED ORDER — APIXABAN 5 MG PO TABS
5.0000 mg | ORAL_TABLET | Freq: Two times a day (BID) | ORAL | Status: DC
  Administered 2024-08-13: 5 mg via ORAL
  Filled 2024-08-13: qty 1

## 2024-08-13 MED ORDER — POLYETHYLENE GLYCOL 3350 17 G PO PACK
17.0000 g | PACK | Freq: Every day | ORAL | Status: DC
  Administered 2024-08-13: 17 g via ORAL
  Filled 2024-08-13: qty 1

## 2024-08-13 NOTE — Progress Notes (Addendum)
 PHARMACY - ANTICOAGULATION  Pharmacy Consult for heparin>>apixaban Indication: atrial fibrillation Brief A/P: Heparin level subtherapeutic Increase Heparin rate  Allergies  Allergen Reactions   Gadolinium Derivatives Shortness Of Breath    Bronchospasms-Pt asthmatic and had albuterol  inhaler, which he used and it broke the spasms. 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,RN    Patient Measurements: Height: 5' 9 (175.3 cm) Weight: 79.5 kg (175 lb 4.3 oz) IBW/kg (Calculated) : 70.7 HEPARIN DW (KG): 85.1  Vital Signs: Temp: 98.4 F (36.9 C) (11/18 0315) Temp Source: Oral (11/18 0315) BP: 116/68 (11/18 0315) Pulse Rate: 80 (11/18 0315)  Labs: Recent Labs    08/11/24 0653 08/12/24 0446 08/13/24 0358  HGB 14.6 14.9 13.4  HCT 43.2 43.3 39.2  PLT 240 248 228  HEPARINUNFRC 0.35 0.36 0.24*  CREATININE 1.58* 1.57* 1.20    Estimated Creatinine Clearance: 64.6 mL/min (by C-G formula based on SCr of 1.2 mg/dL).  Assessment: 63 y.o. male with h/o Afib, Eliquis on hold, for heparin   Addendum  Ok to transition back to apixaban per Dr. Judeth.  Goal of Therapy:  Monitor platelets by anticoagulation protocol: Yes   Plan:  Heparin>>resume apixaban 5mg  BID  Sergio Batch, PharmD, BCIDP, AAHIVP, CPP Infectious Disease Pharmacist 08/13/2024 9:19 AM

## 2024-08-13 NOTE — Discharge Summary (Signed)
 Physician Discharge Summary  Benjamin Abbott FMW:[id number] DOB: [birth year]  PCP: Leontine Cramp, NP  Admitted from: Home Discharged to: Home  Admit date: 08/07/2024 Discharge date: 08/13/2024  Recommendations for Outpatient Follow-up:    Follow-up Information     Gastroenterology, [facility]. Schedule an appointment as soon as possible for a visit in 2 month(s).   Why: Post hospitalization follow-up Contact information: [address] [place] [address] [phone]         Leontine Cramp, NP Follow up in 1 week(s).   Specialty: Nurse Practitioner Why: To be seen with repeat labs (CBC & BMP). Contact information: [address] [place] [address] [phone]                  Home Health: None    Equipment/Devices: None    Discharge Condition: Improved and stable   Code Status: Full Code Diet recommendation:  Discharge Diet Orders (From admission, onward)     Start     Ordered   08/13/24 0000  Diet - low sodium heart healthy       Comments: Recommend soft diet for a few days and then as tolerated, advance to regular consistency diet.   08/13/24 1454             Discharge Diagnoses:  Principal Problem:   Partial bowel obstruction (HCC) Active Problems:   Acute kidney injury superimposed on chronic kidney disease   Paroxysmal atrial fibrillation (HCC)   Chronic systolic CHF (congestive heart failure) (HCC)   Essential hypertension   Asthma   History of alcohol abuse   History of tobacco abuse   Brief Hospital Course:  63 year old male with medical history significant for colon cancer, s/p multiple surgeries, right colectomy, ostomy followed by ostomy reversal, prior colonoscopy showing 3 possible anastomosis, HTN, PAF on DOAC, COPD, alcohol and tobacco use disorder, presented to the ED with complaints of abdominal pain, nausea and vomiting.  CT abdomen concerning for sigmoid volvulus.  Underwent emergent sigmoidoscopy without typical  changes of volvulus, probably prepped bowel.  Follow-up abdominal x-ray showing small bowel distention.  Plan on bowel prep including enemas, MiraLAX via NG tube, serial abdominal x-rays and consideration for colonoscopy.  [facility] following.   Treated supportively with bowel rest, NG tube, aggressive bowel regimen including enemas, MiraLAX via NG tube, did well with prolonged NG tube clamping, NG tube was discontinued, diet was gradually advanced, patient has tolerated soft diet today, GI has seen and cleared for DC home.     Assessment & Plan:    Large bowel obstruction. History as noted above.  History of multiple abdominal surgeries. [facility] consultation and follow-up appreciated. Emergent sigmoidoscopy 11/12 did not show typical features for sigmoid volvulus but the bowel prep was poor. Treated supportively with bowel rest n.p.o., NG tube to low intermittent wall suction, IVF, enemas, MiraLAX. Despite his imaging i.e. abdominal x-ray and CT abdomen showing significant gaseous distention, clinically he looked better than his imaging.  He did well with prolonged NG tube clamping trials.  Ongoing BMs.  Surgery has signed off.  His NG tube was removed yesterday.  Diet was gradually advanced.  He has done quite well.  He has tolerated soft diet without complaints.  Continues to have BMs. GI follow-up and communication appreciated.  They recommend daily MiraLAX and have cleared him for discharge home and plan to see him in the office in 2 months.  Patient has been advised to seek  immediate medical attention if he has recurrent symptoms and he verbalized understanding.   Acute kidney injury complicating stage IIIa CKD NAGMA Baseline creatinine probably in the 1.3-1.4 range.  Presented with creatinine of 1.8.  Suspected due to poor oral intake, nausea and vomiting AKI resolved after IV fluids. Due to recent AKI, clinically euvolemic status, continue to hold Entresto, furosemide and  spironolactone for a couple of days prior to resumption.  Follow-up BMP as outpatient.   Paroxysmal atrial fibrillation He was continued on amiodarone and digoxin.  Eliquis was transition to IV heparin bridge in case he warranted any procedures.  Now that he is tolerating diet, he was transitioned back to Eliquis.  Continue prior home meds at discharge.   Underwent cardioversion during last hospitalization on 11/3 and 11/6 but reverted back to A-fib.   Chronic systolic CHF Remains clinically euvolemic. 2D echo 11/3: LVEF <20%, LV global hypokinesis. See discussion above regarding holding meds temporarily for a couple days prior to resuming.   Hepatic cirrhosis Noted   Asthma Stable.  Continue prior home regimen.   History of alcohol and tobacco use disorder Reports that he quit smoking and drinking about 6 months ago.   History of colon cancer History as noted above.  Hypokalemia Replaced prior to discharge.  Magnesium normal.  Outpatient follow-up.    Body mass index is 25.88 kg/m.      Consultants:   [facility]   Procedures:   NG tube clamping trials.    Discharge Instructions  Discharge Instructions     (HEART FAILURE PATIENTS) Call MD:  Anytime you have any of the following symptoms: 1) 3 pound weight gain in 24 hours or 5 pounds in 1 week 2) shortness of breath, with or without a dry hacking cough 3) swelling in the hands, feet or stomach 4) if you have to sleep on extra pillows at night in order to breathe.   Complete by: As directed    Call MD for:  difficulty breathing, headache or visual disturbances   Complete by: As directed    Call MD for:  extreme fatigue   Complete by: As directed    Call MD for:  persistant dizziness or light-headedness   Complete by: As directed    Call MD for:  persistant nausea and vomiting   Complete by: As directed    Call MD for:  severe uncontrolled pain   Complete by: As directed    Call MD for:  temperature  >100.4   Complete by: As directed    Diet - low sodium heart healthy   Complete by: As directed    Recommend soft diet for a few days and then as tolerated, advance to regular consistency diet.   Increase activity slowly   Complete by: As directed         Medication List     PAUSE taking these medications    Entresto 24-26 MG Wait to take this until: August 16, 2024 Generic drug: sacubitril-valsartan Take 1 tablet by mouth 2 (two) times daily.   furosemide 40 MG tablet Wait to take this until: August 16, 2024 Commonly known as: LASIX Take 1 tablet (40 mg total) by mouth daily.   spironolactone 25 MG tablet Wait to take this until: August 16, 2024 Commonly known as: ALDACTONE Take 1 tablet (25 mg total) by mouth daily.       TAKE these medications    albuterol  (2.5 MG/3ML) 0.083% nebulizer solution Commonly known as:  PROVENTIL  Take 2.5 mg by nebulization every 6 (six) hours as needed for wheezing or shortness of breath.   albuterol  108 (90 Base) MCG/ACT inhaler Commonly known as: VENTOLIN  HFA Inhale 2 puffs into the lungs every 6 (six) hours as needed for wheezing or shortness of breath.   amiodarone 200 MG tablet Commonly known as: PACERONE Take 1 tablet (200 mg total) by mouth 2 (two) times daily.   cetirizine 10 MG tablet Commonly known as: ZYRTEC Take 10 mg by mouth daily.   cyclobenzaprine  10 MG tablet Commonly known as: FLEXERIL  Take 10 mg by mouth at bedtime as needed for muscle spasms.   digoxin 0.125 MG tablet Commonly known as: LANOXIN Take 1 tablet (0.125 mg total) by mouth daily.   Eliquis 5 MG Tabs tablet Generic drug: apixaban Take 1 tablet (5 mg total) by mouth 2 (two) times daily.   fluticasone  50 MCG/ACT nasal spray Commonly known as: FLONASE Place 1 spray into both nostrils daily as needed for allergies.   Fluticasone -Salmeterol 250-50 MCG/DOSE Aepb Commonly known as: ADVAIR Inhale 1 puff into the lungs 2 (two) times  daily.   gabapentin  300 MG capsule Commonly known as: Neurontin  Take 1 capsule (300 mg total) by mouth 3 (three) times daily.   hydrOXYzine 25 MG tablet Commonly known as: ATARAX Take 25 mg by mouth at bedtime.   Jardiance 10 MG Tabs tablet Generic drug: empagliflozin Take 1 tablet (10 mg total) by mouth daily.   montelukast 10 MG tablet Commonly known as: SINGULAIR Take 10 mg by mouth daily as needed (allergies).   ondansetron  4 MG tablet Commonly known as: Zofran  Take 1 tablet (4 mg total) by mouth every 8 (eight) hours as needed for nausea or vomiting.   polyethylene glycol 17 g packet Commonly known as: MIRALAX / GLYCOLAX Take 17 g by mouth daily. Start taking on: August 14, 2024   triamcinolone cream 0.1 % Commonly known as: KENALOG Apply 1 Application topically daily as needed (for rash).   Vitamin D 50 MCG (2000 UT) tablet Take 2,000 Units by mouth daily.       Allergies  Allergen Reactions   Gadolinium Derivatives Shortness Of Breath    Bronchospasms-Pt asthmatic and had albuterol  inhaler, which he used and it broke the spasms. 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,RN      Procedures/Studies: DG Abd Portable 1V Result Date: 08/11/2024 EXAM: 1 VIEW XRAY OF THE ABDOMEN 08/11/2024 10:23:00 PM COMPARISON: None available. CLINICAL HISTORY: Encounter for imaging study to confirm nasogastric (NG) tube placement. FINDINGS: LINES, TUBES AND DEVICES: Nasogastric tube tip overlies the expected distal body of the stomach. BOWEL: Normal abdominal gas pattern. No free intraperitoneal gas. SOFT TISSUES: No opaque urinary calculi. BONES: No acute osseous abnormality. IMPRESSION: 1. Nasogastric tube tip overlies the expected distal body of the stomach. Electronically signed by: Dorethia Molt MD 08/11/2024 10:42 PM EST RP Workstation: [id number]   DG Abd 1 View Result Date: 08/11/2024 CLINICAL DATA:  Abdominal pain.  Abdominal distension. EXAM:  ABDOMEN - 1 VIEW COMPARISON:  08/10/2024 FINDINGS: NG tube tip is in the distal stomach. Gaseous distention of bowel in the upper abdomen is similar to prior. Visualized bony anatomy unremarkable. IMPRESSION: NG tube tip is in the distal stomach. No substantial interval change in bowel gas pattern. Electronically Signed   By: Camellia Candle M.D.   On: 08/11/2024 07:50   DG Abd 1 View Result Date: 08/10/2024 CLINICAL DATA:  Small bowel obstruction. EXAM: ABDOMEN -  1 VIEW COMPARISON:  08/09/2024 CT scan and plain film exam FINDINGS: Gas dilated bowel loop in the upper abdomen is again noted, similar to previous imaging. NG tube remains in the stomach. Diffuse gas-filled small bowel. Contrast material in the bladder lumen compatible with CT imaging earlier today. IMPRESSION: Persistent gas dilated bowel loop in the upper abdomen with diffuse gas-filled small bowel. No substantial interval change. Electronically Signed   By: Camellia Candle M.D.   On: 08/10/2024 07:36   CT ABDOMEN PELVIS W CONTRAST Result Date: 08/10/2024 EXAM: CT ABDOMEN AND PELVIS WITH CONTRAST 08/10/2024 12:21:46 AM TECHNIQUE: CT of the abdomen and pelvis was performed with the administration of 75 mL of iohexol  (OMNIPAQUE ) 350 MG/ML injection. Multiplanar reformatted images are provided for review. Automated exposure control, iterative reconstruction, and/or weight-based adjustment of the mA/kV was utilized to reduce the radiation dose to as low as reasonably achievable. COMPARISON: 08/07/2024 CLINICAL HISTORY: Bowel obstruction suspected. FINDINGS: LOWER CHEST: Mild cardiomegaly. Mild bibasilar atelectasis. LIVER: The liver is unremarkable. GALLBLADDER AND BILE DUCTS: Gallbladder is unremarkable. No biliary ductal dilatation. SPLEEN: No acute abnormality. PANCREAS: No acute abnormality. ADRENAL GLANDS: No acute abnormality. KIDNEYS, URETERS AND BLADDER: No stones in the kidneys or ureters. No hydronephrosis. No perinephric or periureteral  stranding. Urinary bladder is unremarkable. GI AND BOWEL: Enteric tube terminates in the distal gastric antrum. Status post left hemicolectomy with lower colonic suture line. Multiple dilated loops of colon in the anterior abdomen with narrowing of bowel in the anterior mid abdomen (image 36), possibly related to an internal hernia. Given rectosigmoid colonic changes, this appearance does not suggest classic sigmoid volvulus. Appendix is favored to be surgically absent. No pneumatosis. PERITONEUM AND RETROPERITONEUM: No ascites. No free air. VASCULATURE: Aorta is normal in caliber. LYMPH NODES: No lymphadenopathy. REPRODUCTIVE ORGANS: The prostate is unremarkable. BONES AND SOFT TISSUES: No acute osseous abnormality. No focal soft tissue abnormality. IMPRESSION: 1. Multiple dilated loops of colon in the anterior abdomen, recurrent status post endoscopic decompression. Focal narrowing of bowel in the anterior mid abdomen, possibly related to an internal hernia. Given rectosigmoid colonic surgery, this appearance does not suggest classic sigmoid volvulus. 2. No pneumatosis or free air. Electronically signed by: Pinkie Pebbles MD 08/10/2024 12:40 AM EST RP Workstation: [id number]   DG Abd Portable 1V Result Date: 08/09/2024 EXAM: 1 VIEW XRAY OF THE ABDOMEN 08/09/2024 05:50:18 AM COMPARISON: 08/08/2024, 08/07/2024 CLINICAL HISTORY: Bowel obstruction (HCC) FINDINGS: LINES, TUBES AND DEVICES: Enteric tube in place terminating in the region of the decompressed stomach. BOWEL: New, gaseously distended structure in the upper abdomen extending into the right upper quadrant. Nondistended segments of small bowel noted in the mid to lower abdomen. SOFT TISSUES: No opaque urinary calculi. BONES: No acute osseous abnormality. Scattered thoracolumbar osteophytosis. IMPRESSION: 1. Worsening, gaseously distended structure in the upper abdomen extending into the right upper quadrant, which corresponds to a significantly dilated  segment of small bowel previously noted on the prior CT just upstream from the sigmoid anastomosis. Given the appearance on the prior CT, where there was twisting of the bowel mesentery in this region, a follow-up CT of the abdomen and pelvis with iv contrast is recommended for further evaluation. These results will be called to the ordering clinician or representative by the radiologist assistant and communication documented in the pacs or clario dashboard. Electronically signed by: Rogelia Myers MD 08/09/2024 08:44 AM EST RP Workstation: [id number]   DG Abd Portable 1V Result Date: 08/08/2024 EXAM: 1 VIEW XRAY OF THE ABDOMEN 08/08/2024 06:14:26  AM COMPARISON: Portable abdomen film yesterday at 5:20 pm. CLINICAL HISTORY: Ileus (HCC). FINDINGS: LINES, TUBES AND DEVICES: NGT is partially coiled in the proximal stomach. BOWEL: Mild gastric air distention is again noted. Small bowel distention in the upper abdomen is again seen up to 3.5 cm. There is aerated stool in the rectum. The degree of bowel dilatation slightly improved today. SOFT TISSUES: No opaque urinary calculi. BONES: No acute osseous abnormality. IMPRESSION: 1. Small bowel distention in the upper abdomen up to 3.5 cm, slightly improved compared to the prior study. 2. Nasogastric tube partially coiled in the proximal stomach. 3. Mild gastric air distention. Electronically signed by: Francis Quam MD 08/08/2024 06:42 AM EST RP Workstation: [id number]   DG Abd Portable 1 View Result Date: 08/07/2024 CLINICAL DATA:  NG tube placement EXAM: PORTABLE ABDOMEN - 1 VIEW COMPARISON:  Radiograph and CT yesterday FINDINGS: Tip and side port of the enteric tube below the diaphragm in the stomach. Gaseous bowel distention in the upper abdomen, slightly diminished from yesterday. IMPRESSION: Tip and side port of the enteric tube below the diaphragm in the stomach. Electronically Signed   By: Andrea Gasman M.D.   On: 08/07/2024 17:49   DG Abd 1 View Result  Date: 08/07/2024 CLINICAL DATA:  Sigmoid volvulus EXAM: ABDOMEN - 1 VIEW COMPARISON:  CT abdomen and pelvis 08/07/2024 FINDINGS: There is a dilated loop of colon in the left and central abdomen measuring up to 16 cm similar to prior study. There is also gaseous distention of colon in the central and lower abdomen. Minimal small bowel gas identified. Is difficult to exclude free air on this supine view. No suspicious calcifications. There is contrast in the bladder. No acute fractures are identified. A surgical clip overlies the right lower quadrant. IMPRESSION: Unchanged gaseous dilated colon in the central and lower abdomen concerning for large bowel obstruction. Electronically Signed   By: Greig Pique M.D.   On: 08/07/2024 16:27   CT ABDOMEN PELVIS W CONTRAST Addendum Date: 08/07/2024 ADDENDUM REPORT: 08/07/2024 12:07 ADDENDUM: Above findings discussed with Dr. Saintclair as we discussed possibility of sigmoid volvulus. Electronically Signed   By: Toribio Agreste M.D.   On: 08/07/2024 12:07   Result Date: 08/07/2024 CLINICAL DATA:  Suspect bowel obstruction. Generalized abdominal pain with emesis and constipation. EXAM: CT ABDOMEN AND PELVIS WITH CONTRAST TECHNIQUE: Multidetector CT imaging of the abdomen and pelvis was performed using the standard protocol following bolus administration of intravenous contrast. RADIATION DOSE REDUCTION: This exam was performed according to the departmental dose-optimization program which includes automated exposure control, adjustment of the mA and/or kV according to patient size and/or use of iterative reconstruction technique. CONTRAST:  75mL OMNIPAQUE  IOHEXOL  350 MG/ML SOLN COMPARISON:  07/21/2024 FINDINGS: Lower chest: Mild stable cardiomegaly. Visualized lung bases are clear. Hepatobiliary: Gallbladder is contracted. Minimal low attenuation of the liver compatible with a degree of steatosis without focal mass. Biliary tree is unremarkable. Pancreas: Normal. Spleen:  Normal. Adrenals/Urinary Tract: Adrenal glands are normal. Kidneys are normal in size without hydronephrosis or nephrolithiasis. Ureters and bladder are normal. Stomach/Bowel: Stomach mildly distended with ingested contents. Proximal to mid small bowel is within normal. Several suture lines over the rectosigmoid colon. Moderate fecal retention over the rectosigmoid colon. Findings suggest subtotal colectomy. Sigmoid colon appears to extend into the right abdomen where there is a relative area of narrowing with associated twisting of the mesentery in the right abdomen just right of midline. The relative area of narrowing then becomes more prominent where  there is another suture line over this bowel loop in the anterior mid abdomen as this measures approximately 13.3 cm in diameter and is unchanged compared to the prior exam. It is difficult to follow the bowel loops through this area with there is twisting of the mesentery. There is likely a degree of partial obstruction through this area of marrow bowel in the right abdomen although it is not completely obstructive. There is no evidence of free fluid, focal inflammatory change or pneumatosis. Vascular/Lymphatic: Minimal plaque over the abdominal aorta which is normal caliber. Remaining vascular structures are unremarkable. No adenopathy. Reproductive: Prostate is unremarkable. Other: None. Musculoskeletal: No focal abnormality. IMPRESSION: 1. Multiple surgical changes of the bowel at least reflecting subtotal colectomy with several suture lines over the rectosigmoid colon. Sigmoid colon extends into the right abdomen where there is a relative area of narrowing with associated twisting of the mesentery. The relative narrowing then becomes dilated in the anterior mid abdomen measuring 13.3 cm in diameter as this is unchanged and likely represent a degree of partial obstruction. Recommend serial follow-up imaging. 2. Mild hepatic steatosis. 3. Aortic atherosclerosis.  Aortic Atherosclerosis (ICD10-I70.0). Electronically Signed: By: Toribio Agreste M.D. On: 08/07/2024 09:12   DG Chest Portable 1 View Result Date: 08/07/2024 CLINICAL DATA:  Shortness of breath. EXAM: PORTABLE CHEST 1 VIEW COMPARISON:  07/27/2024 FINDINGS: The cardio pericardial silhouette is enlarged. The lungs are clear without focal pneumonia, edema, pneumothorax or pleural effusion. No acute bony abnormality. Interval movable of right PICC line. Telemetry leads overlie the chest. IMPRESSION: Enlargement of the cardiopericardial silhouette. No acute cardiopulmonary findings. Electronically Signed   By: Camellia Candle M.D.   On: 08/07/2024 07:50     Subjective: Seen this morning.  Doing well.  Had tolerated liquid diet until then.  No abdominal pain or distention.  Had multiple BMs overnight.  No nausea or vomiting.  Since then, tolerated soft diet without complaints.  Discharge Exam:  Vitals:   08/12/24 0757 08/12/24 1505 08/12/24 2004 08/13/24 0315  BP: (!) 126/96 121/86 133/78 116/68  Pulse: 78 60 88 80  Resp: 19   15  Temp: 98.1 F (36.7 C) 97.7 F (36.5 C) 98 F (36.7 C) 98.4 F (36.9 C)  TempSrc:  Oral Oral Oral  SpO2: 99% 98% 99% 98%  Weight:      Height:        General exam: Young male, moderately built and nourished sitting up comfortably in bed. Respiratory system: Clear to auscultation. Respiratory effort normal.  Stable. Cardiovascular system: S1 & S2 heard, irregularly irregular. No JVD, murmurs, rubs, gallops or clicks. No pedal edema.  Telemetry personally reviewed: A-fib with controlled ventricular rate. Gastrointestinal system: Midline laparotomy scar and surgical scar in the RLQ.  Abdomen is nondistended, soft and nontender.  No organomegaly or masses appreciated.  Normal bowel sounds heard.  Central nervous system: Alert and oriented. No focal neurological deficits. Extremities: Symmetric 5 x 5 power. Skin: No rashes, lesions or ulcers Psychiatry: Judgement and  insight appear normal. Mood & affect appropriate.     The results of significant diagnostics from this hospitalization (including imaging, microbiology, ancillary and laboratory) are listed below for reference.     Microbiology: No results found for this or any previous visit (from the past 240 hours).   Labs: CBC: Recent Labs  Lab 08/09/24 0223 08/10/24 0359 08/11/24 0653 08/12/24 0446 08/13/24 0358  WBC 6.7 8.2 10.3 9.8 9.6  HGB 15.0 14.6 14.6 14.9 13.4  HCT 44.9  43.1 43.2 43.3 39.2  MCV 84.2 83.9 83.2 83.0 82.9  PLT 252 244 240 248 228    Basic Metabolic Panel: Recent Labs  Lab 08/09/24 0223 08/10/24 0359 08/11/24 0653 08/12/24 0446 08/13/24 0358  NA 139 135 139 134* 132*  K 4.3 3.9 4.0 3.6 3.1*  CL 108 102 102 98 97*  CO2 20* 21* 24 24 26   GLUCOSE 91 73 79 74 95  BUN 27* 24* 25* 29* 19  CREATININE 1.32* 1.34* 1.58* 1.57* 1.20  CALCIUM 9.2 9.1 9.2 9.3 8.7*  MG  --   --   --   --  2.0    Liver Function Tests: Recent Labs  Lab 08/07/24 0306 08/08/24 0447  AST 33 32  ALT 56* 55*  ALKPHOS 83 78  BILITOT 0.6 1.0  PROT 7.5 6.9  ALBUMIN 3.6 3.2*    Urinalysis    Component Value Date/Time   COLORURINE STRAW (A) 08/07/2024 0835   APPEARANCEUR CLEAR 08/07/2024 0835   LABSPEC 1.012 08/07/2024 0835   PHURINE 5.0 08/07/2024 0835   GLUCOSEU 150 (A) 08/07/2024 0835   HGBUR NEGATIVE 08/07/2024 0835   BILIRUBINUR NEGATIVE 08/07/2024 0835   KETONESUR NEGATIVE 08/07/2024 0835   PROTEINUR NEGATIVE 08/07/2024 0835   NITRITE NEGATIVE 08/07/2024 0835   LEUKOCYTESUR NEGATIVE 08/07/2024 0835      Time coordinating discharge: 35 minutes  SIGNED:  Trenda Mar, MD,  FACP, [facility], [facility], [facility]   [facility] [place]     To contact the attending provider between 7A-7P or the covering provider during after hours 7P-7A, please log into the web site [email or web address] and access using universal [place] password for that web  site. If you do not have the password, please call the hospital operator.

## 2024-08-13 NOTE — Care Management Important Message (Signed)
 Important Message  Patient Details  Name: Benjamin Abbott MRN: [id number] Date of Birth: 03/28/1961   Important Message Given:  Yes - Medicare IM     Jennie Laneta Dragon 08/13/2024, 1:06 PM

## 2024-08-13 NOTE — Progress Notes (Signed)
 [facility] Gastroenterology Progress Note  Benjamin Abbott 63 y.o. 1961/01/01  CC: Colonic obstruction/pseudoobstruction   Subjective: Patient seen and examined at bedside.  He was ambulating in the hallway without any discomfort during my visit.  Having bowel movements.  Passing gas.  Tolerating diet.  ROS : Afebrile, negative for nausea and vomiting   Objective: Vital signs in last 24 hours: Vitals:   08/12/24 2004 08/13/24 0315  BP: 133/78 116/68  Pulse: 88 80  Resp:  15  Temp: 98 F (36.7 C) 98.4 F (36.9 C)  SpO2: 99% 98%    Physical Exam: Resting comfortably, somewhat annoyed by NG tube but not in acute distress.  Abdomen is mildly distended, soft, bowel sound present, no peritoneal signs.  Mood and affect normal.  Alert and oriented x 3.  Lab Results: Recent Labs    08/12/24 0446 08/13/24 0358  NA 134* 132*  K 3.6 3.1*  CL 98 97*  CO2 24 26  GLUCOSE 74 95  BUN 29* 19  CREATININE 1.57* 1.20  CALCIUM 9.3 8.7*  MG  --  2.0   No results for input(s): AST, ALT, ALKPHOS, BILITOT, PROT, ALBUMIN in the last 72 hours. Recent Labs    08/12/24 0446 08/13/24 0358  WBC 9.8 9.6  HGB 14.9 13.4  HCT 43.3 39.2  MCV 83.0 82.9  PLT 248 228   No results for input(s): LABPROT, INR in the last 72 hours.    Assessment/Plan: -Denies abdominal pain, nausea and constipation in a patient with history of colon cancer.  Abnormal CT scan on admission concerning for dilated sigmoid colon with initial concern for volvulus.  Underwent flex sig on August 07, 2024 which showed inadequate prep and no evidence of volvulus however flex sig did show dilated colonic lumen.  Repeat CT August 10, 2024 showed persistent dilated loop of the colon and focal narrowing of the bowel in anterior mid abdomen likely from internal hernia.  Recommendations ----------------------------- - Patient is tolerating liquid diet.  Having bowel movements.  Denies nausea or vomiting. - Advance  diet to soft and slowly advance as tolerated - Add MiraLAX daily for constipation - Okay to resume Eliquis from GI standpoint - Follow-up with Dr. Saintclair in 2 months after discharge - GI will sign off.  Call us  back if needed.   Layla Lah MD, FACP 08/13/2024, 8:39 AM  Contact #  [phone]

## 2024-08-13 NOTE — Discharge Instructions (Addendum)
 Information on my medicine - ELIQUIS (apixaban)  This medication education was reviewed with me or my healthcare representative as part of my discharge preparation. Why was Eliquis prescribed for you? Eliquis was prescribed for you to reduce the risk of a blood clot forming that can cause a stroke if you have a medical condition called atrial fibrillation (a type of irregular heartbeat).  What do You need to know about Eliquis ? Take your Eliquis TWICE DAILY - one tablet in the morning and one tablet in the evening with or without food. If you have difficulty swallowing the tablet whole please discuss with your pharmacist how to take the medication safely.  Take Eliquis exactly as prescribed by your doctor and DO NOT stop taking Eliquis without talking to the doctor who prescribed the medication.  Stopping may increase your risk of developing a stroke.  Refill your prescription before you run out.  After discharge, you should have regular check-up appointments with your healthcare provider that is prescribing your Eliquis.  In the future your dose may need to be changed if your kidney function or weight changes by a significant amount or as you get older.  What do you do if you miss a dose? If you miss a dose, take it as soon as you remember on the same day and resume taking twice daily.  Do not take more than one dose of ELIQUIS at the same time to make up a missed dose.  Important Safety Information A possible side effect of Eliquis is bleeding. You should call your healthcare provider right away if you experience any of the following: Bleeding from an injury or your nose that does not stop. Unusual colored urine (red or dark brown) or unusual colored stools (red or black). Unusual bruising for unknown reasons. A serious fall or if you hit your head (even if there is no bleeding).  Some medicines may interact with Eliquis and might increase your risk of bleeding or clotting while  on Eliquis. To help avoid this, consult your healthcare provider or pharmacist prior to using any new prescription or non-prescription medications, including herbals, vitamins, non-steroidal anti-inflammatory drugs (NSAIDs) and supplements.  This website has more information on Eliquis (apixaban): http://www.eliquis.com/eliquis/home    Additional Discharge Instructions   Please get your medications reviewed and adjusted by your Primary MD.  Please request your Primary MD to go over all Hospital Tests and Procedure/Radiological results at the follow up, please get all Hospital records sent to your Primary MD by signing hospital release before you go home.  If you had Pneumonia of Lung problems at the Hospital: Please get a 2 view Chest X ray done in approximately 4 weeks after hospital discharge or sooner if instructed by your Primary MD.  If you have Congestive Heart Failure: Please call your Cardiologist or Primary MD anytime you have any of the following symptoms:  1) 3 pound weight gain in 24 hours or 5 pounds in 1 week  2) shortness of breath, with or without a dry hacking cough  3) swelling in the hands, feet or stomach  4) if you have to sleep on extra pillows at night in order to breathe  Follow cardiac low salt diet and 1.5 lit/day fluid restriction.  If you have diabetes Accuchecks 4 times/day, Once in AM empty stomach and then before each meal. Log in all results and show them to your primary doctor at your next visit. If any glucose reading is under 80 or above  300 call your primary MD immediately.  If you have Seizure/Convulsions/Epilepsy: Please do not drive, operate heavy machinery, participate in activities at heights or participate in high speed sports until you have seen by Primary MD or a Neurologist and advised to do so again. Per [facility] statutes, patients with seizures are not allowed to drive until they have been seizure-free for six months.  Use  caution when using heavy equipment or power tools. Avoid working on ladders or at heights. Take showers instead of baths. Ensure the water temperature is not too high on the home water heater. Do not go swimming alone. Do not lock yourself in a room alone (i.e. bathroom). When caring for infants or small children, sit down when holding, feeding, or changing them to minimize risk of injury to the child in the event you have a seizure. Maintain good sleep hygiene. Avoid alcohol.   If you had Gastrointestinal Bleeding: Please ask your Primary MD to check a complete blood count within one week of discharge or at your next visit. Your endoscopic/colonoscopic biopsies that are pending at the time of discharge, will also need to followed by your Primary MD.  Get Medicines reviewed and adjusted. Please take all your medications with you for your next visit with your Primary MD  Please request your Primary MD to go over all hospital tests and procedure/radiological results at the follow up, please ask your Primary MD to get all Hospital records sent to his/her office.  If you experience worsening of your admission symptoms, develop shortness of breath, life threatening emergency, suicidal or homicidal thoughts you must seek medical attention immediately by calling 911 or calling your MD immediately  if symptoms less severe.  You must read complete instructions/literature along with all the possible adverse reactions/side effects for all the Medicines you take and that have been prescribed to you. Take any new Medicines after you have completely understood and accpet all the possible adverse reactions/side effects.   Do not drive or operate heavy machinery when taking Pain medications.   Do not take more than prescribed Pain, Sleep and Anxiety Medications  Special Instructions: If you have smoked or chewed Tobacco  in the last 2 yrs please stop smoking, stop any regular Alcohol  and or any Recreational drug  use.  Wear Seat belts while driving.  Please note You were cared for by a hospitalist during your hospital stay. If you have any questions about your discharge medications or the care you received while you were in the hospital after you are discharged, you can call the unit and asked to speak with the hospitalist on call if the hospitalist that took care of you is not available. Once you are discharged, your primary care physician will handle any further medical issues. Please note that NO REFILLS for any discharge medications will be authorized once you are discharged, as it is imperative that you return to your primary care physician (or establish a relationship with a primary care physician if you do not have one) for your aftercare needs so that they can reassess your need for medications and monitor your lab values.  You can reach the hospitalist office at phone [phone] or fax [phone]   If you do not have a primary care physician, you can call [phone] for a physician referral.

## 2024-08-13 NOTE — TOC Transition Note (Signed)
 Transition of Care [facility]) - Discharge Note   Patient Details  Name: Benjamin Abbott MRN: [id number] Date of Birth: 12-18-1960  [facility] CM/SW Contact:  Landry DELENA Senters, RN Phone Number: 08/13/2024, 3:11 PM   Clinical Narrative:     Patient discharging to home with transportation. No further needs per therapy recommendations. No further needs from CM.   Final next level of care: Home/Self Care Barriers to Discharge: No Barriers Identified   Patient Goals and CMS Choice            Discharge Placement                       Discharge Plan and Services Additional resources added to the After Visit Summary for                                       Social Drivers of Health (SDOH) Interventions SDOH Screenings   Food Insecurity: No Food Insecurity (08/07/2024)  Housing: Low Risk  (08/07/2024)  Transportation Needs: No Transportation Needs (08/07/2024)  Utilities: Not At Risk (08/07/2024)  Alcohol Screen: Low Risk  (07/24/2024)  Financial Resource Strain: Low Risk  (07/24/2024)  Tobacco Use: Medium Risk (08/07/2024)     Readmission Risk Interventions    07/25/2024    2:07 PM  Readmission Risk Prevention Plan  Medication Screening Complete  Transportation Screening Complete

## 2024-08-13 NOTE — Progress Notes (Signed)
 Discharge Nurse Summary: DC order noted per MD. DC RN at bedside with patient. Patient agreeable with discharge plan, agreeable to transport to dc lounge for bus pass transport home. AVS printed/reviewed. PIVs removed. Telemonitor returned to charging station. No DME/TOC meds. CP/Edu resolved. Patient dressed and ready to go. All belongings accounted for. NT wheeled patient downstairs to dc lounge.  Rosario EMERSON Lund, RN

## 2024-08-20 ENCOUNTER — Telehealth (HOSPITAL_COMMUNITY): Payer: Self-pay | Admitting: *Deleted

## 2024-08-20 NOTE — Progress Notes (Signed)
 ADV[facility]ED [facility] CLINIC CONSULT NOTE  Referring Physician: Leontine Cramp, NP Primary Care: Leontine Cramp, NP Primary Cardiologist: Darryle ONEIDA Decent, MD HF Cardiologist: Dr. Zenaida  HPI: Benjamin Abbott is a 63 y.o. male with history of  asthma, arthritis, and HTN. Newly diagnosed with HFrEF and persistent atrial fibrillation.  Admitted 10/25 to [facility] with new diagnosis heart failure and atrial fibrillation with RVR. He presented with bilateral lower extremity edema and abdominal pain, that started about a month ago. Echo showed EF 25-30%, mod to severe MR/TR. R/LHC showed severely reduced cardiac index and mildly elevated filling pressures, without significant CAD. Decision made not to start inotrope therapy given stable end organ perfusion, vitals and normal lactic acid. He underwent TEE/DCCV with ERAF on 07/29/24. He was loaded with amio and attempted DCCV again on 08/01/24 which unfortunately failed. GDMT was titrated and he was discharged at 85.1 kg  Admitted 11/25 with large bowel obstruction. Required NG tube and aggressive bowel regimen. GI following. Discharged on daily MiraLAX  and tolerating POs at d/c. Also with AKI in the setting of poor PO intake.   Today he presents for post hospital follow up. Overall feeling good. Denies palpitations, CP, dizziness, edema, or PND/Orthopnea. No SOB. Appetite ok. No fever or chills. Weight at home 178 pounds. Taking all medications. Denies ETOH, tobacco or drug use.   Spoke with NP that saw him yesterday from PCP clinic. Passed along that SCr up to 1.92, GFR in 30s, Na 130 and CO2 14.   Past Medical History:  Diagnosis Date   Arthritis    Asthma    Back pain    Chronic pain    Colon cancer (HCC)    COPD (chronic obstructive pulmonary disease) (HCC)    Dyspnea    with exertion   GERD (gastroesophageal reflux disease)    Hypertension    Knee pain    Neuropathy    Obesity    Sleep apnea    cpap    Current Outpatient Medications  Medication Sig  Dispense Refill   albuterol  (PROVENTIL  HFA;VENTOLIN  HFA) 108 (90 Base) MCG/ACT inhaler Inhale 2 puffs into the lungs every 6 (six) hours as needed for wheezing or shortness of breath. 18 g 1   albuterol  (PROVENTIL ) (2.5 MG/3ML) 0.083% nebulizer solution Take 2.5 mg by nebulization every 6 (six) hours as needed for wheezing or shortness of breath.     amiodarone  (PACERONE ) 200 MG tablet Take 1 tablet (200 mg total) by mouth 2 (two) times daily. 120 tablet 0   apixaban  (ELIQUIS ) 5 MG TABS tablet Take 1 tablet (5 mg total) by mouth 2 (two) times daily. 120 tablet 0   cetirizine (ZYRTEC) 10 MG tablet Take 10 mg by mouth daily.     cyclobenzaprine  (FLEXERIL ) 10 MG tablet Take 10 mg by mouth at bedtime as needed for muscle spasms.     digoxin  (LANOXIN ) 0.125 MG tablet Take 1 tablet (0.125 mg total) by mouth daily. 60 tablet 0   empagliflozin  (JARDIANCE ) 10 MG TABS tablet Take 1 tablet (10 mg total) by mouth daily. 60 tablet 0   fluticasone  (FLONASE ) 50 MCG/ACT nasal spray Place 1 spray into both nostrils daily as needed for allergies.     Fluticasone -Salmeterol (ADVAIR) 250-50 MCG/DOSE AEPB Inhale 1 puff into the lungs 2 (two) times daily. 14 each 1   furosemide  (LASIX ) 40 MG tablet Take 1 tablet (40 mg total) by mouth daily. 60 tablet 0   gabapentin  (NEURONTIN ) 300 MG capsule Take 1 capsule (  300 mg total) by mouth 3 (three) times daily. 90 capsule 0   hydrOXYzine  (ATARAX ) 25 MG tablet Take 25 mg by mouth at bedtime.     montelukast  (SINGULAIR ) 10 MG tablet Take 10 mg by mouth daily as needed (allergies).     ondansetron  (ZOFRAN ) 4 MG tablet Take 1 tablet (4 mg total) by mouth every 8 (eight) hours as needed for nausea or vomiting. 20 tablet 0   polyethylene glycol (MIRALAX  / GLYCOLAX ) 17 g packet Take 17 g by mouth daily. 30 each 0   sacubitril -valsartan  (ENTRESTO ) 24-26 MG Take 1 tablet by mouth 2 (two) times daily. 120 tablet 0   spironolactone  (ALDACTONE ) 25 MG tablet Take 1 tablet (25 mg total) by  mouth daily. 60 tablet 0   triamcinolone cream (KENALOG) 0.1 % Apply 1 Application topically daily as needed (for rash).     Cholecalciferol  (VITAMIN D ) 50 MCG (2000 UT) tablet Take 2,000 Units by mouth daily. (Patient not taking: Reported on 08/21/2024)     No current facility-administered medications for this encounter.    Allergies  Allergen Reactions   Gadolinium Derivatives Shortness Of Breath    Bronchospasms-Pt asthmatic and had albuterol  inhaler, which he used and it broke the spasms. Will need 13 hour prep for furture MRI lillie 06/17/24-AG,RN      Social History   Socioeconomic History   Marital status: Single    Spouse name: Not on file   Number of children: 1   Years of education: Not on file   Highest education level: High school graduate  Occupational History   Occupation: Disabilty  Tobacco Use   Smoking status: Former    Current packs/day: 0.00    Average packs/day: 0.5 packs/day for 36.5 years (18.2 ttl pk-yrs)    Types: Cigarettes    Start date: 78    Quit date: 03/18/2020    Years since quitting: 4.4   Smokeless tobacco: Never  Vaping Use   Vaping status: Never Used  Substance and Sexual Activity   Alcohol use: Not Currently    Alcohol/week: 3.0 standard drinks of alcohol    Types: 3 Cans of beer per week    Comment: occ beer   Drug use: Not Currently    Types: Marijuana    Comment: daily   Sexual activity: Yes  Other Topics Concern   Not on file  Social History Narrative   Not on file   Social Drivers of Health   Financial Resource Strain: Low Risk  (07/24/2024)   Overall Financial Resource Strain (CARDIA)    Difficulty of Paying Living Expenses: Not very hard  Food Insecurity: No Food Insecurity (08/07/2024)   Hunger Vital Sign    Worried About Running Out of Food in the Last Year: Never true    Ran Out of Food in the Last Year: Never true  Transportation Needs: No Transportation Needs (08/07/2024)   PRAPARE - Doctor, General Practice (Medical): No    Lack of Transportation (Non-Medical): No  Physical Activity: Not on file  Stress: Not on file  Social Connections: Not on file  Intimate Partner Violence: Not At Risk (08/07/2024)   Humiliation, Afraid, Rape, and Kick questionnaire    Fear of Current or Ex-Partner: No    Emotionally Abused: No    Physically Abused: No    Sexually Abused: No      Family History  Problem Relation Age of Onset   Heart attack Mother    Heart  attack Father     Vitals:   08/21/24 0908  BP: 128/88  Pulse: 69  SpO2: 99%  Weight: 81 kg (178 lb 9.6 oz)    Filed Weights   08/21/24 0908  Weight: 81 kg (178 lb 9.6 oz)    PHYSICAL EXAM: General:  well appearing.  No respiratory difficulty. Walked into clinic Neck: JVD flat.  Cor: Regular rate & rhythm. No murmurs. Lungs: clear Extremities: no edema  Neuro: alert & oriented x 3. Affect pleasant.   ECG (personally reviewed): NSR 65 bpm   ASSESSMENT & PLAN:  Chronic HFrEF: EF 25-30%, nl RV. 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  Suspect etiology related to tachyarrhythmia (AF), though heavy alcohol use could also be considered. Cath with severely reduced CI of 1.5, PAPi 1.2. His endorgan function remained stable. - NYHA II. Volume low to euvolemic on exam.  - Hold Lasix  40 mg PO daily x1 week - Continue digoxin  0.125 daily.  - Continue Entresto  24/26 bid x1 week - Hold Jardiance  10 daily. - Hold spironolactone  to 25 daily x1 week - Hold on BB - Labs reviewed from PCP. SCr up to 1.9. Holding meds as above. Will restart in 1 week. BMET/BNP/ Digoxin  level in 1 week. (Will make note for no digoxin  morning of labs)   Persistent atrial fibrillation: HR 69 - 11/3  TEE/DCCV failed Back on Amiodarone .  - Reattempt cardioversion 11/6 on IV amio -> failed - In NSR today - Continue amio 200 bid  - Consider eventual AF ablation.    Mitral Regurgitation: Mod to severe on  echo - suspect functional   Substance use: - Prior THC, cocaine - Heavy alcohol use, denies any    HTN - GDMT as above - BP stable  Pill box provided today.   Follow up in 2 months with APP.   Beckey LITTIE Coe, NP 08/21/24

## 2024-08-20 NOTE — Telephone Encounter (Signed)
 Called to confirm/remind patient of their appointment at the [facility] on 08/21/24:       Appointment:              [] Confirmed             [x] Left mess              [] No answer/No voice mail             [] Phone not in service   Patient reminded to bring all medications and/or complete list.   Confirmed patient has transportation. Gave directions, instructed to utilize valet parking.

## 2024-08-21 ENCOUNTER — Ambulatory Visit (HOSPITAL_COMMUNITY)
Admission: RE | Admit: 2024-08-21 | Discharge: 2024-08-21 | Disposition: A | Discharge status: Home / Self Care | Source: Ambulatory Visit | Attending: Internal Medicine | Admitting: Internal Medicine

## 2024-08-21 ENCOUNTER — Encounter (HOSPITAL_COMMUNITY): Payer: Self-pay

## 2024-08-21 VITALS — BP 128/88 | HR 69 | Wt 178.6 lb

## 2024-08-21 DIAGNOSIS — I2510 Atherosclerotic heart disease of native coronary artery without angina pectoris: Secondary | ICD-10-CM | POA: Diagnosis not present

## 2024-08-21 DIAGNOSIS — Z7984 Long term (current) use of oral hypoglycemic drugs: Secondary | ICD-10-CM | POA: Insufficient documentation

## 2024-08-21 DIAGNOSIS — I10 Essential (primary) hypertension: Secondary | ICD-10-CM

## 2024-08-21 DIAGNOSIS — I340 Nonrheumatic mitral (valve) insufficiency: Secondary | ICD-10-CM | POA: Insufficient documentation

## 2024-08-21 DIAGNOSIS — I110 Hypertensive heart disease with heart failure: Secondary | ICD-10-CM | POA: Insufficient documentation

## 2024-08-21 DIAGNOSIS — I4819 Other persistent atrial fibrillation: Secondary | ICD-10-CM | POA: Insufficient documentation

## 2024-08-21 DIAGNOSIS — Z87891 Personal history of nicotine dependence: Secondary | ICD-10-CM | POA: Diagnosis not present

## 2024-08-21 DIAGNOSIS — I5022 Chronic systolic (congestive) heart failure: Secondary | ICD-10-CM | POA: Diagnosis not present

## 2024-08-21 DIAGNOSIS — I428 Other cardiomyopathies: Secondary | ICD-10-CM | POA: Insufficient documentation

## 2024-08-21 DIAGNOSIS — J4489 Other specified chronic obstructive pulmonary disease: Secondary | ICD-10-CM | POA: Diagnosis not present

## 2024-08-21 DIAGNOSIS — M1990 Unspecified osteoarthritis, unspecified site: Secondary | ICD-10-CM | POA: Diagnosis not present

## 2024-08-21 DIAGNOSIS — F1910 Other psychoactive substance abuse, uncomplicated: Secondary | ICD-10-CM | POA: Diagnosis not present

## 2024-08-21 DIAGNOSIS — Z79899 Other long term (current) drug therapy: Secondary | ICD-10-CM | POA: Diagnosis not present

## 2024-08-21 NOTE — Patient Instructions (Signed)
 Hold your Lasix , Spiro, and Jardiance  for one week then re-start your daily doses. Labs in 1 week - see below. DO NOT TAKE YOUR DIGOXIN  THAT DAY BEFORE YOU GET YOUR LABS DRAWN. Return to [facility] Failure [facility] Clinic in 2 months - see below. Please call us  at [phone] if any questions or concerns prior to your next appointment.

## 2024-08-28 ENCOUNTER — Ambulatory Visit (HOSPITAL_COMMUNITY)
Admission: RE | Admit: 2024-08-28 | Discharge: 2024-08-28 | Disposition: A | Source: Ambulatory Visit | Attending: Cardiology

## 2024-08-28 DIAGNOSIS — I5022 Chronic systolic (congestive) heart failure: Secondary | ICD-10-CM

## 2024-08-28 LAB — DIGOXIN LEVEL: Digoxin Level: 0.8 ng/mL (ref 0.8–2.0)

## 2024-08-28 LAB — BASIC METABOLIC PANEL WITH GFR
Anion gap: 7 (ref 5–15)
BUN: 15 mg/dL (ref 8–23)
CO2: 24 mmol/L (ref 22–32)
Calcium: 9.4 mg/dL (ref 8.9–10.3)
Chloride: 109 mmol/L (ref 98–111)
Creatinine, Ser: 1.16 mg/dL (ref 0.61–1.24)
GFR, Estimated: >60 mL/min (ref >60)
Glucose, Bld: 91 mg/dL (ref 70–99)
Potassium: 5.2 mmol/L — ABNORMAL HIGH (ref 3.5–5.1)
Sodium: 140 mmol/L (ref 135–145)

## 2024-08-28 LAB — BRAIN NATRIURETIC PEPTIDE: B Natriuretic Peptide: 99.6 pg/mL (ref 0.0–100.0)

## 2024-08-29 ENCOUNTER — Ambulatory Visit (HOSPITAL_COMMUNITY): Payer: Self-pay | Admitting: Internal Medicine

## 2024-08-29 DIAGNOSIS — I5022 Chronic systolic (congestive) heart failure: Secondary | ICD-10-CM

## 2024-09-05 ENCOUNTER — Ambulatory Visit (HOSPITAL_COMMUNITY)
Admission: RE | Admit: 2024-09-05 | Discharge: 2024-09-05 | Disposition: A | Discharge status: Home / Self Care | Source: Ambulatory Visit | Attending: Internal Medicine | Admitting: Internal Medicine

## 2024-09-05 ENCOUNTER — Ambulatory Visit (HOSPITAL_COMMUNITY): Payer: Self-pay | Admitting: Internal Medicine

## 2024-09-05 ENCOUNTER — Encounter (HOSPITAL_COMMUNITY): Payer: Self-pay

## 2024-09-05 DIAGNOSIS — I5022 Chronic systolic (congestive) heart failure: Secondary | ICD-10-CM

## 2024-09-05 LAB — BASIC METABOLIC PANEL WITH GFR
Anion gap: 10 (ref 5–15)
BUN: 14 mg/dL (ref 8–23)
CO2: 19 mmol/L — ABNORMAL LOW (ref 22–32)
Calcium: 9 mg/dL (ref 8.9–10.3)
Chloride: 109 mmol/L (ref 98–111)
Creatinine, Ser: 1.29 mg/dL — ABNORMAL HIGH (ref 0.61–1.24)
GFR, Estimated: >60 mL/min (ref >60)
Glucose, Bld: 120 mg/dL — ABNORMAL HIGH (ref 70–99)
Potassium: 4.8 mmol/L (ref 3.5–5.1)
Sodium: 138 mmol/L (ref 135–145)

## 2024-09-05 NOTE — Addendum Note (Signed)
 Encounter addended by: Micael Sueanne LABOR, CMA on: 09/05/2024 9:58 AM  Actions taken: Child order released for a procedure order

## 2024-10-21 ENCOUNTER — Ambulatory Visit (HOSPITAL_COMMUNITY)

## 2024-10-22 NOTE — Progress Notes (Incomplete)
 "  ADVANCED HF CLINIC CONSULT NOTE  Referring Physician: Leontine Cramp, NP Primary Care: Leontine Cramp, NP Primary Cardiologist: Darryle ONEIDA Decent, MD HF Cardiologist: Dr. Zenaida  HPI: Benjamin Abbott is a 64 y.o. male with history of  asthma, arthritis, and HTN. Newly diagnosed with HFrEF and persistent atrial fibrillation.  Admitted 10/25 to [facility] with new diagnosis heart failure and atrial fibrillation with RVR. He presented with bilateral lower extremity edema and abdominal pain, that started about a month ago. Echo showed EF 25-30%, mod to severe MR/TR. R/LHC showed severely reduced cardiac index and mildly elevated filling pressures, without significant CAD. Decision made not to start inotrope therapy given stable end organ perfusion, vitals and normal lactic acid. He underwent TEE/DCCV with ERAF on 07/29/24. He was loaded with amio and attempted DCCV again on 08/01/24 which unfortunately failed. GDMT was titrated and he was discharged at 85.1 kg  Admitted 11/25 with large bowel obstruction. Required NG tube and aggressive bowel regimen. GI following. Discharged on daily MiraLAX  and tolerating POs at d/c. Also with AKI in the setting of poor PO intake.   11/25: Today he presents for post hospital follow up. Overall feeling good. Denies palpitations, CP, dizziness, edema, or PND/Orthopnea. No SOB. Appetite ok. No fever or chills. Weight at home 178 pounds. Taking all medications. Denies ETOH, tobacco or drug use.   Spoke with NP that saw him yesterday from PCP clinic. Passed along that SCr up to 1.92, GFR in 30s, Na 130 and CO2 14.    He returns today for HF follow up. Overall feeling ***. NYHA ***. Reports {Symptoms; cardiac:12860::dyspnea,fatigue}. Denies {Symptoms; cardiac:12860::chest pain,dyspnea,fatigue,near-syncope,orthopnea,palpitations,dizziness,abnormal bleeding}. Able to perform ADLs. Appetite okay. Weight at home ***. BP at home***. Compliant with all  medications.    Past Medical History:  Diagnosis Date   Arthritis    Asthma    Back pain    Chronic pain    Colon cancer (HCC)    COPD (chronic obstructive pulmonary disease) (HCC)    Dyspnea    with exertion   GERD (gastroesophageal reflux disease)    Hypertension    Knee pain    Neuropathy    Obesity    Sleep apnea    cpap    Current Outpatient Medications  Medication Sig Dispense Refill   albuterol  (PROVENTIL  HFA;VENTOLIN  HFA) 108 (90 Base) MCG/ACT inhaler Inhale 2 puffs into the lungs every 6 (six) hours as needed for wheezing or shortness of breath. 18 g 1   albuterol  (PROVENTIL ) (2.5 MG/3ML) 0.083% nebulizer solution Take 2.5 mg by nebulization every 6 (six) hours as needed for wheezing or shortness of breath.     amiodarone  (PACERONE ) 200 MG tablet Take 1 tablet (200 mg total) by mouth 2 (two) times daily. 120 tablet 0   apixaban  (ELIQUIS ) 5 MG TABS tablet Take 1 tablet (5 mg total) by mouth 2 (two) times daily. 120 tablet 0   cetirizine (ZYRTEC) 10 MG tablet Take 10 mg by mouth daily.     Cholecalciferol  (VITAMIN D ) 50 MCG (2000 UT) tablet Take 2,000 Units by mouth daily. (Patient not taking: Reported on 08/21/2024)     cyclobenzaprine  (FLEXERIL ) 10 MG tablet Take 10 mg by mouth at bedtime as needed for muscle spasms.     digoxin  (LANOXIN ) 0.125 MG tablet Take 1 tablet (0.125 mg total) by mouth daily. 60 tablet 0   empagliflozin  (JARDIANCE ) 10 MG TABS tablet Take 1 tablet (10 mg total) by mouth daily. 60 tablet 0   fluticasone  (  FLONASE ) 50 MCG/ACT nasal spray Place 1 spray into both nostrils daily as needed for allergies.     Fluticasone -Salmeterol (ADVAIR) 250-50 MCG/DOSE AEPB Inhale 1 puff into the lungs 2 (two) times daily. 14 each 1   furosemide  (LASIX ) 40 MG tablet Take 1 tablet (40 mg total) by mouth daily. 60 tablet 0   gabapentin  (NEURONTIN ) 300 MG capsule Take 1 capsule (300 mg total) by mouth 3 (three) times daily. 90 capsule 0   hydrOXYzine  (ATARAX ) 25 MG  tablet Take 25 mg by mouth at bedtime.     montelukast  (SINGULAIR ) 10 MG tablet Take 10 mg by mouth daily as needed (allergies).     ondansetron  (ZOFRAN ) 4 MG tablet Take 1 tablet (4 mg total) by mouth every 8 (eight) hours as needed for nausea or vomiting. 20 tablet 0   polyethylene glycol (MIRALAX  / GLYCOLAX ) 17 g packet Take 17 g by mouth daily. 30 each 0   sacubitril -valsartan  (ENTRESTO ) 24-26 MG Take 1 tablet by mouth 2 (two) times daily. 120 tablet 0   spironolactone  (ALDACTONE ) 25 MG tablet Take 1 tablet (25 mg total) by mouth daily. 60 tablet 0   triamcinolone cream (KENALOG) 0.1 % Apply 1 Application topically daily as needed (for rash).     No current facility-administered medications for this visit.    Allergies  Allergen Reactions   Gadolinium Derivatives Shortness Of Breath    Bronchospasms-Pt asthmatic and had albuterol  inhaler, which he used and it broke the spasms. Will need 13 hour prep for furture MRI lillie 06/17/24-AG,RN      Social History   Socioeconomic History   Marital status: Single    Spouse name: Not on file   Number of children: 1   Years of education: Not on file   Highest education level: High school graduate  Occupational History   Occupation: Disabilty  Tobacco Use   Smoking status: Former    Current packs/day: 0.00    Average packs/day: 0.5 packs/day for 36.5 years (18.2 ttl pk-yrs)    Types: Cigarettes    Start date: 29    Quit date: 03/18/2020    Years since quitting: 4.6   Smokeless tobacco: Never  Vaping Use   Vaping status: Never Used  Substance and Sexual Activity   Alcohol use: Not Currently    Alcohol/week: 3.0 standard drinks of alcohol    Types: 3 Cans of beer per week    Comment: occ beer   Drug use: Not Currently    Types: Marijuana    Comment: daily   Sexual activity: Yes  Other Topics Concern   Not on file  Social History Narrative   Not on file   Social Drivers of Health   Tobacco Use: Medium Risk (08/21/2024)    Patient History    Smoking Tobacco Use: Former    Smokeless Tobacco Use: Never    Passive Exposure: Not on Actuary Strain: Low Risk (07/24/2024)   Overall Financial Resource Strain (CARDIA)    Difficulty of Paying Living Expenses: Not very hard  Food Insecurity: No Food Insecurity (08/07/2024)   Epic    Worried About Programme Researcher, Broadcasting/film/video in the Last Year: Never true    Ran Out of Food in the Last Year: Never true  Transportation Needs: No Transportation Needs (08/07/2024)   Epic    Lack of Transportation (Medical): No    Lack of Transportation (Non-Medical): No  Physical Activity: Not on file  Stress: Not on file  Social Connections: Not on file  Intimate Partner Violence: Not At Risk (08/07/2024)   Epic    Fear of Current or Ex-Partner: No    Emotionally Abused: No    Physically Abused: No    Sexually Abused: No  Depression (PHQ2-9): Not on file  Alcohol Screen: Low Risk (07/24/2024)   Alcohol Screen    Last Alcohol Screening Score (AUDIT): 1  Housing: Low Risk (08/07/2024)   Epic    Unable to Pay for Housing in the Last Year: No    Number of Times Moved in the Last Year: 0    Homeless in the Last Year: No  Utilities: Not At Risk (08/07/2024)   Epic    Threatened with loss of utilities: No  Health Literacy: Not on file      Family History  Problem Relation Age of Onset   Heart attack Mother    Heart attack Father     There were no vitals filed for this visit.   There were no vitals filed for this visit.   PHYSICAL EXAM: General: *** appearing. No distress  Cardiac: JVP ~*** cm. No murmurs  Resp: Lung sounds clear and equal B/L Abdomen: Soft, non-distended.  Extremities: Warm and dry.  *** edema.  Neuro: A&O x3. Affect pleasant.   ECG (personally reviewed): NSR 65 bpm   ASSESSMENT & PLAN:  Chronic HFrEF: EF 25-30%, nl RV. 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  Suspect etiology related to  tachyarrhythmia (AF), though heavy alcohol use could also be considered. Cath with severely reduced CI of 1.5, PAPi 1.2. His endorgan function remained stable. - NYHA II. Volume low to euvolemic on exam.  - Continue Lasix  40 mg daily PRN - Continue digoxin  0.0625 daily. - Continue Entresto  24/26 bid x1 week - Hold Jardiance  10 daily. - Off spiro with hyperkalemia - Hold on BB - Labs reviewed from PCP. SCr up to 1.9. Holding meds as above. Will restart in 1 week. BMET/BNP/ Digoxin  level in 1 week. (Will make note for no digoxin  morning of labs) - Repeat echo, possible ICD. Not CRT-D candidate with narrow QRS   Persistent atrial fibrillation: HR 69 - 11/3  TEE/DCCV failed Back on Amiodarone .  - Reattempt cardioversion 11/6 on IV amio -> failed - In NSR today - Continue amio 200 bid  - Consider eventual AF ablation.    Mitral Regurgitation: Mod to severe on echo - suspect functional   Substance use: - Prior THC, cocaine - Heavy alcohol use, denies any    HTN - GDMT as above - BP stable  Pill box provided today.   Follow up in 2 months with APP.   Follow up in *** with ***  Loleta Frommelt, NP 10/22/24   "

## 2024-10-24 ENCOUNTER — Ambulatory Visit (HOSPITAL_COMMUNITY)

## 2024-10-25 ENCOUNTER — Encounter (HOSPITAL_COMMUNITY): Payer: Self-pay | Admitting: Cardiology

## 2024-10-25 ENCOUNTER — Ambulatory Visit (HOSPITAL_COMMUNITY): Payer: Self-pay | Admitting: Cardiology

## 2024-10-25 ENCOUNTER — Ambulatory Visit (HOSPITAL_COMMUNITY)
Admission: RE | Admit: 2024-10-25 | Discharge: 2024-10-25 | Disposition: A | Source: Ambulatory Visit | Attending: Cardiology | Admitting: Cardiology

## 2024-10-25 VITALS — BP 141/91 | HR 79 | Ht 69.0 in | Wt 210.8 lb

## 2024-10-25 DIAGNOSIS — I110 Hypertensive heart disease with heart failure: Secondary | ICD-10-CM | POA: Diagnosis not present

## 2024-10-25 DIAGNOSIS — J4489 Other specified chronic obstructive pulmonary disease: Secondary | ICD-10-CM | POA: Diagnosis not present

## 2024-10-25 DIAGNOSIS — I5022 Chronic systolic (congestive) heart failure: Secondary | ICD-10-CM | POA: Diagnosis present

## 2024-10-25 DIAGNOSIS — Z7984 Long term (current) use of oral hypoglycemic drugs: Secondary | ICD-10-CM | POA: Insufficient documentation

## 2024-10-25 DIAGNOSIS — I4891 Unspecified atrial fibrillation: Secondary | ICD-10-CM | POA: Diagnosis not present

## 2024-10-25 DIAGNOSIS — Z87891 Personal history of nicotine dependence: Secondary | ICD-10-CM | POA: Insufficient documentation

## 2024-10-25 DIAGNOSIS — Z79899 Other long term (current) drug therapy: Secondary | ICD-10-CM | POA: Insufficient documentation

## 2024-10-25 DIAGNOSIS — M1990 Unspecified osteoarthritis, unspecified site: Secondary | ICD-10-CM | POA: Diagnosis not present

## 2024-10-25 DIAGNOSIS — I340 Nonrheumatic mitral (valve) insufficiency: Secondary | ICD-10-CM | POA: Insufficient documentation

## 2024-10-25 DIAGNOSIS — I428 Other cardiomyopathies: Secondary | ICD-10-CM | POA: Insufficient documentation

## 2024-10-25 DIAGNOSIS — F1091 Alcohol use, unspecified, in remission: Secondary | ICD-10-CM | POA: Insufficient documentation

## 2024-10-25 DIAGNOSIS — I2510 Atherosclerotic heart disease of native coronary artery without angina pectoris: Secondary | ICD-10-CM | POA: Insufficient documentation

## 2024-10-25 LAB — COMPREHENSIVE METABOLIC PANEL WITH GFR
ALT: 25 U/L (ref 0–44)
AST: 30 U/L (ref 15–41)
Albumin: 4.5 g/dL (ref 3.5–5.0)
Alkaline Phosphatase: 85 U/L (ref 38–126)
Anion gap: 12 (ref 5–15)
BUN: 26 mg/dL — ABNORMAL HIGH (ref 8–23)
CO2: 23 mmol/L (ref 22–32)
Calcium: 9.5 mg/dL (ref 8.9–10.3)
Chloride: 99 mmol/L (ref 98–111)
Creatinine, Ser: 1.38 mg/dL — ABNORMAL HIGH (ref 0.61–1.24)
GFR, Estimated: 58 mL/min — ABNORMAL LOW
Glucose, Bld: 104 mg/dL — ABNORMAL HIGH (ref 70–99)
Potassium: 4.7 mmol/L (ref 3.5–5.1)
Sodium: 134 mmol/L — ABNORMAL LOW (ref 135–145)
Total Bilirubin: 0.3 mg/dL (ref 0.0–1.2)
Total Protein: 8.2 g/dL — ABNORMAL HIGH (ref 6.5–8.1)

## 2024-10-25 LAB — DIGOXIN LEVEL: Digoxin Level: 0.7 ng/mL — ABNORMAL LOW (ref 0.8–2.0)

## 2024-10-25 LAB — TSH: TSH: 1.36 u[IU]/mL (ref 0.350–4.500)

## 2024-10-25 MED ORDER — AMIODARONE HCL 200 MG PO TABS: 200.0000 mg | ORAL_TABLET | Freq: Every day | ORAL | 11 refills | Status: AC

## 2024-10-25 MED ORDER — SACUBITRIL-VALSARTAN 49-51 MG PO TABS: 1.0000 | ORAL_TABLET | Freq: Two times a day (BID) | ORAL | 11 refills | Status: AC

## 2024-10-25 NOTE — Progress Notes (Signed)
 "  ADVANCED HF CLINIC PROGRESS NOTE  Referring Physician: Leontine Cramp, NP Primary Care: Leontine Cramp, NP Primary Cardiologist: Darryle ONEIDA Decent, MD HF Cardiologist: Dr. Zenaida  Reason for visit: f/u for systolic heart failure   HPI: Benjamin Abbott is a 64 y.o. male with history of  asthma, arthritis, and HTN. Newly diagnosed with HFrEF and persistent atrial fibrillation.  Admitted 10/25 to [facility] with new diagnosis heart failure and atrial fibrillation with RVR. He presented with bilateral lower extremity edema and abdominal pain, that started about a month ago. Echo showed EF 25-30%, mod to severe MR/TR. R/LHC showed severely reduced cardiac index and mildly elevated filling pressures, without significant CAD. Decision made not to start inotrope therapy given stable end organ perfusion, vitals and normal lactic acid. He underwent TEE/DCCV with ERAF on 07/29/24. He was loaded with amio and attempted DCCV again on 08/01/24 which unfortunately failed. GDMT was titrated and he was discharged at 85.1 kg  Admitted 11/25 with large bowel obstruction. Required NG tube and aggressive bowel regimen. GI following. Discharged on daily MiraLAX  and tolerating POs at d/c. Also with AKI in the setting of poor PO intake.   He presents today for f/u. Feels ok. Denies resting dyspnea. No significant dyspnea w/ ADLs. No chest pain. No LEE. Compliant w/ meds. No side effects. BP elevated 141/91. Compliant w/ CPAP. EKG shows NSR 74 bpm. He denies any drug use.   ReDs 30%, normal.    Past Medical History:  Diagnosis Date   Arthritis    Asthma    Back pain    Chronic pain    Colon cancer (HCC)    COPD (chronic obstructive pulmonary disease) (HCC)    Dyspnea    with exertion   GERD (gastroesophageal reflux disease)    Hypertension    Knee pain    Neuropathy    Obesity    Sleep apnea    cpap    Current Outpatient Medications  Medication Sig Dispense Refill   albuterol  (PROVENTIL  HFA;VENTOLIN  HFA) 108 (90  Base) MCG/ACT inhaler Inhale 2 puffs into the lungs every 6 (six) hours as needed for wheezing or shortness of breath. 18 g 1   albuterol  (PROVENTIL ) (2.5 MG/3ML) 0.083% nebulizer solution Take 2.5 mg by nebulization every 6 (six) hours as needed for wheezing or shortness of breath.     amiodarone  (PACERONE ) 200 MG tablet Take 1 tablet (200 mg total) by mouth 2 (two) times daily. 120 tablet 0   apixaban  (ELIQUIS ) 5 MG TABS tablet Take 1 tablet (5 mg total) by mouth 2 (two) times daily. 120 tablet 0   cetirizine (ZYRTEC) 10 MG tablet Take 10 mg by mouth daily.     Cholecalciferol  (VITAMIN D ) 50 MCG (2000 UT) tablet Take 2,000 Units by mouth daily.     cyclobenzaprine  (FLEXERIL ) 10 MG tablet Take 10 mg by mouth at bedtime as needed for muscle spasms.     digoxin  (LANOXIN ) 0.125 MG tablet Take 1 tablet (0.125 mg total) by mouth daily. 60 tablet 0   empagliflozin  (JARDIANCE ) 10 MG TABS tablet Take 1 tablet (10 mg total) by mouth daily. 60 tablet 0   fluticasone  (FLONASE ) 50 MCG/ACT nasal spray Place 1 spray into both nostrils daily as needed for allergies.     Fluticasone -Salmeterol (ADVAIR) 250-50 MCG/DOSE AEPB Inhale 1 puff into the lungs 2 (two) times daily. 14 each 1   furosemide  (LASIX ) 40 MG tablet Take 1 tablet (40 mg total) by mouth daily. 60 tablet 0  gabapentin  (NEURONTIN ) 300 MG capsule Take 1 capsule (300 mg total) by mouth 3 (three) times daily. 90 capsule 0   hydrOXYzine  (ATARAX ) 25 MG tablet Take 25 mg by mouth at bedtime.     montelukast  (SINGULAIR ) 10 MG tablet Take 10 mg by mouth daily as needed (allergies).     ondansetron  (ZOFRAN ) 4 MG tablet Take 1 tablet (4 mg total) by mouth every 8 (eight) hours as needed for nausea or vomiting. 20 tablet 0   polyethylene glycol (MIRALAX  / GLYCOLAX ) 17 g packet Take 17 g by mouth daily. 30 each 0   sacubitril -valsartan  (ENTRESTO ) 24-26 MG Take 1 tablet by mouth 2 (two) times daily. 120 tablet 0   spironolactone  (ALDACTONE ) 25 MG tablet Take 1  tablet (25 mg total) by mouth daily. 60 tablet 0   triamcinolone cream (KENALOG) 0.1 % Apply 1 Application topically daily as needed (for rash).     No current facility-administered medications for this encounter.    Allergies  Allergen Reactions   Gadolinium Derivatives Shortness Of Breath    Bronchospasms-Pt asthmatic and had albuterol  inhaler, which he used and it broke the spasms. Will need 13 hour prep for furture MRI lillie 06/17/24-AG,RN      Social History   Socioeconomic History   Marital status: Single    Spouse name: Not on file   Number of children: 1   Years of education: Not on file   Highest education level: High school graduate  Occupational History   Occupation: Disabilty  Tobacco Use   Smoking status: Former    Current packs/day: 0.00    Average packs/day: 0.5 packs/day for 36.5 years (18.2 ttl pk-yrs)    Types: Cigarettes    Start date: 18    Quit date: 03/18/2020    Years since quitting: 4.6   Smokeless tobacco: Never  Vaping Use   Vaping status: Never Used  Substance and Sexual Activity   Alcohol use: Not Currently    Alcohol/week: 3.0 standard drinks of alcohol    Types: 3 Cans of beer per week    Comment: occ beer   Drug use: Not Currently    Types: Marijuana    Comment: daily   Sexual activity: Yes  Other Topics Concern   Not on file  Social History Narrative   Not on file   Social Drivers of Health   Tobacco Use: Medium Risk (10/25/2024)   Patient History    Smoking Tobacco Use: Former    Smokeless Tobacco Use: Never    Passive Exposure: Not on Actuary Strain: Low Risk (07/24/2024)   Overall Financial Resource Strain (CARDIA)    Difficulty of Paying Living Expenses: Not very hard  Food Insecurity: No Food Insecurity (08/07/2024)   Epic    Worried About Programme Researcher, Broadcasting/film/video in the Last Year: Never true    Ran Out of Food in the Last Year: Never true  Transportation Needs: No Transportation Needs (08/07/2024)    Epic    Lack of Transportation (Medical): No    Lack of Transportation (Non-Medical): No  Physical Activity: Not on file  Stress: Not on file  Social Connections: Not on file  Intimate Partner Violence: Not At Risk (08/07/2024)   Epic    Fear of Current or Ex-Partner: No    Emotionally Abused: No    Physically Abused: No    Sexually Abused: No  Depression (PHQ2-9): Not on file  Alcohol Screen: Low Risk (07/24/2024)   Alcohol Screen  Last Alcohol Screening Score (AUDIT): 1  Housing: Low Risk (08/07/2024)   Epic    Unable to Pay for Housing in the Last Year: No    Number of Times Moved in the Last Year: 0    Homeless in the Last Year: No  Utilities: Not At Risk (08/07/2024)   Epic    Threatened with loss of utilities: No  Health Literacy: Not on file      Family History  Problem Relation Age of Onset   Heart attack Mother    Heart attack Father     Vitals:   10/25/24 1327  BP: (!) 141/91  Pulse: 79  SpO2: 96%  Weight: 95.6 kg (210 lb 12.8 oz)  Height: 5' 9 (1.753 m)     Filed Weights   10/25/24 1327  Weight: 95.6 kg (210 lb 12.8 oz)     PHYSICAL EXAM: ReDs 30%, normal  General: well appearing. No distress  Cardiac: JVP not. No murmurs  Resp: Lung sounds clear and equal B/L Abdomen: Soft, non-distended.  Extremities: Warm and dry.  No edema.  Neuro: A&O x3. Affect pleasant.   ECG (personally reviewed): NSR 72 bpm    ASSESSMENT & PLAN:  Chronic HFrEF: EF 25-30%, nl RV. 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  Suspect etiology related to tachyarrhythmia (AF), though heavy alcohol use could also be considered. He denies any further heavy use of ETOH - NYHA Class I-II. Euvolemic on exam and by ReDS  - Continue Lasix  40 mg daily  - Increase Entersto to 49-51 mg bid - Continue Jardiance  10 mg daily  - Continue spironolactone  25 mg daily  - Continue digoxin  0.0625 mg daily. Check dig level  - Check BMP  - Update  Echo     Atrial fibrillation: HR 69 - 11/3  TEE/DCCV failed Back on Amiodarone .  - Reattempt cardioversion 11/6 on IV amio -> failed - He is back in NSR today. Was in NSR last OV 11/25 - Reduce Amiodarone  down to 200 mg once daily. Check TSH and HFTs today  - Imperative to keep in NSR as we suspect this contributed to HF. Refer to EP for consideration for Afib Ablation. May also need ICD if EF < 35%  - He is compliant w/ CPAP, encouraged to continue   Mitral Regurgitation: Mod to severe on echo 10/25 - suspect functional  - he has been optimized from HF/Afib standpoint - will update echo   Substance use: - Prior THC, cocaine - Heavy alcohol use in the past - denies any recent substance use    HTN - mildly elevated, updating HF GDMT per above   F/u w/ Dr. Zenaida in 2 months   Caffie Shed, PA-C 10/25/24   "

## 2024-10-25 NOTE — Addendum Note (Signed)
 Encounter addended by: Alvan Fausto SAUNDERS, CMA on: 10/25/2024 2:19 PM  Actions taken: Clinical Note Signed

## 2024-10-25 NOTE — Addendum Note (Signed)
 Encounter addended by: Dante Jeannine HERO, CMA on: 10/25/2024 3:59 PM  Actions taken: Order list changed, Diagnosis association updated, Flowsheet accepted, Clinical Note Signed, Charge Capture section accepted

## 2024-10-25 NOTE — Progress Notes (Signed)
"   ReDS Vest / Clip - 10/25/24 1500       ReDS Vest / Clip   Station Marker D    Ruler Value 32.5    ReDS Value Range Low volume    ReDS Actual Value 30          "

## 2024-10-25 NOTE — Patient Instructions (Addendum)
 Labs done today. We will contact you only if your labs are abnormal.  DECREASE Amiodarone  to 200mg  (1 tablet) by mouth 1 daily.   INCREASE Entresto  to 49-51mg  (1 tablet) by mouth 2 times daily.   No other medication changes were made. Please continue all current medications as prescribed.  You have been referred to Electrophysiology. They will contact you to schedule an appointment.   Your physician recommends that you schedule a follow-up appointment in: 2 months with Dr. Zenaida with an echo prior to your appointment  Your physician has requested that you have an echocardiogram. Echocardiography is a painless test that uses sound waves to create images of your heart. It provides your doctor with information about the size and shape of your heart and how well your hearts chambers and valves are working. This procedure takes approximately one hour. There are no restrictions for this procedure. Please do NOT wear cologne, perfume, aftershave, or lotions (deodorant is allowed). Please arrive 15 minutes prior to your appointment time.  Please note: We ask at that you not bring children with you during ultrasound (echo/ vascular) testing. Due to room size and safety concerns, children are not allowed in the ultrasound rooms during exams. Our front office staff cannot provide observation of children in our lobby area while testing is being conducted. An adult accompanying a patient to their appointment will only be allowed in the ultrasound room at the discretion of the ultrasound technician under special circumstances. We apologize for any inconvenience.  If you have any questions or concerns before your next appointment please send us  a message through [place] or call our office at [phone].    TO LEAVE A MESSAGE FOR THE NURSE SELECT OPTION 2, PLEASE LEAVE A MESSAGE INCLUDING: YOUR NAME DATE OF BIRTH CALL BACK NUMBER REASON FOR CALL**this is important as we prioritize the call backs  YOU  WILL RECEIVE A CALL BACK THE SAME DAY AS LONG AS YOU CALL BEFORE 4:00 PM   Do the following things EVERYDAY: Weigh yourself in the morning before breakfast. Write it down and keep it in a log. Take your medicines as prescribed Eat low salt foods--Limit salt (sodium) to 2000 mg per day.  Stay as active as you can everyday Limit all fluids for the day to less than 2 liters   At the [facility], you and your health needs are our priority. As part of our continuing mission to provide you with exceptional heart care, we have created designated Provider Care Teams. These Care Teams include your primary Cardiologist (physician) and Advanced Practice Providers (APPs- Physician Assistants and Nurse Practitioners) who all work together to provide you with the care you need, when you need it.   You may see any of the following providers on your designated Care Team at your next follow up: Dr Toribio Fuel Dr Ezra Shuck Dr. Morene Zenaida Greig Mosses, NP Caffie Shed, [place] [facility] Flowery Jamal Pavon, [place] Beckey Coe, NP [name], NP Ellouise Class, NP Tinnie Redman, PharmD Jaun Bash, PharmD   Please be sure to bring in all your medications bottles to every appointment.    Thank you for choosing  [facility]

## 2024-12-23 ENCOUNTER — Ambulatory Visit (HOSPITAL_COMMUNITY): Admitting: Cardiology

## 2024-12-23 ENCOUNTER — Other Ambulatory Visit (HOSPITAL_COMMUNITY)
